# Patient Record
Sex: Female | Born: 1965 | Race: Black or African American | Hispanic: No | Marital: Single | Smoking: Never smoker
Health system: Southern US, Community
[De-identification: ages and names within clinical notes are randomized; demographics above are authoritative.]

## PROBLEM LIST (undated history)

## (undated) ENCOUNTER — Emergency Department (HOSPITAL_COMMUNITY): Admission: EM

## (undated) DIAGNOSIS — R7303 Prediabetes: Secondary | ICD-10-CM

## (undated) DIAGNOSIS — M1990 Unspecified osteoarthritis, unspecified site: Secondary | ICD-10-CM

## (undated) DIAGNOSIS — K219 Gastro-esophageal reflux disease without esophagitis: Secondary | ICD-10-CM

## (undated) DIAGNOSIS — Z9289 Personal history of other medical treatment: Secondary | ICD-10-CM

## (undated) DIAGNOSIS — R002 Palpitations: Secondary | ICD-10-CM

## (undated) DIAGNOSIS — E785 Hyperlipidemia, unspecified: Secondary | ICD-10-CM

## (undated) DIAGNOSIS — I10 Essential (primary) hypertension: Secondary | ICD-10-CM

## (undated) DIAGNOSIS — E669 Obesity, unspecified: Secondary | ICD-10-CM

## (undated) DIAGNOSIS — G4730 Sleep apnea, unspecified: Secondary | ICD-10-CM

## (undated) DIAGNOSIS — F419 Anxiety disorder, unspecified: Secondary | ICD-10-CM

## (undated) HISTORY — DX: Prediabetes: R73.03

## (undated) HISTORY — PX: CHOLECYSTECTOMY: SHX55

## (undated) HISTORY — PX: CARDIAC SURGERY: SHX584

## (undated) HISTORY — DX: Personal history of other medical treatment: Z92.89

## (undated) HISTORY — DX: Obesity, unspecified: E66.9

## (undated) HISTORY — PX: DOPPLER ECHOCARDIOGRAPHY: SHX263

## (undated) HISTORY — DX: Palpitations: R00.2

---

## 2000-07-15 ENCOUNTER — Ambulatory Visit (HOSPITAL_COMMUNITY): Admission: RE | Admit: 2000-07-15 | Discharge: 2000-07-15 | Payer: Self-pay | Admitting: Obstetrics and Gynecology

## 2000-07-18 ENCOUNTER — Observation Stay (HOSPITAL_COMMUNITY): Admission: AD | Admit: 2000-07-18 | Discharge: 2000-07-18 | Payer: Self-pay | Admitting: *Deleted

## 2000-07-22 ENCOUNTER — Inpatient Hospital Stay (HOSPITAL_COMMUNITY): Admission: AD | Admit: 2000-07-22 | Discharge: 2000-07-25 | Payer: Self-pay | Admitting: Obstetrics and Gynecology

## 2000-07-28 ENCOUNTER — Emergency Department (HOSPITAL_COMMUNITY): Admission: EM | Admit: 2000-07-28 | Discharge: 2000-07-28 | Payer: Self-pay | Admitting: Emergency Medicine

## 2000-07-28 ENCOUNTER — Encounter: Payer: Self-pay | Admitting: Emergency Medicine

## 2000-08-14 ENCOUNTER — Emergency Department (HOSPITAL_COMMUNITY): Admission: EM | Admit: 2000-08-14 | Discharge: 2000-08-14 | Payer: Self-pay | Admitting: *Deleted

## 2000-12-09 ENCOUNTER — Emergency Department (HOSPITAL_COMMUNITY): Admission: EM | Admit: 2000-12-09 | Discharge: 2000-12-09 | Payer: Self-pay | Admitting: *Deleted

## 2001-07-10 ENCOUNTER — Encounter: Payer: Self-pay | Admitting: Obstetrics and Gynecology

## 2001-07-10 ENCOUNTER — Ambulatory Visit (HOSPITAL_COMMUNITY): Admission: RE | Admit: 2001-07-10 | Discharge: 2001-07-10 | Payer: Self-pay | Admitting: Internal Medicine

## 2001-11-25 ENCOUNTER — Ambulatory Visit (HOSPITAL_COMMUNITY): Admission: RE | Admit: 2001-11-25 | Discharge: 2001-11-25 | Payer: Self-pay | Admitting: Cardiology

## 2001-11-25 ENCOUNTER — Encounter: Payer: Self-pay | Admitting: Cardiology

## 2002-01-20 ENCOUNTER — Emergency Department (HOSPITAL_COMMUNITY): Admission: EM | Admit: 2002-01-20 | Discharge: 2002-01-20 | Payer: Self-pay | Admitting: *Deleted

## 2002-06-30 ENCOUNTER — Ambulatory Visit (HOSPITAL_COMMUNITY): Admission: RE | Admit: 2002-06-30 | Discharge: 2002-06-30 | Payer: Self-pay | Admitting: Obstetrics & Gynecology

## 2003-06-15 ENCOUNTER — Emergency Department (HOSPITAL_COMMUNITY): Admission: EM | Admit: 2003-06-15 | Discharge: 2003-06-15 | Payer: Self-pay | Admitting: Emergency Medicine

## 2004-01-03 ENCOUNTER — Emergency Department (HOSPITAL_COMMUNITY): Admission: EM | Admit: 2004-01-03 | Discharge: 2004-01-04 | Payer: Self-pay | Admitting: *Deleted

## 2004-06-03 ENCOUNTER — Emergency Department (HOSPITAL_COMMUNITY): Admission: EM | Admit: 2004-06-03 | Discharge: 2004-06-03 | Payer: Self-pay | Admitting: Emergency Medicine

## 2004-10-18 ENCOUNTER — Emergency Department (HOSPITAL_COMMUNITY): Admission: EM | Admit: 2004-10-18 | Discharge: 2004-10-18 | Payer: Self-pay | Admitting: Emergency Medicine

## 2005-06-03 ENCOUNTER — Emergency Department (HOSPITAL_COMMUNITY): Admission: EM | Admit: 2005-06-03 | Discharge: 2005-06-03 | Payer: Self-pay | Admitting: Emergency Medicine

## 2005-09-02 ENCOUNTER — Emergency Department (HOSPITAL_COMMUNITY): Admission: EM | Admit: 2005-09-02 | Discharge: 2005-09-02 | Payer: Self-pay | Admitting: Emergency Medicine

## 2005-10-03 ENCOUNTER — Ambulatory Visit (HOSPITAL_COMMUNITY): Admission: RE | Admit: 2005-10-03 | Discharge: 2005-10-03 | Payer: Self-pay | Admitting: Family Medicine

## 2006-01-21 ENCOUNTER — Emergency Department: Payer: Self-pay | Admitting: Internal Medicine

## 2006-03-30 ENCOUNTER — Emergency Department (HOSPITAL_COMMUNITY): Admission: EM | Admit: 2006-03-30 | Discharge: 2006-03-30 | Payer: Self-pay | Admitting: Emergency Medicine

## 2006-04-09 HISTORY — PX: OOPHORECTOMY: SHX86

## 2006-07-24 ENCOUNTER — Emergency Department (HOSPITAL_COMMUNITY): Admission: EM | Admit: 2006-07-24 | Discharge: 2006-07-24 | Payer: Self-pay | Admitting: Emergency Medicine

## 2006-12-25 ENCOUNTER — Emergency Department (HOSPITAL_COMMUNITY): Admission: EM | Admit: 2006-12-25 | Discharge: 2006-12-25 | Payer: Self-pay | Admitting: Emergency Medicine

## 2007-04-15 ENCOUNTER — Other Ambulatory Visit: Admission: RE | Admit: 2007-04-15 | Discharge: 2007-04-15 | Payer: Self-pay | Admitting: Obstetrics and Gynecology

## 2009-04-25 ENCOUNTER — Emergency Department (HOSPITAL_COMMUNITY): Admission: EM | Admit: 2009-04-25 | Discharge: 2009-04-25 | Payer: Self-pay | Admitting: Emergency Medicine

## 2009-10-23 ENCOUNTER — Emergency Department (HOSPITAL_COMMUNITY): Admission: EM | Admit: 2009-10-23 | Discharge: 2009-10-23 | Payer: Self-pay | Admitting: Emergency Medicine

## 2010-03-04 ENCOUNTER — Emergency Department (HOSPITAL_COMMUNITY): Admission: EM | Admit: 2010-03-04 | Discharge: 2010-03-04 | Payer: Self-pay | Admitting: Emergency Medicine

## 2010-06-20 LAB — URINE CULTURE

## 2010-06-20 LAB — URINALYSIS, ROUTINE W REFLEX MICROSCOPIC
Bilirubin Urine: NEGATIVE
Glucose, UA: NEGATIVE mg/dL
Leukocytes, UA: NEGATIVE
Specific Gravity, Urine: 1.025 (ref 1.005–1.030)
Urobilinogen, UA: 0.2 mg/dL (ref 0.0–1.0)
pH: 6 (ref 5.0–8.0)

## 2010-06-20 LAB — CBC
HCT: 36.1 % (ref 36.0–46.0)
RBC: 4.13 MIL/uL (ref 3.87–5.11)

## 2010-06-20 LAB — COMPREHENSIVE METABOLIC PANEL
ALT: 13 U/L (ref 0–35)
AST: 21 U/L (ref 0–37)
BUN: 13 mg/dL (ref 6–23)
Calcium: 9.5 mg/dL (ref 8.4–10.5)
GFR calc Af Amer: >60 mL/min (ref >60)
Glucose, Bld: 93 mg/dL (ref 70–99)
Total Protein: 7.5 g/dL (ref 6.0–8.3)

## 2010-06-20 LAB — URINE MICROSCOPIC-ADD ON

## 2010-06-20 LAB — DIFFERENTIAL
Monocytes Absolute: 0.6 10*3/uL (ref 0.1–1.0)
Neutro Abs: 6.7 10*3/uL (ref 1.7–7.7)
Neutrophils Relative %: 65 % (ref 43–77)

## 2010-06-20 LAB — LIPASE, BLOOD: Lipase: 26 U/L (ref 11–59)

## 2010-06-24 LAB — POCT CARDIAC MARKERS
CKMB, poc: <1 ng/mL — ABNORMAL LOW (ref 1.0–8.0)
Myoglobin, poc: 86.1 ng/mL (ref 12–200)
Troponin i, poc: <0.05 ng/mL (ref 0.00–0.09)

## 2010-06-24 LAB — BASIC METABOLIC PANEL
BUN: 9 mg/dL (ref 6–23)
Chloride: 105 mEq/L (ref 96–112)
GFR calc Af Amer: >60 mL/min (ref >60)
Glucose, Bld: 98 mg/dL (ref 70–99)
Potassium: 3.7 mEq/L (ref 3.5–5.1)

## 2010-06-24 LAB — DIFFERENTIAL
Eosinophils Absolute: 0.3 10*3/uL (ref 0.0–0.7)
Eosinophils Relative: 3 % (ref 0–5)
Lymphs Abs: 2.2 10*3/uL (ref 0.7–4.0)
Monocytes Absolute: 0.6 10*3/uL (ref 0.1–1.0)
Monocytes Relative: 5 % (ref 3–12)
Neutro Abs: 8.1 10*3/uL — ABNORMAL HIGH (ref 1.7–7.7)

## 2010-06-24 LAB — CBC
HCT: 34.4 % — ABNORMAL LOW (ref 36.0–46.0)
Hemoglobin: 11.8 g/dL — ABNORMAL LOW (ref 12.0–15.0)
MCV: 89.8 fL (ref 78.0–100.0)
RBC: 3.83 MIL/uL — ABNORMAL LOW (ref 3.87–5.11)

## 2010-07-14 ENCOUNTER — Emergency Department (HOSPITAL_COMMUNITY)
Admission: EM | Admit: 2010-07-14 | Discharge: 2010-07-14 | Disposition: A | Discharge status: Home / Self Care | Payer: PRIVATE HEALTH INSURANCE | Attending: Emergency Medicine | Admitting: Emergency Medicine

## 2010-07-14 ENCOUNTER — Emergency Department (HOSPITAL_COMMUNITY): Payer: PRIVATE HEALTH INSURANCE

## 2010-07-14 DIAGNOSIS — Z79899 Other long term (current) drug therapy: Secondary | ICD-10-CM | POA: Insufficient documentation

## 2010-07-14 DIAGNOSIS — IMO0002 Reserved for concepts with insufficient information to code with codable children: Secondary | ICD-10-CM | POA: Insufficient documentation

## 2010-07-14 DIAGNOSIS — S8000XA Contusion of unspecified knee, initial encounter: Secondary | ICD-10-CM | POA: Insufficient documentation

## 2010-07-14 DIAGNOSIS — W1809XA Striking against other object with subsequent fall, initial encounter: Secondary | ICD-10-CM | POA: Insufficient documentation

## 2010-07-14 DIAGNOSIS — I10 Essential (primary) hypertension: Secondary | ICD-10-CM | POA: Insufficient documentation

## 2010-07-14 DIAGNOSIS — E7800 Pure hypercholesterolemia, unspecified: Secondary | ICD-10-CM | POA: Insufficient documentation

## 2010-07-14 DIAGNOSIS — K219 Gastro-esophageal reflux disease without esophagitis: Secondary | ICD-10-CM | POA: Insufficient documentation

## 2010-07-14 DIAGNOSIS — Z7982 Long term (current) use of aspirin: Secondary | ICD-10-CM | POA: Insufficient documentation

## 2010-07-14 DIAGNOSIS — Y999 Unspecified external cause status: Secondary | ICD-10-CM | POA: Insufficient documentation

## 2010-07-14 DIAGNOSIS — Y92009 Unspecified place in unspecified non-institutional (private) residence as the place of occurrence of the external cause: Secondary | ICD-10-CM | POA: Insufficient documentation

## 2010-07-14 DIAGNOSIS — F341 Dysthymic disorder: Secondary | ICD-10-CM | POA: Insufficient documentation

## 2010-08-25 NOTE — Cardiovascular Report (Signed)
NAME:  Summer Carpenter, Summer Carpenter NO.:  [id number]   MEDICAL RECORD NO.:  [id number]                   PATIENT TYPE:  OIB   LOCATION:  2899                                 FACILITY:  [facility]   PHYSICIAN:  Madaline Savage, M.D.             DATE OF BIRTH:  06-Feb-1966   DATE OF PROCEDURE:  11/25/2001  DATE OF DISCHARGE:                              CARDIAC CATHETERIZATION   PROCEDURES PERFORMED:  1. Selective coronary angiography by Judkins technique.  2. Retrograde left heart catheterization.  3. Left ventricular angiography.  4. Abdominal aortography.   ENTRY SITE:  Right femoral.   DYE USED:  Omnipaque.   MEDICATIONS GIVEN:  Versed 1 mg IV, fentanyl 25 mg IV given for sedation.   PATIENT PROFILE:  The patient is a 45 year old African American female with  hypertension and mild obesity.  An outpatient Cardiolite done October 22, 2001,  showed  an area of ischemia in the anterior anteroseptal wall from base to  apex.  Left ventricular ejection fraction was noted to be 40% at that time.  The patient was appraised of the results and wished to undergo diagnostic  cardiac catheterization because of her fear of having coronary disease.   Today's procedure is performed on an outpatient basis electively.   RESULTS:  PRESSURES:  The left ventricular pressure was 130/7 with an end-diastolic  pressure of 25.  Central aortic pressure was 130/75 with a mean of 105.  No  aortic valve gradient by pullback technique.   ANGIOGRAPHIC RESULTS:  There were no pericardial or valvular cardiac  calcifications nor coronary calcifications.   The left main coronary artery is a large and normal vessel.   The left anterior descending coronary artery gives rise to a very proximal  first diagonal branch that is small to medium in size with no lesions.  The  LAD courses to the cardiac apex and wraps around the apex and is a very  large long vessel containing no lesions.   The left  circumflex gives rise to two obtuse marginal branches, the first is  a large single OM that is normal. The second is a bifurcating OM-2, which is  also normal and the circumflex itself, which is dominant along with the LAD  shows no evidence of any lesions.   The right coronary artery is a small vessel giving rise to one long acute  marginal branch and a very small posterior descending branch and  posterolateral branch.  No lesions are seen.   LEFT VENTRICULOGRAM: The left ventricular angiogram shows normal  contractility. All wall segments move normally and ejection fraction is  noted to be 60%. There is no mitral regurgitation or LV thrombosis noted.   ABDOMINAL AORTOGRAM:  Abdominal aortography shows that the abdominal aorta  is smooth and normal. Both renal arteries are single and normal in  appearance.   FINAL DIAGNOSES:  1. Angiographically patent coronary arteries with a left dominant system.  2. Normal left ventricular systolic function.  3. Normal abdominal aorta.  4. Both renal arteries are angiographically patent.   PLAN:  Continued treatment of the patient's system hypertension, reassurance  about chest pain and continuation of antihypertensive medication.                                                    Madaline Savage, M.D.    WHG/MEDQ  D:  11/25/2001  T:  11/26/2001  Job:  [id number]   cc:   [facility]   Melvern Banker, M.D.  [facility]

## 2010-08-25 NOTE — Op Note (Signed)
NAME:  Summer Carpenter, Summer Carpenter                   ACCOUNT NO.:  [id number]   MEDICAL RECORD NO.:  [id number]                   PATIENT TYPE:  AMB   LOCATION:  DAY                                  FACILITY:  [facility]   PHYSICIAN:  Lazaro Arms, M.D.                DATE OF BIRTH:  [birth year]   DATE OF PROCEDURE:  06/30/2002  DATE OF DISCHARGE:                                 OPERATIVE REPORT   PREOPERATIVE DIAGNOSES:  1. Persistent left lower quadrant pain for three years.  2. History of left ovarian cyst.   POSTOPERATIVE DIAGNOSES:  1. Persistent left lower quadrant pain for three years.  2. History of left ovarian cyst.   PROCEDURE:  Laparoscopic left salpingo-oophorectomy.   SURGEON:  Lazaro Arms, M.D.   ANESTHESIA:  General endotracheal.   FINDINGS:  The patient had been seen in the office numerous times over the  years.  She has persistent left lower quadrant pain.  She has no GI  symptoms, no GU symptoms.  Ultrasounds have been normal.  She has had cysts  on and off on the left ovary, but she continues to have pain on that side,  and her examination is consistent with left ovarian pain.  She has no  uterine pain, no period problems, and the right adnexa is without symptoms.   DESCRIPTION OF PROCEDURE:  The patient was taken to the operating room and  underwent general endotracheal anesthesia.  She was placed in the dorsal  lithotomy position and prepped and draped in the usual sterile fashion.  Her  vagina was also prepped, and a Foley catheter was placed.   A Hulka tenaculum was placed into the uterus for uterine manipulation.  An  incision was made in the umbilicus, and an open laparoscopy was performed.  The trocar was placed into the peritoneal cavity with one pass without  difficulty and done under direct visualization.  The abdomen was then  insufflated.  The peritoneal cavity was viewed.  The uterus, ovaries, and  upper abdomen were all viewed and found to be  normal.  Two additional 5-mm  trocars were placed, one two fingerbreadths above the pubis and one in the  right lower quadrant, both under direct visualization without difficulty.  The left adnexa was then grasped, and the harmonic scalpel was used.  The  infundibulopelvic ligament was then taken down.  The tube and ovary were  removed in this manner, with good hemostasis.  A 5-mm scope was placed in  the right lower quadrant.  An EndoCatch bag was placed, and the ovary and  tube were removed.  The pelvis was irrigated.  There was just some slight  oozing from the pedicle.  It probably was not anything significant, but I  used the harmonic scalpel and coagulated it further, and that went well.  Pressure was taken off, and the pedicles were found to be hemostatic.  The  pelvis was  once again irrigated.  The instruments were removed.  The gas was  allowed to escape.  The umbilical fascia was closed with a single 0 Vicryl  suture, and the skin was closed using skin staples.   The patient tolerated the procedure well.  She was awakened from anesthesia  and taken to the recovery room in good and stable condition.  All counts  were correct x3.  She experienced minimal blood loss.  The specimen was sent  to the lab.  Marcaine 0.5% with 1:200,000 epinephrine was injected in each  incision site.  The patient was given Toradol in the recovery room.  She  will be seen back next week for followup.                                               Lazaro Arms, M.D.    Loraine Maple  D:  06/30/2002  T:  06/30/2002  Job:  [id number]

## 2010-08-25 NOTE — H&P (Signed)
NAME:  Summer Carpenter, Summer Carpenter NO.:  [id number]   MEDICAL RECORD NO.:  [id number]                   PATIENT TYPE:  AMB   LOCATION:  DAY                                  FACILITY:  [facility]   PHYSICIAN:  Lazaro Arms, M.D.                DATE OF BIRTH:  05-08-1965   DATE OF ADMISSION:  DATE OF DISCHARGE:                                HISTORY & PHYSICAL   HISTORY OF PRESENT ILLNESS:  The patient is a 45 year old female, gravida 6,  para 3, with a last menstrual period of December 2003, who presented to me  on 06/19/02 complaining of persistent left lower quadrant pain and left side  pain.  This has been present for approximately three years.  It comes and  goes in the past but now it is constant, dull, achy pain.  She denies any  pain with intercourse.  Her bowel movements are normal.  She denies any  gastrointestinal complaints and no urinary symptoms.  Her periods are  somewhat irregular but with five to six days of bleeding, moderate, normal  amount of bleeding for her.  On examination in the office, she had pain on  the left side, no cervical motion tenderness.  The right adnexa was  negative.   PAST MEDICAL HISTORY:  Hypertension.   PAST SURGICAL HISTORY:  Cold knife cone.   PAST OBSTETRICAL HISTORY:  Three vaginal deliveries and three pregnancy  losses.   ALLERGIES:  No known drug allergies.   MEDICATIONS:  Her only medication is Atacand for her hypertension.   PHYSICAL EXAMINATION:  VITAL SIGNS:  Her weight is 231 pounds, her height is  5 feet 8 inches.  Her blood pressure today in the office is 130/80.  HEENT:  Unremarkable.  NECK:  Thyroid is normal.  CHEST:  Lungs are clear.  CARDIAC:  Heart is regular rate and rhythm without murmur, rub, or gallop.  BREASTS:  Deferred.  ABDOMEN:  Abdomen in the office on 06/19/02 was benign.  PELVIC:  She has normal external genitalia.  Vagina clean without discharge.  Cervix parous without lesions.  Uterus  normal consistency and contour.  Right adnexa is negative, no cervical motion tenderness.  The left adnexa  was tender.  EXTREMITIES:  Warm with no edema.  NEUROLOGIC:  Grossly intact.   IMPRESSION:  1. Persistent left lower quadrant pain.  2. History of left ovarian cyst.    PLAN:  The patient is admitted for a laparoscopic left salpingo-  oophorectomy.  She understands the risks, benefits, indications, and  alternatives, and will proceed.                                               Lazaro Arms, M.D.    Loraine Maple  D:  06/29/2002  T:  06/29/2002  Job:  [id number]

## 2010-10-31 ENCOUNTER — Other Ambulatory Visit (HOSPITAL_COMMUNITY): Payer: Self-pay | Admitting: Family Medicine

## 2010-10-31 DIAGNOSIS — R922 Inconclusive mammogram: Secondary | ICD-10-CM

## 2010-11-01 ENCOUNTER — Ambulatory Visit (HOSPITAL_COMMUNITY)
Admission: RE | Admit: 2010-11-01 | Discharge: 2010-11-01 | Disposition: A | Discharge status: Home / Self Care | Payer: PRIVATE HEALTH INSURANCE | Source: Ambulatory Visit | Attending: Family Medicine | Admitting: Family Medicine

## 2010-11-01 ENCOUNTER — Other Ambulatory Visit (HOSPITAL_COMMUNITY): Payer: Self-pay | Admitting: Family Medicine

## 2010-11-01 DIAGNOSIS — R922 Inconclusive mammogram: Secondary | ICD-10-CM

## 2010-11-01 DIAGNOSIS — N63 Unspecified lump in unspecified breast: Secondary | ICD-10-CM | POA: Insufficient documentation

## 2010-11-18 ENCOUNTER — Other Ambulatory Visit: Payer: Self-pay

## 2010-11-18 ENCOUNTER — Emergency Department (HOSPITAL_COMMUNITY)
Admission: EM | Admit: 2010-11-18 | Discharge: 2010-11-18 | Disposition: A | Discharge status: Home / Self Care | Payer: PRIVATE HEALTH INSURANCE | Attending: Emergency Medicine | Admitting: Emergency Medicine

## 2010-11-18 DIAGNOSIS — Z7982 Long term (current) use of aspirin: Secondary | ICD-10-CM | POA: Insufficient documentation

## 2010-11-18 DIAGNOSIS — E785 Hyperlipidemia, unspecified: Secondary | ICD-10-CM | POA: Insufficient documentation

## 2010-11-18 DIAGNOSIS — Z79899 Other long term (current) drug therapy: Secondary | ICD-10-CM | POA: Insufficient documentation

## 2010-11-18 DIAGNOSIS — I10 Essential (primary) hypertension: Secondary | ICD-10-CM | POA: Insufficient documentation

## 2010-11-18 DIAGNOSIS — R002 Palpitations: Secondary | ICD-10-CM

## 2010-11-18 DIAGNOSIS — R0789 Other chest pain: Secondary | ICD-10-CM | POA: Insufficient documentation

## 2010-11-18 HISTORY — DX: Hyperlipidemia, unspecified: E78.5

## 2010-11-18 HISTORY — DX: Anxiety disorder, unspecified: F41.9

## 2010-11-18 HISTORY — DX: Essential (primary) hypertension: I10

## 2010-11-18 LAB — CBC
HCT: 37.2 % (ref 36.0–46.0)
Hemoglobin: 12.6 g/dL (ref 12.0–15.0)
MCH: 30.2 pg (ref 26.0–34.0)
MCHC: 33.9 g/dL (ref 30.0–36.0)
MCV: 89.2 fL (ref 78.0–100.0)
Platelets: 352 10*3/uL (ref 150–400)
RBC: 4.17 MIL/uL (ref 3.87–5.11)
RDW: 14.4 % (ref 11.5–15.5)
WBC: 9.4 10*3/uL (ref 4.0–10.5)

## 2010-11-18 LAB — DIFFERENTIAL
Basophils Absolute: 0 10*3/uL (ref 0.0–0.1)
Basophils Relative: 0 % (ref 0–1)
Eosinophils Absolute: 0.3 10*3/uL (ref 0.0–0.7)
Eosinophils Relative: 3 % (ref 0–5)
Lymphocytes Relative: 29 % (ref 12–46)
Lymphs Abs: 2.7 10*3/uL (ref 0.7–4.0)
Monocytes Absolute: 0.8 10*3/uL (ref 0.1–1.0)
Monocytes Relative: 8 % (ref 3–12)
Neutro Abs: 5.6 10*3/uL (ref 1.7–7.7)
Neutrophils Relative %: 60 % (ref 43–77)

## 2010-11-18 LAB — BASIC METABOLIC PANEL
BUN: 13 mg/dL (ref 6–23)
CO2: 24 mEq/L (ref 19–32)
Calcium: 9.6 mg/dL (ref 8.4–10.5)
Chloride: 100 mEq/L (ref 96–112)
Creatinine, Ser: 0.97 mg/dL (ref 0.50–1.10)
GFR calc Af Amer: >60 mL/min (ref >60)
GFR calc non Af Amer: >60 mL/min (ref >60)
Glucose, Bld: 111 mg/dL — ABNORMAL HIGH (ref 70–99)
Potassium: 3.1 mEq/L — ABNORMAL LOW (ref 3.5–5.1)
Sodium: 140 mEq/L (ref 135–145)

## 2010-11-18 LAB — POCT I-STAT TROPONIN I

## 2010-11-18 MED ORDER — LORAZEPAM 1 MG PO TABS: 1.0000 mg | ORAL_TABLET | Freq: Three times a day (TID) | ORAL | Status: AC

## 2010-11-18 NOTE — ED Provider Notes (Signed)
Scribed for Donnetta Hutching, MD, the patient was seen in room 10. This chart was scribed by Jannette Fogo. This patient's care was started at 10:05.   CSN: [id number] Arrival date & time: 11/18/2010  9:21 AM  Chief Complaint  Patient presents with  . Chest Pain    HPI Summer Carpenter is a 45 y.o. female with a history of hypertension and hyperlipidemia who presents to the Emergency Department complaining of 6 days of intermittent palpitations. Patient states she developed chest palpitations on Monday 11/13/10. Since she has had an intermittent fluttering sensation in her chest "that feels like butterflies", and last ~3 seconds then resolves. Patient reports ~10 episodes of palpitations per day but denies any alleviating or aggravating factors. She denies any associated chest pain, shortness of breath, diaphoresis, nausea, vomiting, or pedal edema.  Patient has a history of similar symptoms in the past associated with anxiety. She states 3 weeks ago her Zoloft was reduced from 100mg  to 50mg , but then again increased to 100mg  this week. Patient reports drinking more caffeine when the Zoloft was decreased. She denies a history of diabetes mellitus. Denies family history of cardiac disease. There are no other associated symptoms and no other alleviating or aggravating factors.     Past Medical History  Diagnosis Date  . Hypertension   . Anxiety   . Hyperlipidemia     Past Surgical History  Procedure Date  . Cholecystectomy   Cold knife cone   MEDICATIONS:  Previous Medications   ASPIRIN EC 81 MG TABLET    Take 81 mg by mouth daily.     HYDROCHLOROTHIAZIDE 25 MG TABLET    Take 25 mg by mouth daily.     IBUPROFEN (ADVIL,MOTRIN) 200 MG TABLET    Take 200 mg by mouth as needed. For pain    LEVOCETIRIZINE (XYZAL) 5 MG TABLET    Take 5 mg by mouth every evening.     LOSARTAN (COZAAR) 25 MG TABLET    Take 25 mg by mouth daily.     OMEPRAZOLE (PRILOSEC) 40 MG CAPSULE    Take 40 mg by mouth  daily.     PROPRANOLOL (INDERAL) 40 MG TABLET    Take 40 mg by mouth at bedtime.     SERTRALINE (ZOLOFT) 100 MG TABLET    Take 100 mg by mouth at bedtime.     SIMVASTATIN (ZOCOR) 10 MG TABLET    Take 10 mg by mouth at bedtime.       ALLERGIES:  Allergies as of 11/18/2010 - Review Complete 11/18/2010  Allergen Reaction Noted  . Septra (bactrim) Hives and Diarrhea 11/18/2010    FAMILY HISTORY:  Denies cardiac disease   History  Substance Use Topics  . Smoking status: Never Smoker   . Smokeless tobacco: Not on file  . Alcohol Use: No    Review of Systems  Constitutional: Negative for diaphoresis.  Respiratory: Negative for shortness of breath.   Cardiovascular: Positive for palpitations. Negative for chest pain and leg swelling.  Gastrointestinal: Negative for nausea and vomiting.  All other systems reviewed and are negative.    Physical Exam  BP 118/67  Pulse 78  Temp(Src) 97.8 F (36.6 C) (Oral)  Resp 16  Ht 5\' 8"  (1.727 m)  Wt 240 lb (108.863 kg)  BMI 36.49 kg/m2  SpO2 98%  LMP 11/18/2010  Physical Exam  Constitutional: She is oriented to person, place, and time. She appears well-developed and well-nourished. No distress.  HENT:  Head: Normocephalic  and atraumatic.  Mouth/Throat: Oropharynx is clear and moist.  Eyes: Conjunctivae are normal. Pupils are equal, round, and reactive to light.  Neck: Normal range of motion. Neck supple.  Cardiovascular: Normal rate and regular rhythm.   No murmur heard. Pulmonary/Chest: Effort normal and breath sounds normal.  Abdominal: Soft. Bowel sounds are normal. She exhibits no distension. There is no tenderness.  Musculoskeletal: Normal range of motion. She exhibits no edema and no tenderness.  Neurological: She is alert and oriented to person, place, and time. No cranial nerve deficit. Coordination normal.  Skin: Skin is warm and dry. No rash noted.  Psychiatric: She has a normal mood and affect.    ED Course   Procedures  OTHER DATA REVIEWED: Nursing notes, vital signs, and past medical records reviewed.    DIAGNOSTIC STUDIES: Oxygen Saturation is 99% on room air, normal by my interpretation.    CARDIAC MONITOR: normal sinus rhythm with a rate of 85 BPM. No ectopy.   EKG:  Date: 11/18/2010  Rate: 89  Rhythm: normal sinus rhythm  QRS Axis: normal  Intervals: QT prolonged  ST/T Wave abnormalities: normal  Conduction Disutrbances:incomplete RBBB  Narrative Interpretation:   Old EKG Reviewed: none available  LABS / RADIOLOGY:  Results for orders placed during the hospital encounter of 11/18/10  CBC      Component Value Range   WBC 9.4  4.0 - 10.5 (K/uL)   RBC 4.17  3.87 - 5.11 (MIL/uL)   Hemoglobin 12.6  12.0 - 15.0 (g/dL)   HCT 16.1  09.6 - 04.5 (%)   MCV 89.2  78.0 - 100.0 (fL)   MCH 30.2  26.0 - 34.0 (pg)   MCHC 33.9  30.0 - 36.0 (g/dL)   RDW 40.9  81.1 - 91.4 (%)   Platelets 352  150 - 400 (K/uL)  DIFFERENTIAL      Component Value Range   Neutrophils Relative 60  43 - 77 (%)   Neutro Abs 5.6  1.7 - 7.7 (K/uL)   Lymphocytes Relative 29  12 - 46 (%)   Lymphs Abs 2.7  0.7 - 4.0 (K/uL)   Monocytes Relative 8  3 - 12 (%)   Monocytes Absolute 0.8  0.1 - 1.0 (K/uL)   Eosinophils Relative 3  0 - 5 (%)   Eosinophils Absolute 0.3  0.0 - 0.7 (K/uL)   Basophils Relative 0  0 - 1 (%)   Basophils Absolute 0.0  0.0 - 0.1 (K/uL)  BASIC METABOLIC PANEL      Component Value Range   Sodium 140  135 - 145 (mEq/L)   Potassium 3.1 (*) 3.5 - 5.1 (mEq/L)   Chloride 100  96 - 112 (mEq/L)   CO2 24  19 - 32 (mEq/L)   Glucose, Bld 111 (*) 70 - 99 (mg/dL)   BUN 13  6 - 23 (mg/dL)   Creatinine, Ser 7.82  0.50 - 1.10 (mg/dL)   Calcium 9.6  8.4 - 95.6 (mg/dL)   GFR calc non Af Amer >60  >60 (mL/min)   GFR calc Af Amer >60  >60 (mL/min)  POCT I-STAT TROPONIN I      Component Value Range   Troponin i, poc 0.00  0.00 - 0.08 (ng/mL)   Comment 3           POCT I-STAT TROPONIN I      Component  Value Range   Troponin i, poc 0.00  0.00 - 0.08 (ng/mL)   Comment 3  ED COURSE / COORDINATION OF CARE: 10:09 -  ED physician discussed possibility of PVCs with patient but cardiac monitor shows normal sinus rhythm with rate 85 bpm. Basic lab work and Ativan will be given. Patient will be discharged with PO Ativan.       MDM:low risk for CAD  Patient will be discharged with PO Ativan.     IMPRESSION: Palpitations    PLAN:  Home  The patient is to return the emergency department if there is any worsening of symptoms. I have reviewed the discharge instructions with the patient.    CONDITION ON DISCHARGE: Stable   MEDICATIONS GIVEN IN THE E.D.  Medications  simvastatin (ZOCOR) 10 MG tablet (not administered)  propranolol (INDERAL) 40 MG tablet (not administered)  losartan (COZAAR) 25 MG tablet (not administered)  aspirin EC 81 MG tablet (not administered)  sertraline (ZOLOFT) 100 MG tablet (not administered)  hydrochlorothiazide 25 MG tablet (not administered)  omeprazole (PRILOSEC) 40 MG capsule (not administered)  levocetirizine (XYZAL) 5 MG tablet (not administered)  ibuprofen (ADVIL,MOTRIN) 200 MG tablet (not administered)  LORazepam (ATIVAN) 1 MG tablet (not administered)     DISCHARGE MEDICATIONS: New Prescriptions   LORAZEPAM (ATIVAN) 1 MG TABLET    Take 1 tablet (1 mg total) by mouth every 8 (eight) hours.   I personally performed the services described in this documentation, which was scribed in my presence. The recorded information has been reviewed and considered. No att. providers found   I personally performed the services described in this documentation, which was scribed in my presence. The recorded information has been reviewed and considered. Donnetta Hutching, MD  Donnetta Hutching, MD 11/20/10 [phone]

## 2010-11-18 NOTE — ED Notes (Signed)
Pt reports a hx of anxiety.  Pt states that she has been experiencing some mid-sternal chest discomfort at home.  Pt states that she has had this type of pain before, but it seems more frequent.  Pt denies any n/v, diaphoresis, or sob.  nad noted

## 2010-11-18 NOTE — ED Notes (Signed)
Report chest pain since Monday. Pt states heaviness last for about 1 min and then goes away. Pt states nothing relieves pain and unsure if anything is triggering the heaviness. Pt states it happens at different times of day. No radiation, no sob or n/v associated with heaviness.

## 2010-11-22 ENCOUNTER — Emergency Department: Payer: Self-pay | Admitting: Emergency Medicine

## 2011-09-16 ENCOUNTER — Encounter (HOSPITAL_COMMUNITY): Payer: Self-pay | Admitting: *Deleted

## 2011-09-16 ENCOUNTER — Emergency Department (HOSPITAL_COMMUNITY)
Admission: EM | Admit: 2011-09-16 | Discharge: 2011-09-16 | Disposition: A | Discharge status: Home / Self Care | Payer: PRIVATE HEALTH INSURANCE | Attending: Emergency Medicine | Admitting: Emergency Medicine

## 2011-09-16 ENCOUNTER — Emergency Department (HOSPITAL_COMMUNITY): Payer: PRIVATE HEALTH INSURANCE

## 2011-09-16 DIAGNOSIS — R110 Nausea: Secondary | ICD-10-CM | POA: Insufficient documentation

## 2011-09-16 DIAGNOSIS — R109 Unspecified abdominal pain: Secondary | ICD-10-CM

## 2011-09-16 DIAGNOSIS — E785 Hyperlipidemia, unspecified: Secondary | ICD-10-CM | POA: Insufficient documentation

## 2011-09-16 DIAGNOSIS — F411 Generalized anxiety disorder: Secondary | ICD-10-CM | POA: Insufficient documentation

## 2011-09-16 DIAGNOSIS — K219 Gastro-esophageal reflux disease without esophagitis: Secondary | ICD-10-CM | POA: Insufficient documentation

## 2011-09-16 DIAGNOSIS — I10 Essential (primary) hypertension: Secondary | ICD-10-CM | POA: Insufficient documentation

## 2011-09-16 HISTORY — DX: Gastro-esophageal reflux disease without esophagitis: K21.9

## 2011-09-16 LAB — COMPREHENSIVE METABOLIC PANEL
ALT: 14 U/L (ref 0–35)
Alkaline Phosphatase: 84 U/L (ref 39–117)
BUN: 14 mg/dL (ref 6–23)
CO2: 27 mEq/L (ref 19–32)
Calcium: 9.7 mg/dL (ref 8.4–10.5)
GFR calc Af Amer: 80 mL/min — ABNORMAL LOW (ref >90)
GFR calc non Af Amer: 69 mL/min — ABNORMAL LOW (ref >90)
Glucose, Bld: 108 mg/dL — ABNORMAL HIGH (ref 70–99)
Total Protein: 7.6 g/dL (ref 6.0–8.3)

## 2011-09-16 LAB — DIFFERENTIAL
Eosinophils Absolute: 0.3 10*3/uL (ref 0.0–0.7)
Eosinophils Relative: 3 % (ref 0–5)
Lymphocytes Relative: 29 % (ref 12–46)
Lymphs Abs: 3 10*3/uL (ref 0.7–4.0)
Monocytes Relative: 7 % (ref 3–12)

## 2011-09-16 LAB — URINALYSIS, ROUTINE W REFLEX MICROSCOPIC
Bilirubin Urine: NEGATIVE
Hgb urine dipstick: NEGATIVE
Nitrite: NEGATIVE
Protein, ur: 30 mg/dL — AB
Specific Gravity, Urine: 1.025 (ref 1.005–1.030)
Urobilinogen, UA: 0.2 mg/dL (ref 0.0–1.0)

## 2011-09-16 LAB — CBC
HCT: 35.8 % — ABNORMAL LOW (ref 36.0–46.0)
Hemoglobin: 11.9 g/dL — ABNORMAL LOW (ref 12.0–15.0)
MCH: 28.5 pg (ref 26.0–34.0)
MCV: 85.9 fL (ref 78.0–100.0)
RBC: 4.17 MIL/uL (ref 3.87–5.11)
WBC: 10.5 10*3/uL (ref 4.0–10.5)

## 2011-09-16 LAB — PREGNANCY, URINE: Preg Test, Ur: NEGATIVE

## 2011-09-16 LAB — URINE MICROSCOPIC-ADD ON

## 2011-09-16 LAB — LIPASE, BLOOD: Lipase: 34 U/L (ref 11–59)

## 2011-09-16 MED ORDER — DICYCLOMINE HCL 10 MG/ML IM SOLN
20.0000 mg | Freq: Once | INTRAMUSCULAR | Status: AC
  Administered 2011-09-16: 20 mg via INTRAMUSCULAR
  Filled 2011-09-16: qty 2

## 2011-09-16 MED ORDER — ONDANSETRON 8 MG PO TBDP
8.0000 mg | ORAL_TABLET | Freq: Once | ORAL | Status: AC
  Administered 2011-09-16: 8 mg via ORAL
  Filled 2011-09-16: qty 1

## 2011-09-16 MED ORDER — POTASSIUM CHLORIDE 20 MEQ/15ML (10%) PO LIQD
40.0000 meq | Freq: Once | ORAL | Status: AC
  Administered 2011-09-16: 40 meq via ORAL
  Filled 2011-09-16: qty 30

## 2011-09-16 MED ORDER — ONDANSETRON HCL 4 MG PO TABS: 4.0000 mg | ORAL_TABLET | Freq: Three times a day (TID) | ORAL | Status: AC | PRN

## 2011-09-16 MED ORDER — GI COCKTAIL ~~LOC~~
30.0000 mL | Freq: Once | ORAL | Status: AC
  Administered 2011-09-16: 30 mL via ORAL
  Filled 2011-09-16: qty 30

## 2011-09-16 NOTE — ED Provider Notes (Signed)
History     CSN: [id number]  Arrival date & time 09/16/11  1646   First MD Initiated Contact with Patient 09/16/11 1655      Chief Complaint  Patient presents with  . Nausea    HPI Pt was seen at 1700.   Per pt, c/o gradual onset and persistence of constant nausea for the past 1-2 weeks.  Pt states she was eval by her PMD this past week for same, rx phenergan without relief.  Pt states she took a laxative last night because she "thought I was constipated" and had BM today, but now is having generalized abd "cramps."  States she has been able to tolerate eating/drinking without vomiting.  Denies abd pain, no diarrhea, no CP/SOB, no back pain, no black or blood in stools, no fevers.      PMD:  [facility] MD in [place] Past Medical History  Diagnosis Date  . Hypertension   . Anxiety   . Hyperlipidemia   . GERD (gastroesophageal reflux disease)     Past Surgical History  Procedure Date  . Cholecystectomy     History  Substance Use Topics  . Smoking status: Never Smoker   . Smokeless tobacco: Not on file  . Alcohol Use: No    Review of Systems ROS: Statement: All systems negative except as marked or noted in the HPI; Constitutional: Negative for fever and chills. ; ; Eyes: Negative for eye pain, redness and discharge. ; ; ENMT: Negative for ear pain, hoarseness, nasal congestion, sinus pressure and sore throat. ; ; Cardiovascular: Negative for chest pain, palpitations, diaphoresis, dyspnea and peripheral edema. ; ; Respiratory: Negative for cough, wheezing and stridor. ; ; Gastrointestinal: +nausea, abd "cramps," constipation.  Negative for vomiting, diarrhea, abdominal pain, blood in stool, hematemesis, jaundice and rectal bleeding. . ; ; Genitourinary: Negative for dysuria, flank pain and hematuria. ; ; Musculoskeletal: Negative for back pain and neck pain. Negative for swelling and trauma.; ; Skin: Negative for pruritus, rash, abrasions, blisters, bruising and  skin lesion.; ; Neuro: Negative for headache, lightheadedness and neck stiffness. Negative for weakness, altered level of consciousness , altered mental status, extremity weakness, paresthesias, involuntary movement, seizure and syncope.     Allergies  Septra  Home Medications   Current Outpatient Rx  Name Route Sig Dispense Refill  . ASPIRIN EC 81 MG PO TBEC Oral Take 81 mg by mouth daily.      Marland Kitchen HYDROCHLOROTHIAZIDE 25 MG PO TABS Oral Take 25 mg by mouth daily.      . IBUPROFEN 200 MG PO TABS Oral Take 200 mg by mouth as needed. For pain     . LEVOCETIRIZINE DIHYDROCHLORIDE 5 MG PO TABS Oral Take 5 mg by mouth every evening.      Marland Kitchen LOSARTAN POTASSIUM 25 MG PO TABS Oral Take 25 mg by mouth daily.      Marland Kitchen OMEPRAZOLE 40 MG PO CPDR Oral Take 40 mg by mouth daily.      Marland Kitchen PROPRANOLOL HCL 40 MG PO TABS Oral Take 40 mg by mouth at bedtime.      . SERTRALINE HCL 100 MG PO TABS Oral Take 100 mg by mouth at bedtime.     Marland Kitchen SIMVASTATIN 10 MG PO TABS Oral Take 10 mg by mouth at bedtime.        BP 131/75  Pulse 94  Temp(Src) 97.9 F (36.6 C) (Oral)  Resp 18  Ht 5\' 8"  (1.727 m)  Wt 236 lb (  107.049 kg)  BMI 35.88 kg/m2  SpO2 99%  LMP 08/22/2011  Physical Exam 1705: Physical examination:  Nursing notes reviewed; Vital signs and O2 SAT reviewed;  Constitutional: Well developed, Well nourished, Well hydrated, In no acute distress; Head:  Normocephalic, atraumatic; Eyes: EOMI, PERRL, No scleral icterus; ENMT: Mouth and pharynx normal, Mucous membranes moist; Neck: Supple, Full range of motion, No lymphadenopathy; Cardiovascular: Regular rate and rhythm, No murmur, rub, or gallop; Respiratory: Breath sounds clear & equal bilaterally, No rales, rhonchi, wheezes, or rub, Normal respiratory effort/excursion; Chest: Nontender, Movement normal; Abdomen: Soft, Nontender, Nondistended, Normal bowel sounds; Genitourinary: No CVA tenderness; Extremities: Pulses normal, No tenderness, No edema, No calf edema or  asymmetry.; Neuro: AA&Ox3, Major CN grossly intact.  No gross focal motor or sensory deficits in extremities.; Skin: Color normal, Warm, Dry   ED Course  Procedures    MDM  MDM Reviewed: nursing note, previous chart and vitals Reviewed previous: labs Interpretation: labs and x-ray   Results for orders placed during the hospital encounter of 09/16/11  CBC      Component Value Range   WBC 10.5  4.0 - 10.5 (K/uL)   RBC 4.17  3.87 - 5.11 (MIL/uL)   Hemoglobin 11.9 (*) 12.0 - 15.0 (g/dL)   HCT 16.1 (*) 09.6 - 46.0 (%)   MCV 85.9  78.0 - 100.0 (fL)   MCH 28.5  26.0 - 34.0 (pg)   MCHC 33.2  30.0 - 36.0 (g/dL)   RDW 04.5  40.9 - 81.1 (%)   Platelets 340  150 - 400 (K/uL)  DIFFERENTIAL      Component Value Range   Neutrophils Relative 62  43 - 77 (%)   Neutro Abs 6.5  1.7 - 7.7 (K/uL)   Lymphocytes Relative 29  12 - 46 (%)   Lymphs Abs 3.0  0.7 - 4.0 (K/uL)   Monocytes Relative 7  3 - 12 (%)   Monocytes Absolute 0.7  0.1 - 1.0 (K/uL)   Eosinophils Relative 3  0 - 5 (%)   Eosinophils Absolute 0.3  0.0 - 0.7 (K/uL)   Basophils Relative 0  0 - 1 (%)   Basophils Absolute 0.0  0.0 - 0.1 (K/uL)  COMPREHENSIVE METABOLIC PANEL      Component Value Range   Sodium 138  135 - 145 (mEq/L)   Potassium 3.3 (*) 3.5 - 5.1 (mEq/L)   Chloride 100  96 - 112 (mEq/L)   CO2 27  19 - 32 (mEq/L)   Glucose, Bld 108 (*) 70 - 99 (mg/dL)   BUN 14  6 - 23 (mg/dL)   Creatinine, Ser 9.14  0.50 - 1.10 (mg/dL)   Calcium 9.7  8.4 - 78.2 (mg/dL)   Total Protein 7.6  6.0 - 8.3 (g/dL)   Albumin 3.6  3.5 - 5.2 (g/dL)   AST 16  0 - 37 (U/L)   ALT 14  0 - 35 (U/L)   Alkaline Phosphatase 84  39 - 117 (U/L)   Total Bilirubin 0.5  0.3 - 1.2 (mg/dL)   GFR calc non Af Amer 69 (*) >90 (mL/min)   GFR calc Af Amer 80 (*) >90 (mL/min)  LIPASE, BLOOD      Component Value Range   Lipase 34  11 - 59 (U/L)  URINALYSIS, ROUTINE W REFLEX MICROSCOPIC      Component Value Range   Color, Urine YELLOW  YELLOW     APPearance CLEAR  CLEAR    Specific Gravity, Urine 1.025  1.005 - 1.030    pH 6.5  5.0 - 8.0    Glucose, UA NEGATIVE  NEGATIVE (mg/dL)   Hgb urine dipstick NEGATIVE  NEGATIVE    Bilirubin Urine NEGATIVE  NEGATIVE    Ketones, ur NEGATIVE  NEGATIVE (mg/dL)   Protein, ur 30 (*) NEGATIVE (mg/dL)   Urobilinogen, UA 0.2  0.0 - 1.0 (mg/dL)   Nitrite NEGATIVE  NEGATIVE    Leukocytes, UA NEGATIVE  NEGATIVE   PREGNANCY, URINE      Component Value Range   Preg Test, Ur NEGATIVE  NEGATIVE   URINE MICROSCOPIC-ADD ON      Component Value Range   Squamous Epithelial / LPF MANY (*) RARE    WBC, UA 3-6  <3 (WBC/hpf)   Bacteria, UA RARE  RARE    Dg Abd Acute W/chest 09/16/2011  *RADIOLOGY REPORT*  Clinical Data: Nausea x2 weeks.  Negative urine pregnancy test.  ACUTE ABDOMEN SERIES (ABDOMEN 2 VIEW & CHEST 1 VIEW)  Comparison: 03/04/2010 radiographs.  Findings: The heart size and mediastinal contours are stable.  The lungs are clear.  There is no pleural effusion or pneumothorax.  The bowel gas pattern is normal.  There is no free intraperitoneal air.  Cholecystectomy clips are noted.  There are innumerable pelvic calcifications which appear grossly unchanged.  IMPRESSION: Stable examination.  No acute cardiopulmonary or abdominal process identified.  Original Report Authenticated By: Gerrianne Scale, M.D.   Results for ROSELA, SUPAK (MRN [id number]) as of 09/16/2011 18:26  Ref. Range 10/23/2009 13:00 03/04/2010 12:32 11/18/2010 09:42 09/16/2011 17:28  Hemoglobin Latest Range: 12.0-15.0 g/dL 62.1 (L) 30.8 65.7 84.6 (L)  HCT Latest Range: 36.0-46.0 % 34.4 (L) 36.1 37.2 35.8 (L)     6:26 PM:  UA appears contaminated, UC pending.  Potassium repleted PO.  H/H per pt's baseline.  Feels improved, is sitting in a chair at the bedside talking with family, and wants to go home now.  Has tol PO well while in the ED without N/V.  No stooling while in the ED.  Dx testing d/w pt.  Questions answered.  Verb  understanding, agreeable to d/c home with outpt f/u.         Laray Anger, DO 09/18/11 1722

## 2011-09-16 NOTE — Discharge Instructions (Signed)
RESOURCE GUIDE  Chronic Pain Problems: Contact [facility]  [phone] Patients need to be referred by their primary care doctor.  Insufficient Money for Medicine: Contact United Way:  call "211" or [facility] [phone].  No Primary Care Doctor: - Call [facility]  [phone] - can help you locate a primary care doctor that  accepts your insurance, provides certain services, etc. - Physician Referral Service- 1-800-533-3463  Agencies that provide inexpensive medical care: - [facility]  [phone] - [facility]  [phone] - [facility]  [phone] - [facility]  [phone] - Planned Parenthood  [phone] - [facility]  [phone]  Medicaid-accepting [place] Providers: - [facility]- [address]  [phone], Mon-Fri 9am-7pm, Sat 9am-1pm - [facility]- [address], [address]  [phone] - [facility]- [address], [address]  [phone] - [facility]- [address] [place] [address]  [phone] - Ve[facility]- [address], [phone]  Only accepts [place] Access Medicaid patients after they have their name  applied to their card  Self Pay (no insurance) in [place]: - Sickle Cell Patients: Summer Carpenter, [facility]  [address], [phone] - [facility]- [address]  [phone]       -     [facility] [place]- [address]       -     [facility]- see information above (Speak to Pam H if you do not have insurance)       -  [facility]- [address], [phone]       -  [facility] [place]- [address],  [phone]       -  [facility]- [address] [place] [address], [phone]       -  [facility]-  [address], [place], [phone]       -  [facility]- [address], [phone]       -  [facility]- [address] [place] [address], [phone], also [address], [phone]       -    [facility]- [address], [phone], 1st & 3rd Saturday   every month, 10am-1pm  1) Find a Doctor and Pay Out of Pocket Although you won't have to find out who is covered by your insurance plan, it is a good idea to ask around and get recommendations. You will then need to call the office and see if the doctor you have chosen will accept you as a new patient and what types of options they offer for patients who are self-pay. Some doctors offer discounts or will set up payment plans for their patients who do not have insurance, but you will need to ask so you aren't surprised when you get to your appointment.  2) Contact Your Local Health Department Not all health departments have doctors that can see patients for sick visits, but many do, so it is worth a call to see if yours does. If you don't know where your local health department is, you can check in your phone book. The CDC also has a tool to help you locate your state's health department, and many state websites also have   listings of all of their local health departments.  3) Find a Walk-in Clinic If your illness is not likely to be very severe or complicated, you may want to try a walk in clinic. These are popping up all over the country in pharmacies, drugstores, and shopping centers. They're usually staffed by nurse practitioners or physician assistants that have been trained to treat common illnesses and complaints. They're usually fairly quick and inexpensive. However, if you have serious medical issues or chronic medical problems, these are probably not your best option  STD Testing - [place] [facility] [place], [facility] Clinic, [address], [place], phone [phone] or [phone].  Monday - Friday, call for an appointment. - [place]  [facility] [place], [facility] Clinic, [address], [place], phone [phone] or 1-877-539-9860.  Monday - Friday, call for an appointment.  Abuse/Neglect: - [place] Child Abuse Hotline (336) 641-3795 - [place] Child Abuse Hotline 800-378-5315 (After Hours)  Emergency Shelter:  [facility] (336) 271-5985  Maternity Homes: - [facility] (336) 275-9566 - [facility] (704) 372-4663  MRSA Hotline #:   832-7006  [place] Resources  [facility] [place]  [employer] [place] [facility] [address]                 [address]         [address]  [place]                                               [place]                              [place] Phone:  [phone]                                  Phone:  [phone]                   Phone:  [phone]  [place] [facility], [phone] - [place] Services - [facility]- 1-888-581-9988       -     [facility] in [place], [address],                                  336-349-4454, Insurance  [place] Child Abuse Hotline (336) 342-1394 or (336) 342-3537 (After Hours)   Behavioral Health Services  Substance Abuse Resources: - [facility]  336-882-2125 - [facility] 336-784-9470 - [facility] 336-285-9073 - [facility] 336-845-3988 - Residential & Outpatient Substance Abuse Program  800-659-3381  Psychological Services: -  Health  [phone] - [facility]  [phone] - [place] [facility], [address], [place], ACCESS LINE: 1-800-853-5163 or 336-641-4981, Http://www.[facility].com/services/adult.htm  Dental Assistance  If unable to pay or uninsured, contact:  [facility] or [place] [facility] to become qualified for the adult dental  clinic.  Patients with Medicaid:  [facility] [address], [phone], [phone]  If unable   to pay, or uninsured, contact [facility] 413-426-1077) or [facility] [phone] in [place], [phone] in [facility]) to become qualified for the adult dental clinic  Other Low-Cost Community Dental Services: - [facility]- [address] [place], [place], [place], [address], [phone], [phone], 2nd and 4th Thursday of the month at 6:30am.  10 clients each day by appointment, can sometimes see walk-in patients if someone does not show for an appointment. [facility]- [address] [place], [place], [address], [phone] - [facility]- [address] [place], [place], [address], [phone] [facility]- [phone] [facility] Department- [phone] [facility] [phone]     Eat a bland diet, avoiding greasy, fatty, fried foods, as well as spicy and acidic foods or beverages.  Avoid eating within the hour or 2 before going to bed or laying down.  Also avoid teas, colas, coffee, chocolate, pepermint and spearment.  Take over the counter pepcid, one tablet by mouth twice a day, for the next 2 to 3 weeks.  May also take over the counter maalox/mylanta, as directed on packaging, as needed for discomfort.  Take the new prescription for nausea as directed.  Call your regular medical doctor and your GI doctor tomorrow to schedule a follow up appointment this week.  Return to the Emergency Department immediately if worsening.

## 2011-09-16 NOTE — ED Notes (Signed)
C/o nausea and constipation, took laxative last night and had a BM this morning, denies vomiting, + abd cramps

## 2011-09-18 LAB — URINE CULTURE

## 2011-09-25 ENCOUNTER — Other Ambulatory Visit (HOSPITAL_COMMUNITY): Payer: Self-pay | Admitting: Internal Medicine

## 2011-09-25 DIAGNOSIS — Z139 Encounter for screening, unspecified: Secondary | ICD-10-CM

## 2011-10-22 HISTORY — PX: NM MYOCAR PERF WALL MOTION: HXRAD629

## 2011-10-31 ENCOUNTER — Other Ambulatory Visit (HOSPITAL_COMMUNITY)
Admission: RE | Admit: 2011-10-31 | Discharge: 2011-10-31 | Disposition: A | Discharge status: Home / Self Care | Payer: PRIVATE HEALTH INSURANCE | Source: Ambulatory Visit | Attending: Obstetrics and Gynecology | Admitting: Obstetrics and Gynecology

## 2011-10-31 ENCOUNTER — Other Ambulatory Visit: Payer: Self-pay | Admitting: Obstetrics and Gynecology

## 2011-10-31 DIAGNOSIS — Z01419 Encounter for gynecological examination (general) (routine) without abnormal findings: Secondary | ICD-10-CM | POA: Insufficient documentation

## 2011-11-05 ENCOUNTER — Ambulatory Visit (HOSPITAL_COMMUNITY)
Admission: RE | Admit: 2011-11-05 | Discharge: 2011-11-05 | Disposition: A | Discharge status: Home / Self Care | Payer: PRIVATE HEALTH INSURANCE | Source: Ambulatory Visit | Attending: Internal Medicine | Admitting: Internal Medicine

## 2011-11-05 DIAGNOSIS — Z139 Encounter for screening, unspecified: Secondary | ICD-10-CM

## 2011-11-05 DIAGNOSIS — Z1231 Encounter for screening mammogram for malignant neoplasm of breast: Secondary | ICD-10-CM | POA: Insufficient documentation

## 2012-03-28 ENCOUNTER — Encounter (HOSPITAL_COMMUNITY): Payer: Self-pay

## 2012-03-28 ENCOUNTER — Emergency Department (HOSPITAL_COMMUNITY)
Admission: EM | Admit: 2012-03-28 | Discharge: 2012-03-28 | Disposition: A | Discharge status: Home / Self Care | Payer: PRIVATE HEALTH INSURANCE | Attending: Emergency Medicine | Admitting: Emergency Medicine

## 2012-03-28 DIAGNOSIS — K219 Gastro-esophageal reflux disease without esophagitis: Secondary | ICD-10-CM | POA: Insufficient documentation

## 2012-03-28 DIAGNOSIS — Z7982 Long term (current) use of aspirin: Secondary | ICD-10-CM | POA: Insufficient documentation

## 2012-03-28 DIAGNOSIS — Z79899 Other long term (current) drug therapy: Secondary | ICD-10-CM | POA: Insufficient documentation

## 2012-03-28 DIAGNOSIS — R059 Cough, unspecified: Secondary | ICD-10-CM | POA: Insufficient documentation

## 2012-03-28 DIAGNOSIS — E785 Hyperlipidemia, unspecified: Secondary | ICD-10-CM | POA: Insufficient documentation

## 2012-03-28 DIAGNOSIS — R05 Cough: Secondary | ICD-10-CM | POA: Insufficient documentation

## 2012-03-28 DIAGNOSIS — M549 Dorsalgia, unspecified: Secondary | ICD-10-CM | POA: Insufficient documentation

## 2012-03-28 DIAGNOSIS — F411 Generalized anxiety disorder: Secondary | ICD-10-CM | POA: Insufficient documentation

## 2012-03-28 DIAGNOSIS — R110 Nausea: Secondary | ICD-10-CM | POA: Insufficient documentation

## 2012-03-28 DIAGNOSIS — I10 Essential (primary) hypertension: Secondary | ICD-10-CM | POA: Insufficient documentation

## 2012-03-28 LAB — URINALYSIS, ROUTINE W REFLEX MICROSCOPIC
Bilirubin Urine: NEGATIVE
Ketones, ur: NEGATIVE mg/dL
Nitrite: NEGATIVE
Protein, ur: NEGATIVE mg/dL
Urobilinogen, UA: 0.2 mg/dL (ref 0.0–1.0)

## 2012-03-28 MED ORDER — OXYCODONE-ACETAMINOPHEN 5-325 MG PO TABS: 1.0000 | ORAL_TABLET | ORAL | Status: DC | PRN

## 2012-03-28 NOTE — ED Notes (Signed)
Pt c/o left side pain since Monday. Seen by primary MD but  Not given any pain medication.

## 2012-03-28 NOTE — ED Provider Notes (Signed)
History  This chart was scribed for Summer Gaskins, MD by Ardeen Jourdain, ED Scribe. This patient was seen in room APA07/APA07 and the patient's care was started at 0822.  CSN: [id number]  Arrival date & time 03/28/12  0865   First MD Initiated Contact with Patient 03/28/12 [phone]      Chief Complaint  Patient presents with  . Flank Pain     Patient is a 46 y.o. female presenting with flank pain. The history is provided by the patient. No language interpreter was used.  Flank Pain This is a new problem. The current episode started more than 2 days ago. The problem occurs constantly. The problem has been gradually worsening. Pertinent negatives include no chest pain, no abdominal pain, no headaches and no shortness of breath. Nothing aggravates the symptoms. Nothing relieves the symptoms. She has tried a warm compress and rest for the symptoms.    Summer Carpenter is a 46 y.o. female who presents to the Emergency Department complaining of gradually worsening left sided flank pain that began suddenly began earlier this week. She reports seeing her PCP for the pain but was given no pain medication. She states she has tried muscle cream and ibuprofen for the pain with no relief. She denies possible injury to the area but it does hurt with movement   Past Medical History  Diagnosis Date  . Hypertension   . Anxiety   . Hyperlipidemia   . GERD (gastroesophageal reflux disease)     Past Surgical History  Procedure Date  . Cholecystectomy     No family history on file.  History  Substance Use Topics  . Smoking status: Never Smoker   . Smokeless tobacco: Not on file  . Alcohol Use: No   No OB history available.  Review of Systems  Constitutional: Negative for fever, activity change and fatigue.  Respiratory: Positive for cough. Negative for shortness of breath.   Cardiovascular: Negative for chest pain.  Gastrointestinal: Positive for nausea. Negative for vomiting,  abdominal pain and diarrhea.  Genitourinary: Positive for flank pain. Negative for dysuria, urgency, difficulty urinating and pelvic pain.  Neurological: Negative for weakness, numbness and headaches.  All other systems reviewed and are negative.    Allergies  Septra  Home Medications   Current Outpatient Rx  Name  Route  Sig  Dispense  Refill  . ASPIRIN EC 81 MG PO TBEC   Oral   Take 81 mg by mouth daily.           Marland Kitchen HYDROCHLOROTHIAZIDE 25 MG PO TABS   Oral   Take 25 mg by mouth daily.           . IBUPROFEN 200 MG PO TABS   Oral   Take 200 mg by mouth as needed. For pain          . OMEPRAZOLE 20 MG PO CPDR   Oral   Take 20 mg by mouth daily.         Marland Kitchen PROPRANOLOL HCL 40 MG PO TABS   Oral   Take 40 mg by mouth at bedtime.           . SERTRALINE HCL 100 MG PO TABS   Oral   Take 100 mg by mouth at bedtime.          Marland Kitchen SIMVASTATIN 10 MG PO TABS   Oral   Take 10 mg by mouth at bedtime.          Marland Kitchen  SUCRALFATE 1 G PO TABS   Oral   Take 1 g by mouth 4 (four) times daily.           Triage Vitals: BP 126/75  Pulse 76  Temp 97.4 F (36.3 C)  Resp 20  Ht 5\' 7"  (1.702 m)  Wt 235 lb (106.595 kg)  BMI 36.81 kg/m2  SpO2 98%  LMP 03/28/2012  Physical Exam  CONSTITUTIONAL: Well developed/well nourished HEAD AND FACE: Normocephalic/atraumatic EYES: EOMI/PERRL ENMT: Mucous membranes moist NECK: supple no meningeal signs SPINE:entire spine nontender , No bruising/crepitance/stepoffs noted to spine Mild Tenderness to just below left costal margin.  No bruising or crepitance Worse with movement of her torso CV: S1/S2 noted, no murmurs/rubs/gallops noted LUNGS: Lungs are clear to auscultation bilaterally, no apparent distress ABDOMEN: soft, nontender, no rebound or guarding GU:no cva tenderness NEURO: Awake/alert,  Pt is able to ambulate. No focal motor deficits in her LE EXTREMITIES: full ROM SKIN: warm, color normal PSYCH: no abnormalities of mood  noted    ED Course  Procedures   DIAGNOSTIC STUDIES: Oxygen Saturation is 98% on room air, normal by my interpretation.    COORDINATION OF CARE:  8:37 AM: Discussed treatment plan which includes a urinalysis with pt at bedside and pt agreed to plan.  Pt well appearing, no distress, no cp/sob reported.  No LE weakness.  Pain is worse with movement of torso.  Doubt acute abdominal/neurologic/vascular emergency She is requesting pain meds at discharge, but none here in the ED is requested Stable for d/c.  Likely musculoskeletal in nature    MDM  Nursing notes including past medical history and social history reviewed and considered in documentation Labs/vital reviewed and considered       I personally performed the services described in this documentation, which was scribed in my presence. The recorded information has been reviewed and is accurate.      Summer Gaskins, MD 03/28/12 [phone]

## 2012-03-28 NOTE — ED Notes (Signed)
Patient with no complaints at this time. Respirations even and unlabored. Skin warm/dry. Discharge instructions reviewed with patient at this time. Patient given opportunity to voice concerns/ask questions. Patient discharged at this time and left Emergency Department with steady gait.   

## 2012-05-11 ENCOUNTER — Emergency Department (HOSPITAL_COMMUNITY)
Admission: EM | Admit: 2012-05-11 | Discharge: 2012-05-11 | Disposition: A | Discharge status: Home / Self Care | Payer: Self-pay | Attending: Emergency Medicine | Admitting: Emergency Medicine

## 2012-05-11 ENCOUNTER — Encounter (HOSPITAL_COMMUNITY): Payer: Self-pay | Admitting: Emergency Medicine

## 2012-05-11 DIAGNOSIS — I69998 Other sequelae following unspecified cerebrovascular disease: Secondary | ICD-10-CM | POA: Insufficient documentation

## 2012-05-11 DIAGNOSIS — R05 Cough: Secondary | ICD-10-CM | POA: Insufficient documentation

## 2012-05-11 DIAGNOSIS — R51 Headache: Secondary | ICD-10-CM | POA: Insufficient documentation

## 2012-05-11 DIAGNOSIS — M549 Dorsalgia, unspecified: Secondary | ICD-10-CM | POA: Insufficient documentation

## 2012-05-11 DIAGNOSIS — R0981 Nasal congestion: Secondary | ICD-10-CM

## 2012-05-11 DIAGNOSIS — E785 Hyperlipidemia, unspecified: Secondary | ICD-10-CM | POA: Insufficient documentation

## 2012-05-11 DIAGNOSIS — R42 Dizziness and giddiness: Secondary | ICD-10-CM

## 2012-05-11 DIAGNOSIS — F411 Generalized anxiety disorder: Secondary | ICD-10-CM | POA: Insufficient documentation

## 2012-05-11 DIAGNOSIS — K219 Gastro-esophageal reflux disease without esophagitis: Secondary | ICD-10-CM | POA: Insufficient documentation

## 2012-05-11 DIAGNOSIS — R059 Cough, unspecified: Secondary | ICD-10-CM | POA: Insufficient documentation

## 2012-05-11 DIAGNOSIS — I10 Essential (primary) hypertension: Secondary | ICD-10-CM | POA: Insufficient documentation

## 2012-05-11 MED ORDER — MECLIZINE HCL 32 MG PO TABS: 32.0000 mg | ORAL_TABLET | Freq: Three times a day (TID) | ORAL | Status: DC | PRN

## 2012-05-11 NOTE — ED Provider Notes (Signed)
History     CSN: [id number]  Arrival date & time 05/11/12  1130   First MD Initiated Contact with Patient 05/11/12 1202      Chief Complaint  Patient presents with  . Dizziness    HPI Summer Carpenter is a 47 y.o. female who presents to the ED with dizziness. The dizziness started 3 days ago. She states she feels off balance. Denies visual problems, chest pain or palpations. Has pain in the mid forehead that is dull, has a lot of post nasal drainage and coughing up yellow sputum. Has had cough and congestion for the past month and was treated by PCP with ? Amoxicillin for 10 days but has continued to have symptoms. Patient states she thinks she needs to go to ENT because had similar problems 2 or 3 years ago and put on antibiotics for 2 weeks and symptoms went completely away. Patient moved into an older home that has old carpet and thinks part of her problem may be mold in the carpet. Plans to remove the carpet. The history was provided by the patient.   Past Medical History  Diagnosis Date  . Hypertension   . Anxiety   . Hyperlipidemia   . GERD (gastroesophageal reflux disease)     Past Surgical History  Procedure Date  . Cholecystectomy     No family history on file.  History  Substance Use Topics  . Smoking status: Never Smoker   . Smokeless tobacco: Not on file  . Alcohol Use: No    OB History    Grav Para Term Preterm Abortions TAB SAB Ect Mult Living                  Review of Systems  Constitutional: Negative for fever, chills, diaphoresis and fatigue.  HENT: Positive for congestion and sneezing. Negative for ear pain, sore throat, facial swelling, neck pain, neck stiffness, dental problem and sinus pressure.   Eyes: Negative for photophobia, pain and discharge.  Respiratory: Positive for cough. Negative for chest tightness, shortness of breath and wheezing.   Cardiovascular: Negative for chest pain and palpitations.  Gastrointestinal: Negative for nausea,  vomiting, abdominal pain, diarrhea, constipation and abdominal distention.  Genitourinary: Positive for frequency. Negative for dysuria, urgency, flank pain and difficulty urinating.  Musculoskeletal: Positive for back pain (chronic). Negative for myalgias and gait problem.  Skin: Negative for color change and rash.  Neurological: Positive for dizziness and headaches. Negative for syncope, speech difficulty, weakness, light-headedness and numbness.  Psychiatric/Behavioral: Negative for confusion and agitation. The patient is nervous/anxious (hx of anxiety and depression).     Allergies  Septra  Home Medications   Current Outpatient Rx  Name  Route  Sig  Dispense  Refill  . ASPIRIN EC 81 MG PO TBEC   Oral   Take 81 mg by mouth daily.           Marland Kitchen HYDROCHLOROTHIAZIDE 25 MG PO TABS   Oral   Take 12.5 mg by mouth daily.          . IBUPROFEN 200 MG PO TABS   Oral   Take 200 mg by mouth as needed. For pain          . OMEPRAZOLE 20 MG PO CPDR   Oral   Take 20 mg by mouth daily.         Marland Kitchen PROPRANOLOL HCL 40 MG PO TABS   Oral   Take 40 mg by mouth at bedtime.           Marland Kitchen  SERTRALINE HCL 100 MG PO TABS   Oral   Take 50 mg by mouth at bedtime.          Marland Kitchen SIMVASTATIN 10 MG PO TABS   Oral   Take 10 mg by mouth at bedtime.          . MECLIZINE HCL 32 MG PO TABS   Oral   Take 1 tablet (32 mg total) by mouth 3 (three) times daily as needed.   30 tablet   0     BP 142/82  Pulse 90  Temp 98.1 F (36.7 C) (Oral)  Resp 20  Ht 5' 7.5" (1.715 m)  Wt 240 lb (108.863 kg)  BMI 37.03 kg/m2  SpO2 100%  LMP 04/24/2012  Physical Exam  Nursing note and vitals reviewed. Constitutional: She is oriented to person, place, and time. She appears well-developed and well-nourished.  HENT:  Head: Normocephalic and atraumatic.  Right Ear: Tympanic membrane, external ear and ear canal normal.  Left Ear: Tympanic membrane, external ear and ear canal normal.  Nose: Right sinus  exhibits no maxillary sinus tenderness. Left sinus exhibits no maxillary sinus tenderness.  Mouth/Throat: Uvula is midline, oropharynx is clear and moist and mucous membranes are normal. No uvula swelling.  Eyes: Conjunctivae normal and EOM are normal. Pupils are equal, round, and reactive to light.  Neck: Normal range of motion. Neck supple.  Cardiovascular: Normal rate and regular rhythm.   Pulmonary/Chest: Effort normal and breath sounds normal. No respiratory distress.  Abdominal: Soft. There is no tenderness.  Musculoskeletal: Normal range of motion. She exhibits no edema.  Neurological: She is alert and oriented to person, place, and time. She has normal strength. No cranial nerve deficit or sensory deficit. She displays a negative Romberg sign.       Stands on one foot without difficulty. Rapid alternating movements without difficulty. Steady gait.   Skin: Skin is warm and dry.  Psychiatric: She has a normal mood and affect. Her behavior is normal. Judgment and thought content normal.   Procedures I have discussed this case with Dr. Lynelle Doctor  Assessment: 47 y.o. female with vertigo   Sinus congestion  Plan:  Keep appointment this week with PCP   Possible ENT referal   Decongestants   Meclizine  Discussed with the patient and all questioned fully answered. She will return if any problems arise.    Medication List     As of 05/11/2012 12:47 PM    START taking these medications         meclizine 32 MG tablet   Commonly known as: ANTIVERT   Take 1 tablet (32 mg total) by mouth 3 (three) times daily as needed.      ASK your doctor about these medications         aspirin EC 81 MG tablet      hydrochlorothiazide 25 MG tablet   Commonly known as: HYDRODIURIL      ibuprofen 200 MG tablet   Commonly known as: ADVIL,MOTRIN      omeprazole 20 MG capsule   Commonly known as: PRILOSEC      propranolol 40 MG tablet   Commonly known as: INDERAL      sertraline 100 MG tablet    Commonly known as: ZOLOFT      simvastatin 10 MG tablet   Commonly known as: ZOCOR          Where to get your medications    These are the prescriptions that  you need to pick up.   You may get these medications from any pharmacy.         meclizine 32 MG tablet               [facility], [place] 05/11/12 1247

## 2012-05-11 NOTE — ED Notes (Signed)
Pt c/o dizziness/off balance feeling x 3 days. nad noted.

## 2012-05-11 NOTE — ED Provider Notes (Signed)
Medical screening examination/treatment/procedure(s) were performed by non-physician practitioner and as supervising physician I was immediately available for consultation/collaboration.   Lyanne Co, MD 05/11/12 [phone]

## 2012-09-11 ENCOUNTER — Ambulatory Visit (INDEPENDENT_AMBULATORY_CARE_PROVIDER_SITE_OTHER): Payer: PRIVATE HEALTH INSURANCE | Admitting: Cardiovascular Disease

## 2012-09-11 ENCOUNTER — Encounter: Payer: Self-pay | Admitting: *Deleted

## 2012-09-11 VITALS — BP 120/78 | HR 75 | Resp 20 | Ht 67.0 in | Wt 235.3 lb

## 2012-09-11 DIAGNOSIS — R002 Palpitations: Secondary | ICD-10-CM

## 2012-09-11 DIAGNOSIS — E785 Hyperlipidemia, unspecified: Secondary | ICD-10-CM

## 2012-09-11 DIAGNOSIS — I10 Essential (primary) hypertension: Secondary | ICD-10-CM

## 2012-09-11 DIAGNOSIS — E669 Obesity, unspecified: Secondary | ICD-10-CM

## 2012-09-11 MED ORDER — PROPRANOLOL HCL ER 60 MG PO CP24: 60.0000 mg | ORAL_CAPSULE | Freq: Every day | ORAL | Status: DC

## 2012-09-11 NOTE — Progress Notes (Signed)
Patient ID: Chong Sicilian, female   DOB: Aug 26, 1965, 47 y.o.   MRN: [id number]   This is Mrs. Gilberg's first visit to our offices 2011 when she saw Dr. Garen Lah     Reason for office visit Palpitations  The patient is a 47 year old woman with multiple risk factors for structural heart disease but with a negative cardiac workup in 2003. She complains of multiple episodes of palpitations that occur several times a day. Each individual episode last for 2-3 minutes and she has an average of 10 episodes daily. She does not feel particularly sick when the palpitations occur but she worries that they are sinus something wrong with her heart. She denies significant shortness of breath chest pain dizziness lightheadedness syncope focal neurological deficits or lower showed edema. She has a relatively sedentary lifestyle, but does not feel that the palpitations restrict her activities of daily living.  In 2003 she underwent cardiac catheterization for what was proven to be a "false negatives" myocardial perfusion study. Left ventricular systolic function was normal. The nuclear stress tests and reported to be only 40% but by both echo and left ventriculography the left ventricle function and regional wall motion was normal. No significant valvular abnormalities and no left ventricular hypertrophy was seen on echocardiography.  She has hypertension hyperlipidemia both of which are reasonably well controlled. She does not have diabetes mellitus and has never smoked.    Allergies  Allergen Reactions  . Septra (Bactrim) Hives and Diarrhea    Current Outpatient Prescriptions  Medication Sig Dispense Refill  . aspirin EC 81 MG tablet Take 81 mg by mouth daily.        . hydrochlorothiazide 25 MG tablet Take 12.5 mg by mouth daily.       Marland Kitchen omeprazole (PRILOSEC) 20 MG capsule Take 20 mg by mouth daily.      . sertraline (ZOLOFT) 100 MG tablet Take 50 mg by mouth at bedtime.       . simvastatin  (ZOCOR) 10 MG tablet Take 10 mg by mouth at bedtime.       Marland Kitchen ibuprofen (ADVIL,MOTRIN) 200 MG tablet Take 200 mg by mouth as needed. For pain       . meclizine (ANTIVERT) 32 MG tablet Take 1 tablet (32 mg total) by mouth 3 (three) times daily as needed.  30 tablet  0  . propranolol ER (INDERAL LA) 60 MG 24 hr capsule Take 1 capsule (60 mg total) by mouth daily.  90 capsule  3   No current facility-administered medications for this visit.    Past Medical History  Diagnosis Date  . Hypertension   . Anxiety   . Hyperlipidemia   . GERD (gastroesophageal reflux disease)     Past Surgical History  Procedure Laterality Date  . Cholecystectomy      No family history on file.  History   Social History  . Marital Status: Single    Spouse Name: N/A    Number of Children: N/A  . Years of Education: N/A   Occupational History  . Not on file.   Social History Main Topics  . Smoking status: Never Smoker   . Smokeless tobacco: Not on file  . Alcohol Use: No  . Drug Use: No  . Sexually Active:    Other Topics Concern  . Not on file   Social History Narrative  . No narrative on file    Review of systems: The patient specifically denies any chest pain at rest or  with exertion, dyspnea at rest or with exertion, orthopnea, paroxysmal nocturnal dyspnea, syncope, focal neurological deficits, intermittent claudication, lower extremity edema, unexplained weight gain, cough, hemoptysis or wheezing.  The patient also denies abdominal pain, nausea, vomiting, dysphagia, diarrhea, constipation, polyuria, polydipsia, dysuria, hematuria, frequency, urgency, abnormal bleeding or bruising, fever, chills, unexpected weight changes, mood swings, change in skin or hair texture, change in voice quality, auditory or visual problems, allergic reactions or rashes, new musculoskeletal complaints other than usual "aches and pains".   PHYSICAL EXAM BP 120/78  Pulse 75  Resp 20  Ht 5\' 7"  (1.702 m)  Wt  106.731 kg (235 lb 4.8 oz)  BMI 36.84 kg/m2  General: Alert, oriented x3, no distress; she is moderately obese Head: no evidence of trauma, PERRL, EOMI, no exophtalmos or lid lag, no myxedema, no xanthelasma; normal ears, nose and oropharynx Neck: normal jugular venous pulsations and no hepatojugular reflux; brisk carotid pulses without delay and no carotid bruits Chest: clear to auscultation, no signs of consolidation by percussion or palpation, normal fremitus, symmetrical and full respiratory excursions Cardiovascular: normal position and quality of the apical impulse, regular rhythm, normal first and second heart sounds, no murmurs, rubs or gallops Abdomen: no tenderness or distention, no masses by palpation, no abnormal pulsatility or arterial bruits, normal bowel sounds, no hepatosplenomegaly Extremities: no clubbing, cyanosis or edema; 2+ radial, ulnar and brachial pulses bilaterally; 2+ right femoral, posterior tibial and dorsalis pedis pulses; 2+ left femoral, posterior tibial and dorsalis pedis pulses; no subclavian or femoral bruits Neurological: grossly nonfocal   EKG: Normal sinus rhythm normal tracing  Lipid Panel  No results found for this basename: chol, trig, hdl, cholhdl, vldl, ldlcalc   most recent lipid panel from 2011 shows total cholesterol 172 LDL 102 HDL 56 triglyceride 70  BMET    Component Value Date/Time   NA 138 09/16/2011 1728   K 3.3* 09/16/2011 1728   CL 100 09/16/2011 1728   CO2 27 09/16/2011 1728   GLUCOSE 108* 09/16/2011 1728   BUN 14 09/16/2011 1728   CREATININE 0.98 09/16/2011 1728   CALCIUM 9.7 09/16/2011 1728   GFRNONAA 69* 09/16/2011 1728   GFRAA 80* 09/16/2011 1728     ASSESSMENT AND PLAN Palpitations As far as I can tell from the patient's description she has either premature ventricular contractions or premature atrial contractions. There does not appear to be a background of significant structural heart disease, therefore they are likely to be benign. We'll  check a Holter monitor to make sure she does not have more complex arrhythmia. I think that the beta blocker that she is taking is likely helping but it is an immediate release short acting agent and I have recommended that we switch to long-acting Inderal 60 mg once daily. If her blood pressure drops too low,  would have to discontinue her hydrochlorothiazide.  We will check as he just had any more recent laboratory tests. A low potassium level may be responsible for her palpitations.  Hyperlipidemia Would try to get more recent laboratory tests from her primary care practice  Essential hypertension Good control. Consider adding potassium supplements or stopping her hydrochlorothiazide. Hypokalemia may be precipitating her increased frequency of palpitations.   Orders Placed This Encounter  Procedures  . EKG 12-Lead  . Holter monitor - 24 hour   Meds ordered this encounter  Medications  . propranolol ER (INDERAL LA) 60 MG 24 hr capsule    Sig: Take 1 capsule (60 mg total) by mouth daily.  Dispense:  90 capsule    Refill:  3    Pedrohenrique Mccon[place]  Thurmon Fair, MD, [facility] [phone] office [phone] pager

## 2012-09-11 NOTE — Patient Instructions (Addendum)
Your physician recommends that you schedule a follow-up appointment in: 4 weeks. Your physician has recommended that you wear a holter monitor. Holter monitors are medical devices that record the heart's electrical activity. Doctors most often use these monitors to diagnose arrhythmias. Arrhythmias are problems with the speed or rhythm of the heartbeat. The monitor is a small, portable device. You can wear one while you do your normal daily activities. This is usually used to diagnose what is causing palpitations/syncope (passing out). Your physician has recommended you make the following change in your medication: stop propranolol 40 mg daily. Start Propranolol LA 60 mg daily.

## 2012-09-14 ENCOUNTER — Encounter: Payer: Self-pay | Admitting: Cardiovascular Disease

## 2012-09-14 DIAGNOSIS — R002 Palpitations: Secondary | ICD-10-CM | POA: Insufficient documentation

## 2012-09-14 DIAGNOSIS — I10 Essential (primary) hypertension: Secondary | ICD-10-CM | POA: Insufficient documentation

## 2012-09-14 DIAGNOSIS — E785 Hyperlipidemia, unspecified: Secondary | ICD-10-CM | POA: Insufficient documentation

## 2012-09-14 DIAGNOSIS — E669 Obesity, unspecified: Secondary | ICD-10-CM | POA: Insufficient documentation

## 2012-09-14 NOTE — Assessment & Plan Note (Addendum)
As far as I can tell from the patient's description she has either premature ventricular contractions or premature atrial contractions. There does not appear to be a background of significant structural heart disease, therefore they are likely to be benign. We'll check a Holter monitor to make sure she does not have more complex arrhythmia. I think that the beta blocker that she is taking is likely helping but it is an immediate release short acting agent and I have recommended that we switch to long-acting Inderal 60 mg once daily. If her blood pressure drops too low,  would have to discontinue her hydrochlorothiazide.  We will check as he just had any more recent laboratory tests. A low potassium level may be responsible for her palpitations.

## 2012-09-14 NOTE — Assessment & Plan Note (Addendum)
Good control. Consider adding potassium supplements or stopping her hydrochlorothiazide. Hypokalemia may be precipitating her increased frequency of palpitations.

## 2012-09-14 NOTE — Assessment & Plan Note (Signed)
Would try to get more recent laboratory tests from her primary care practice

## 2012-09-26 ENCOUNTER — Telehealth: Payer: Self-pay | Admitting: *Deleted

## 2012-09-26 NOTE — Telephone Encounter (Signed)
Normal results call to pt. About Holter Monitor

## 2012-10-04 ENCOUNTER — Encounter: Payer: Self-pay | Admitting: *Deleted

## 2012-10-06 ENCOUNTER — Encounter: Payer: Self-pay | Admitting: Cardiovascular Disease

## 2012-10-06 ENCOUNTER — Telehealth: Payer: Self-pay | Admitting: Cardiovascular Disease

## 2012-10-06 NOTE — Telephone Encounter (Signed)
Returning your call. °

## 2012-10-08 ENCOUNTER — Other Ambulatory Visit (HOSPITAL_COMMUNITY): Payer: Self-pay | Admitting: Family Medicine

## 2012-10-08 ENCOUNTER — Encounter: Payer: Self-pay | Admitting: Cardiology

## 2012-10-08 ENCOUNTER — Ambulatory Visit (INDEPENDENT_AMBULATORY_CARE_PROVIDER_SITE_OTHER): Payer: PRIVATE HEALTH INSURANCE | Admitting: Cardiology

## 2012-10-08 VITALS — BP 138/78 | HR 68 | Ht 67.0 in | Wt 234.4 lb

## 2012-10-08 DIAGNOSIS — R404 Transient alteration of awareness: Secondary | ICD-10-CM

## 2012-10-08 DIAGNOSIS — I10 Essential (primary) hypertension: Secondary | ICD-10-CM

## 2012-10-08 DIAGNOSIS — R5383 Other fatigue: Secondary | ICD-10-CM

## 2012-10-08 DIAGNOSIS — E669 Obesity, unspecified: Secondary | ICD-10-CM

## 2012-10-08 DIAGNOSIS — R0683 Snoring: Secondary | ICD-10-CM | POA: Insufficient documentation

## 2012-10-08 DIAGNOSIS — R400 Somnolence: Secondary | ICD-10-CM

## 2012-10-08 DIAGNOSIS — E785 Hyperlipidemia, unspecified: Secondary | ICD-10-CM

## 2012-10-08 DIAGNOSIS — R5381 Other malaise: Secondary | ICD-10-CM

## 2012-10-08 DIAGNOSIS — Z139 Encounter for screening, unspecified: Secondary | ICD-10-CM

## 2012-10-08 DIAGNOSIS — R0609 Other forms of dyspnea: Secondary | ICD-10-CM

## 2012-10-08 DIAGNOSIS — R002 Palpitations: Secondary | ICD-10-CM

## 2012-10-08 NOTE — Progress Notes (Signed)
10/08/2012 Summer Carpenter   [birth year]  [id number]  Primary Physicia CLAGGETT,ELIN, PA-C Primary Cardiologist: Dr Royann Shivers  HPI:  47 y/o obese, African American female followed by Dr Royann Shivers with a history of palpitations. In 2003 she underwent cardiac catheterization for what was proven to be a "false negatives" myocardial perfusion study. Left ventricular systolic function was normal. The nuclear stress tests and reported to be only 40% but by both echo and left ventriculography the left ventricle function and regional wall motion was normal. No significant valvular abnormalities and no left ventricular hypertrophy was seen on echocardiography. She has hypertension hyperlipidemia both of which are reasonably well controlled. She does not have diabetes mellitus and has never smoked.       She was referred to [place] for an "abnormal EKG" and palpitations. Dr Royann Shivers adjusted her medications earlier this month and she is her for follow up. She says she has noticed a decrease in her palpitations on Inderal. On further interview she admitted to a history of poor sleep quality, snoring, and daytime fatigue. Her brother has sleep apnea. I suggested we look into these symptoms further with a sleep study as sleep apnea can exacerbate her palpitations and may eventually cause arrhythmias and other cardiac pathology.    Current Outpatient Prescriptions  Medication Sig Dispense Refill  . aspirin EC 81 MG tablet Take 81 mg by mouth daily.        . hydrochlorothiazide (MICROZIDE) 12.5 MG capsule Take 12.5 mg by mouth daily.      Marland Kitchen ibuprofen (ADVIL,MOTRIN) 200 MG tablet Take 200 mg by mouth as needed. For pain       . omeprazole (PRILOSEC) 20 MG capsule Take 20 mg by mouth daily.      . propranolol ER (INDERAL LA) 60 MG 24 hr capsule Take 1 capsule (60 mg total) by mouth daily.  90 capsule  3  . sertraline (ZOLOFT) 100 MG tablet Take 50 mg by mouth at bedtime.       . simvastatin (ZOCOR) 10 MG tablet Take  10 mg by mouth at bedtime.        No current facility-administered medications for this visit.    Allergies  Allergen Reactions  . Septra (Bactrim) Hives and Diarrhea    History   Social History  . Marital Status: Single    Spouse Name: N/A    Number of Children: N/A  . Years of Education: N/A   Occupational History  . Not on file.   Social History Main Topics  . Smoking status: Never Smoker   . Smokeless tobacco: Never Used  . Alcohol Use: Yes     Comment: Occasionally, once a month  . Drug Use: No  . Sexually Active: Not on file   Other Topics Concern  . Not on file   Social History Narrative  . No narrative on file     Review of Systems: General: negative for chills, fever, night sweats or weight changes. Daytime fatigue, snoring, poor sleep quality Cardiovascular: negative for chest pain, dyspnea on exertion, edema, orthopnea, paroxysmal nocturnal dyspnea or shortness of breath Dermatological: negative for rash Respiratory: negative for cough or wheezing Urologic: negative for hematuria Abdominal: negative for nausea, vomiting, diarrhea, bright red blood per rectum, melena, or hematemesis Neurologic: negative for visual changes, syncope, or dizziness All other systems reviewed and are otherwise negative except as noted above.    Blood pressure 138/78, pulse 68, height 5\' 7"  (1.702 m), weight 234 lb 6.4 oz (106.323  kg).  General appearance: alert, cooperative, no distress and moderately obese Lungs: clear to auscultation bilaterally Heart: regular rate and rhythm Extrem: no edema.  ASSESSMENT AND PLAN:   Palpitations Better with Inderal  Essential hypertension Controlled  Obesity (BMI 35.0-39.9 without comorbidity) .  Fatigue .  Snoring .  Hyperlipidemia .    PLAN  Continue Inderal. Sleep study. F/U Dr Royann Shivers. We discussed avoiding stimulants and over the counter decongestants.   [facility] KPA-C 10/08/2012 2:34 PM

## 2012-10-08 NOTE — Assessment & Plan Note (Signed)
Better with Inderal

## 2012-10-08 NOTE — Patient Instructions (Addendum)
Avoid stimulants. Sleep study, follow up with Dr Royann Shivers 3 months.  Your physician has recommended that you have a sleep study. This test records several body functions during sleep, including: brain activity, eye movement, oxygen and carbon dioxide blood levels, heart rate and rhythm, breathing rate and rhythm, the flow of air through your mouth and nose, snoring, body muscle movements, and chest and belly movement.

## 2012-10-08 NOTE — Assessment & Plan Note (Signed)
Controlled.  

## 2012-10-09 ENCOUNTER — Ambulatory Visit: Payer: PRIVATE HEALTH INSURANCE | Admitting: Cardiology

## 2012-10-30 DIAGNOSIS — G4761 Periodic limb movement disorder: Secondary | ICD-10-CM

## 2012-10-30 DIAGNOSIS — G4733 Obstructive sleep apnea (adult) (pediatric): Secondary | ICD-10-CM

## 2012-11-06 ENCOUNTER — Ambulatory Visit (HOSPITAL_COMMUNITY)
Admission: RE | Admit: 2012-11-06 | Discharge: 2012-11-06 | Disposition: A | Discharge status: Home / Self Care | Payer: PRIVATE HEALTH INSURANCE | Source: Ambulatory Visit | Attending: Family Medicine | Admitting: Family Medicine

## 2012-11-06 DIAGNOSIS — Z1231 Encounter for screening mammogram for malignant neoplasm of breast: Secondary | ICD-10-CM | POA: Insufficient documentation

## 2012-11-06 DIAGNOSIS — Z139 Encounter for screening, unspecified: Secondary | ICD-10-CM

## 2012-11-18 ENCOUNTER — Telehealth: Payer: Self-pay | Admitting: Cardiovascular Disease

## 2012-11-18 NOTE — Telephone Encounter (Signed)
Returned call.  Left message that results not reviewed yet and with process of sleep study.  Also advised to call back before 4pm if questions.

## 2012-11-18 NOTE — Telephone Encounter (Signed)
Summer Carpenter is calling about her results of her sleep study. Please call   Thanks

## 2012-12-05 DIAGNOSIS — G4761 Periodic limb movement disorder: Secondary | ICD-10-CM

## 2012-12-05 DIAGNOSIS — G4733 Obstructive sleep apnea (adult) (pediatric): Secondary | ICD-10-CM

## 2012-12-11 ENCOUNTER — Telehealth: Payer: Self-pay | Admitting: Cardiovascular Disease

## 2012-12-11 NOTE — Telephone Encounter (Signed)
Please call-started back having palpitations this week-Please call to advise.

## 2012-12-11 NOTE — Telephone Encounter (Signed)
Agree with advice

## 2012-12-11 NOTE — Telephone Encounter (Signed)
Per B. Hager, PA-C, he will call pt back.

## 2012-12-11 NOTE — Telephone Encounter (Signed)
Message forwarded to B. Hager, PA-C for further instructions.  

## 2012-12-11 NOTE — Telephone Encounter (Signed)
Returned call.  Pt stated she has been having a fluttering sensation all week.  Wants to know if her medication needs to be adjusted or if she needs to be seen sooner than the 15th.  Pt denied CP, SOB or other symptoms except intermittent dizziness w/ changing positions quickly.  Pt informed Dr. Salena Saner will be notified for further instructions.  Pt verbalized understanding and agreed w/ plan.  Message forwarded to Dr. Royann Shivers.

## 2012-12-11 NOTE — Telephone Encounter (Signed)
I tried calling the patient and there was no answer.  No cell listed.  Has the patient checked her BP?  Is her dizziness caused by the fluttering, propranolol or low BP?  Hard to tell over the phone.  If her BP is WNL, we can increase propranolol ER to 80mg  daily and see if that helps the fluttering.    Neeya Prigmore 2:19 PM

## 2012-12-11 NOTE — Telephone Encounter (Signed)
Returning a call

## 2012-12-11 NOTE — Telephone Encounter (Signed)
I spoke to the patient and I asked if she was getting any caffeine throughout the day.  She said yes. I recommend that she first, eliminate that and see if the fluttering frequency subsides.  Dania Marsan 2:38 PM.

## 2012-12-14 ENCOUNTER — Encounter (HOSPITAL_COMMUNITY): Payer: Self-pay

## 2012-12-14 ENCOUNTER — Emergency Department (HOSPITAL_COMMUNITY)
Admission: EM | Admit: 2012-12-14 | Discharge: 2012-12-14 | Disposition: A | Discharge status: Home / Self Care | Payer: PRIVATE HEALTH INSURANCE | Attending: Emergency Medicine | Admitting: Emergency Medicine

## 2012-12-14 ENCOUNTER — Emergency Department (HOSPITAL_COMMUNITY): Payer: PRIVATE HEALTH INSURANCE

## 2012-12-14 DIAGNOSIS — Z9889 Other specified postprocedural states: Secondary | ICD-10-CM | POA: Insufficient documentation

## 2012-12-14 DIAGNOSIS — Z862 Personal history of diseases of the blood and blood-forming organs and certain disorders involving the immune mechanism: Secondary | ICD-10-CM | POA: Insufficient documentation

## 2012-12-14 DIAGNOSIS — E669 Obesity, unspecified: Secondary | ICD-10-CM | POA: Insufficient documentation

## 2012-12-14 DIAGNOSIS — K219 Gastro-esophageal reflux disease without esophagitis: Secondary | ICD-10-CM | POA: Insufficient documentation

## 2012-12-14 DIAGNOSIS — R110 Nausea: Secondary | ICD-10-CM | POA: Insufficient documentation

## 2012-12-14 DIAGNOSIS — Z7982 Long term (current) use of aspirin: Secondary | ICD-10-CM | POA: Insufficient documentation

## 2012-12-14 DIAGNOSIS — I10 Essential (primary) hypertension: Secondary | ICD-10-CM | POA: Insufficient documentation

## 2012-12-14 DIAGNOSIS — F411 Generalized anxiety disorder: Secondary | ICD-10-CM | POA: Insufficient documentation

## 2012-12-14 DIAGNOSIS — E785 Hyperlipidemia, unspecified: Secondary | ICD-10-CM | POA: Insufficient documentation

## 2012-12-14 DIAGNOSIS — R002 Palpitations: Secondary | ICD-10-CM | POA: Insufficient documentation

## 2012-12-14 DIAGNOSIS — Z8639 Personal history of other endocrine, nutritional and metabolic disease: Secondary | ICD-10-CM | POA: Insufficient documentation

## 2012-12-14 DIAGNOSIS — Z79899 Other long term (current) drug therapy: Secondary | ICD-10-CM | POA: Insufficient documentation

## 2012-12-14 DIAGNOSIS — Z8669 Personal history of other diseases of the nervous system and sense organs: Secondary | ICD-10-CM | POA: Insufficient documentation

## 2012-12-14 HISTORY — DX: Sleep apnea, unspecified: G47.30

## 2012-12-14 LAB — BASIC METABOLIC PANEL
BUN: 11 mg/dL (ref 6–23)
GFR calc Af Amer: 74 mL/min — ABNORMAL LOW (ref >90)
GFR calc non Af Amer: 64 mL/min — ABNORMAL LOW (ref >90)
Potassium: 3.5 mEq/L (ref 3.5–5.1)

## 2012-12-14 LAB — TROPONIN I: Troponin I: <0.3 ng/mL (ref <0.30)

## 2012-12-14 LAB — CBC WITH DIFFERENTIAL/PLATELET
Basophils Absolute: 0 10*3/uL (ref 0.0–0.1)
Basophils Relative: 0 % (ref 0–1)
Eosinophils Absolute: 0.2 10*3/uL (ref 0.0–0.7)
Hemoglobin: 12.1 g/dL (ref 12.0–15.0)
MCH: 28.3 pg (ref 26.0–34.0)
MCHC: 33.4 g/dL (ref 30.0–36.0)
Monocytes Relative: 6 % (ref 3–12)
Neutro Abs: 6.3 10*3/uL (ref 1.7–7.7)
Neutrophils Relative %: 67 % (ref 43–77)
Platelets: 341 10*3/uL (ref 150–400)
RDW: 15 % (ref 11.5–15.5)

## 2012-12-14 NOTE — ED Notes (Signed)
Pt has been sick for 4 months with "heart issues', has been started on meds, and for the last month has been having sensation of heart racing and dizzy at times, the symptoms felt worse today. Denies any cp, no sob, +nausea at times.

## 2012-12-14 NOTE — ED Notes (Signed)
Pt reports for the past several weeks has felt her heart "flip flop" and then has left sided chest pain.  Pt says saw cardiologist and was put on propranolol.  Pt said it helped for a while but reports episodes are becoming more frequent.  Denies any SOB.

## 2012-12-14 NOTE — ED Provider Notes (Signed)
CSN: [id number]     Arrival date & time 12/14/12  1119 History  This chart was scribed for Geoffery Lyons, MD, by Yevette Edwards, ED Scribe. This patient was seen in room APA17/APA17 and the patient's care was started at 2:08 PM.   First MD Initiated Contact with Patient 12/14/12 1404     Chief Complaint  Patient presents with  . Tachycardia   (Consider location/radiation/quality/duration/timing/severity/associated sxs/prior Treatment) The history is provided by the patient. No language interpreter was used.   HPI Comments: Summer Carpenter is a 47 y.o. female who presents to the Emergency Department complaining of the heart palpations which has occurred intermittently this month, but which has increased today. She reports that the palpations are increased when recumbent, and that the episodes last less than a second. The pt denies experiencing any chest pain or SOB. She states that recently she consumed an increased amount of caffeine and has experienced an increased amount of stress. She states that she has an appointment with a cardiologist in 8 days; her cardiologist had given a prescription for propranolol but the medication has not resolved the palpations. She wore a heart monitor for two days, but there were no episodes during that interim.   [facility]; Dr. Royann Shivers Past Medical History  Diagnosis Date  . Hypertension   . Anxiety   . Hyperlipidemia   . GERD (gastroesophageal reflux disease)   . Prediabetes   . Palpitations   . Obesity   . Sleep apnea    Past Surgical History  Procedure Laterality Date  . Cholecystectomy    . Doppler echocardiography  07/30/2000 [place]    EF 55-66%,lv normal  . Nm myocar perf wall motion  10/22/2011  . Cardiac catheterization  11/25/2001    norm. LV fx,norm. abd aorta, both renal arteries patent   Family History  Problem Relation Age of Onset  . Hypertension Mother    History  Substance Use Topics  .  Smoking status: Never Smoker   . Smokeless tobacco: Never Used  . Alcohol Use: Yes     Comment: Occasionally, once a month   No OB history provided.  Review of Systems  Constitutional: Negative for fever and chills.  Respiratory: Negative for shortness of breath.   Cardiovascular: Positive for palpitations. Negative for chest pain.  Gastrointestinal: Positive for nausea.  All other systems reviewed and are negative.    Allergies  Septra  Home Medications   Current Outpatient Rx  Name  Route  Sig  Dispense  Refill  . aspirin EC 81 MG tablet   Oral   Take 81 mg by mouth daily.           . hydrochlorothiazide (MICROZIDE) 12.5 MG capsule   Oral   Take 12.5 mg by mouth daily.         Marland Kitchen ibuprofen (ADVIL,MOTRIN) 200 MG tablet   Oral   Take 200 mg by mouth as needed. For pain          . omeprazole (PRILOSEC) 20 MG capsule   Oral   Take 20 mg by mouth daily.         . propranolol ER (INDERAL LA) 60 MG 24 hr capsule   Oral   Take 1 capsule (60 mg total) by mouth daily.   90 capsule   3   . sertraline (ZOLOFT) 100 MG tablet   Oral   Take 50 mg by mouth at bedtime.          Marland Kitchen  simvastatin (ZOCOR) 10 MG tablet   Oral   Take 10 mg by mouth at bedtime.           Triage Vitals: BP 143/90  Pulse 76  Temp(Src) 98.4 F (36.9 C) (Oral)  Resp 20  Ht 5\' 7"  (1.702 m)  Wt 239 lb (108.41 kg)  BMI 37.42 kg/m2  SpO2 100%  LMP 11/16/2012 Physical Exam  Nursing note and vitals reviewed. Constitutional: She is oriented to person, place, and time. She appears well-developed and well-nourished. No distress.  HENT:  Head: Normocephalic and atraumatic.  Eyes: Conjunctivae and EOM are normal. Pupils are equal, round, and reactive to light.  Neck: Neck supple. No tracheal deviation present.  Cardiovascular: Normal rate and regular rhythm.   No murmur heard. Pulmonary/Chest: Effort normal and breath sounds normal. No respiratory distress.  Abdominal: Soft. Bowel sounds  are normal.  Musculoskeletal: Normal range of motion.  Neurological: She is alert and oriented to person, place, and time.  Skin: Skin is warm and dry.  Psychiatric: She has a normal mood and affect. Her behavior is normal.    ED Course  Procedures (including critical care time)  DIAGNOSTIC STUDIES: Oxygen Saturation is 100% on room air, normal by my interpretation.    COORDINATION OF CARE:  2:14 PM- Discussed treatment plan with patient, and the patient agreed to the plan.   Labs Review Labs Reviewed  BASIC METABOLIC PANEL - Abnormal; Notable for the following:    Glucose, Bld 105 (*)    GFR calc non Af Amer 64 (*)    GFR calc Af Amer 74 (*)    All other components within normal limits  TROPONIN I  CBC WITH DIFFERENTIAL   Imaging Review Dg Chest 2 View  12/14/2012   *RADIOLOGY REPORT*  Clinical Data: Tachycardia 1 week.  CHEST - 2 VIEW  Comparison: 12/25/2006  Findings: Lungs are clear.  Cardiomediastinal silhouette and remainder of the exam is unchanged.  IMPRESSION: No acute cardiopulmonary disease.   Original Report Authenticated By: Elberta Fortis, M.D.    Date: 12/14/2012  Rate: 71  Rhythm: normal sinus rhythm  QRS Axis: normal  Intervals: normal  ST/T Wave abnormalities: normal  Conduction Disutrbances:none  Narrative Interpretation:   Old EKG Reviewed: none available    MDM  No diagnosis found. Patient is a 47 year old female presents with complaints of palpitations. She's been evaluated by cardiology for this in the past her scribe propranolol. Holter monitor several months back was unremarkable. She presents today with increasing episodes of palpitations for the past few days. She denies any chest pain or shortness of breath. She does admit to some increased stress. Workup today reveals a normal EKG and normal laboratory studies. She appears stable and has a followup appointment the beginning of the week with her cardiologist. I have advised her to keep this  appointment return to the emergency department in the meantime if she develops chest pain or worsening symptoms.   I personally performed the services described in this documentation, which was scribed in my presence. The recorded information has been reviewed and is accurate.      Geoffery Lyons, MD 12/14/12 1655

## 2012-12-22 ENCOUNTER — Ambulatory Visit (INDEPENDENT_AMBULATORY_CARE_PROVIDER_SITE_OTHER): Payer: PRIVATE HEALTH INSURANCE | Admitting: Cardiovascular Disease

## 2012-12-22 ENCOUNTER — Encounter: Payer: Self-pay | Admitting: Cardiovascular Disease

## 2012-12-22 VITALS — BP 128/82 | HR 73 | Resp 16 | Ht 67.0 in | Wt 235.3 lb

## 2012-12-22 DIAGNOSIS — G4733 Obstructive sleep apnea (adult) (pediatric): Secondary | ICD-10-CM

## 2012-12-22 DIAGNOSIS — R002 Palpitations: Secondary | ICD-10-CM

## 2012-12-22 DIAGNOSIS — R000 Tachycardia, unspecified: Secondary | ICD-10-CM

## 2012-12-22 DIAGNOSIS — I10 Essential (primary) hypertension: Secondary | ICD-10-CM

## 2012-12-22 NOTE — Progress Notes (Signed)
[name], female   DOB: [birth year], 47 y.o.   MRN: [id number]      Reason for office visit Worsening palpitations  Summer Carpenter has recently been diagnosed with obstructive sleep apnea and is waiting to receive the equipment for CPAP therapy. Over last 3 weeks or so she has had worsening palpitations that occur at rest and are not assist her with exercise. She does not feel that they are interfering with her breathing and she does not have chest pain. She has mild exercise-induced dyspnea which she attributes to her weight. She has not had syncope. Propranolol seemed to improve the palpitations but recently they have increased in frequency. She thinks this is because she has been under a lot of "stress".   Allergies  Allergen Reactions  . Septra [Bactrim] Hives and Diarrhea    Current Outpatient Prescriptions  Medication Sig Dispense Refill  . aspirin EC 81 MG tablet Take 81 mg by mouth daily.        . hydrochlorothiazide (MICROZIDE) 12.5 MG capsule Take 12.5 mg by mouth daily.      Marland Kitchen ibuprofen (ADVIL,MOTRIN) 200 MG tablet Take 800 mg by mouth as needed. For pain      . omeprazole (PRILOSEC) 40 MG capsule Take 40 mg by mouth daily.      . propranolol ER (INDERAL LA) 60 MG 24 hr capsule Take 1 capsule (60 mg total) by mouth daily.  90 capsule  3  . sertraline (ZOLOFT) 100 MG tablet Take 50 mg by mouth at bedtime.       . simvastatin (ZOCOR) 10 MG tablet Take 10 mg by mouth at bedtime.        No current facility-administered medications for this visit.    Past Medical History  Diagnosis Date  . Hypertension   . Anxiety   . Hyperlipidemia   . GERD (gastroesophageal reflux disease)   . Prediabetes   . Palpitations   . Obesity   . Sleep apnea     Past Surgical History  Procedure Laterality Date  . Cholecystectomy    . Doppler echocardiography  07/30/2000 [place]    EF 55-66%,lv normal  . Nm myocar perf wall motion  10/22/2011  . Cardiac  catheterization  11/25/2001    norm. LV fx,norm. abd aorta, both renal arteries patent    Family History  Problem Relation Age of Onset  . Hypertension Mother     History   Social History  . Marital Status: Single    Spouse Name: N/A    Number of Children: N/A  . Years of Education: N/A   Occupational History  . Not on file.   Social History Main Topics  . Smoking status: Never Smoker   . Smokeless tobacco: Never Used  . Alcohol Use: Yes     Comment: Occasionally, once a month  . Drug Use: No  . Sexual Activity: No   Other Topics Concern  . Not on file   Social History Narrative  . No narrative on file    Review of systems: The patient specifically denies any chest pain at rest or with exertion, dyspnea at rest or with exertion, orthopnea, paroxysmal nocturnal dyspnea, syncope, palpitations, focal neurological deficits, intermittent claudication, lower extremity edema, unexplained weight gain, cough, hemoptysis or wheezing.  The patient also denies abdominal pain, nausea, vomiting, dysphagia, diarrhea, constipation, polyuria, polydipsia, dysuria, hematuria, frequency, urgency, abnormal bleeding or bruising, fever, chills, unexpected weight changes, mood swings, change in skin or hair  texture, change in voice quality, auditory or visual problems, allergic reactions or rashes, new musculoskeletal complaints other than usual "aches and pains".   PHYSICAL EXAM BP 128/82  Pulse 73  Resp 16  Ht 5\' 7"  (1.702 m)  Wt 235 lb 4.8 oz (106.731 kg)  BMI 36.84 kg/m2  LMP 11/16/2012  General: Alert, oriented x3, no distress, moderate to severely obese Head: no evidence of trauma, PERRL, EOMI, no exophtalmos or lid lag, no myxedema, no xanthelasma; normal ears, nose and crowded oropharynx Neck: normal jugular venous pulsations and no hepatojugular reflux; brisk carotid pulses without delay and no carotid bruits Chest: clear to auscultation, no signs of consolidation by percussion  or palpation, normal fremitus, symmetrical and full respiratory excursions Cardiovascular: normal position and quality of the apical impulse, regular rhythm, normal first and second heart sounds, no murmurs, rubs or gallops Abdomen: no tenderness or distention, no masses by palpation, no abnormal pulsatility or arterial bruits, normal bowel sounds, no hepatosplenomegaly Extremities: no clubbing, cyanosis or edema; 2+ radial, ulnar and brachial pulses bilaterally; 2+ right femoral, posterior tibial and dorsalis pedis pulses; 2+ left femoral, posterior tibial and dorsalis pedis pulses; no subclavian or femoral bruits Neurological: grossly nonfocal   EKG: Normal sinus rhythm, normal trace  Lipid Panel  No results found for this basename: chol, trig, hdl, cholhdl, vldl, ldlcalc    BMET    Component Value Date/Time   NA 140 12/14/2012 1255   K 3.5 12/14/2012 1255   CL 102 12/14/2012 1255   CO2 30 12/14/2012 1255   GLUCOSE 105* 12/14/2012 1255   BUN 11 12/14/2012 1255   CREATININE 1.03 12/14/2012 1255   CALCIUM 10.0 12/14/2012 1255   GFRNONAA 64* 12/14/2012 1255   GFRAA 74* 12/14/2012 1255     ASSESSMENT AND PLAN OSA (obstructive sleep apnea) Recent diagnosis. This could explain her mild breathlessness and the palpitations, via a right atrial enlargement. It would be useful to perform an echocardiogram to see if there are any signs of cor pulmonale or if we can detect pulmonary hypertension based on her tricuspid regurgitation jet. Weight loss is strongly recommended.  Essential hypertension Excellent control. There is probably some room for additional beta blocker therapy if this is needed for her palpitations. If necessary her hydrochlorothiazide can be discontinued to allow more beta blocker.  Palpitations We discussed options for palliation of this complaint. We decided to wait and see if treatment with CPAP will have a positive impact. If not I would increase the sustained release propranolol  dose.   Orders Placed This Encounter  Procedures  . EKG 12-Lead  . 2D Echocardiogram with contrast   No orders of the defined types were placed in this encounter.    Junious Silk, MD, [facility] [phone] office [phone] pager

## 2012-12-22 NOTE — Assessment & Plan Note (Signed)
We discussed options for palliation of this complaint. We decided to wait and see if treatment with CPAP will have a positive impact. If not I would increase the sustained release propranolol dose.

## 2012-12-22 NOTE — Assessment & Plan Note (Signed)
Excellent control. There is probably some room for additional beta blocker therapy if this is needed for her palpitations. If necessary her hydrochlorothiazide can be discontinued to allow more beta blocker.

## 2012-12-22 NOTE — Patient Instructions (Signed)
Your physician has requested that you have an echocardiogram. Echocardiography is a painless test that uses sound waves to create images of your heart. It provides your doctor with information about the size and shape of your heart and how well your heart's chambers and valves are working. This procedure takes approximately one hour. There are no restrictions for this procedure.  Your physician recommends that you schedule a follow-up appointment in: 6 months.  

## 2012-12-22 NOTE — Assessment & Plan Note (Signed)
Recent diagnosis. This could explain her mild breathlessness and the palpitations, via a right atrial enlargement. It would be useful to perform an echocardiogram to see if there are any signs of cor pulmonale or if we can detect pulmonary hypertension based on her tricuspid regurgitation jet. Weight loss is strongly recommended.

## 2012-12-29 ENCOUNTER — Ambulatory Visit (HOSPITAL_COMMUNITY)
Admission: RE | Admit: 2012-12-29 | Discharge: 2012-12-29 | Disposition: A | Discharge status: Home / Self Care | Payer: PRIVATE HEALTH INSURANCE | Source: Ambulatory Visit | Attending: Cardiovascular Disease | Admitting: Cardiovascular Disease

## 2012-12-29 DIAGNOSIS — R002 Palpitations: Secondary | ICD-10-CM

## 2012-12-29 DIAGNOSIS — G4733 Obstructive sleep apnea (adult) (pediatric): Secondary | ICD-10-CM

## 2012-12-29 NOTE — Progress Notes (Signed)
2D Echo Performed 12/29/2012    Edelmira Gallogly, RCS  

## 2012-12-30 ENCOUNTER — Telehealth: Payer: Self-pay | Admitting: Cardiovascular Disease

## 2012-12-30 NOTE — Telephone Encounter (Signed)
Patient is returning a call about her Echo results.  States that Britta Mccreedy called her?

## 2012-12-30 NOTE — Telephone Encounter (Signed)
Forwarded to B. Lassiter, CMA.  

## 2013-01-01 NOTE — Telephone Encounter (Signed)
LM w/echo results and told to call if any questions.

## 2013-01-05 ENCOUNTER — Telehealth: Payer: Self-pay | Admitting: Cardiovascular Disease

## 2013-01-05 NOTE — Telephone Encounter (Signed)
Returning your call. °

## 2013-01-05 NOTE — Telephone Encounter (Signed)
Please call-her medicine needs to be increased.

## 2013-01-05 NOTE — Telephone Encounter (Signed)
Returned call.  Left message to call back before 4pm.  PLEASE GET MORE DETAILS FROM PT WHEN SHE CALLS BACK.

## 2013-01-05 NOTE — Telephone Encounter (Signed)
Returned call.  Pt stated Dr. Salena Saner told her if she was having any problems she could call back and he would increase her propranolol.  Pt informed Dr. Salena Saner will be notified for further instructions.  Pt verbalized understanding and agreed w/ plan.  Pt uses [facility] [address]. [place], [place].  Message forwarded to Dr. Royann Shivers.

## 2013-01-05 NOTE — Telephone Encounter (Signed)
Returned call.  Left message to call back before 4pm.  

## 2013-01-05 NOTE — Telephone Encounter (Signed)
Increase Propranolol LA to 120 mg daily

## 2013-01-05 NOTE — Telephone Encounter (Signed)
Please call at her work#-She is returning your call.

## 2013-01-06 MED ORDER — PROPRANOLOL HCL ER 120 MG PO CP24: 120.0000 mg | ORAL_CAPSULE | Freq: Every day | ORAL | Status: DC

## 2013-01-06 NOTE — Telephone Encounter (Signed)
Call to pt and confirmed pt wants 90-day supply.  Rx sent to pharmacy.

## 2013-02-12 ENCOUNTER — Other Ambulatory Visit: Payer: Self-pay

## 2013-03-27 ENCOUNTER — Telehealth: Payer: Self-pay | Admitting: *Deleted

## 2013-03-27 NOTE — Telephone Encounter (Signed)
Faxed signed letter of medical necessity for sleep studies back to [employer].

## 2013-04-30 ENCOUNTER — Ambulatory Visit (INDEPENDENT_AMBULATORY_CARE_PROVIDER_SITE_OTHER): Payer: PRIVATE HEALTH INSURANCE | Admitting: Otolaryngology

## 2013-04-30 DIAGNOSIS — J343 Hypertrophy of nasal turbinates: Secondary | ICD-10-CM

## 2013-04-30 DIAGNOSIS — J342 Deviated nasal septum: Secondary | ICD-10-CM

## 2013-04-30 DIAGNOSIS — J310 Chronic rhinitis: Secondary | ICD-10-CM

## 2013-05-28 ENCOUNTER — Ambulatory Visit (INDEPENDENT_AMBULATORY_CARE_PROVIDER_SITE_OTHER): Payer: PRIVATE HEALTH INSURANCE | Admitting: Otolaryngology

## 2013-06-18 ENCOUNTER — Ambulatory Visit (INDEPENDENT_AMBULATORY_CARE_PROVIDER_SITE_OTHER): Payer: PRIVATE HEALTH INSURANCE | Admitting: Otolaryngology

## 2013-06-21 ENCOUNTER — Emergency Department (HOSPITAL_COMMUNITY)
Admission: EM | Admit: 2013-06-21 | Discharge: 2013-06-21 | Disposition: A | Discharge status: Home / Self Care | Payer: PRIVATE HEALTH INSURANCE | Attending: Emergency Medicine | Admitting: Emergency Medicine

## 2013-06-21 ENCOUNTER — Emergency Department (HOSPITAL_COMMUNITY): Payer: PRIVATE HEALTH INSURANCE

## 2013-06-21 ENCOUNTER — Encounter (HOSPITAL_COMMUNITY): Payer: Self-pay | Admitting: Emergency Medicine

## 2013-06-21 DIAGNOSIS — I10 Essential (primary) hypertension: Secondary | ICD-10-CM | POA: Insufficient documentation

## 2013-06-21 DIAGNOSIS — Z7982 Long term (current) use of aspirin: Secondary | ICD-10-CM | POA: Insufficient documentation

## 2013-06-21 DIAGNOSIS — R002 Palpitations: Secondary | ICD-10-CM | POA: Insufficient documentation

## 2013-06-21 DIAGNOSIS — Z79899 Other long term (current) drug therapy: Secondary | ICD-10-CM | POA: Insufficient documentation

## 2013-06-21 DIAGNOSIS — R42 Dizziness and giddiness: Secondary | ICD-10-CM | POA: Insufficient documentation

## 2013-06-21 DIAGNOSIS — E785 Hyperlipidemia, unspecified: Secondary | ICD-10-CM | POA: Insufficient documentation

## 2013-06-21 DIAGNOSIS — E669 Obesity, unspecified: Secondary | ICD-10-CM | POA: Insufficient documentation

## 2013-06-21 DIAGNOSIS — F411 Generalized anxiety disorder: Secondary | ICD-10-CM | POA: Insufficient documentation

## 2013-06-21 DIAGNOSIS — K219 Gastro-esophageal reflux disease without esophagitis: Secondary | ICD-10-CM | POA: Insufficient documentation

## 2013-06-21 DIAGNOSIS — Z862 Personal history of diseases of the blood and blood-forming organs and certain disorders involving the immune mechanism: Secondary | ICD-10-CM | POA: Insufficient documentation

## 2013-06-21 DIAGNOSIS — Z9889 Other specified postprocedural states: Secondary | ICD-10-CM | POA: Insufficient documentation

## 2013-06-21 DIAGNOSIS — Z8639 Personal history of other endocrine, nutritional and metabolic disease: Secondary | ICD-10-CM | POA: Insufficient documentation

## 2013-06-21 LAB — COMPREHENSIVE METABOLIC PANEL
ALT: 22 U/L (ref 0–35)
AST: 24 U/L (ref 0–37)
Albumin: 3.8 g/dL (ref 3.5–5.2)
Alkaline Phosphatase: 87 U/L (ref 39–117)
BUN: 12 mg/dL (ref 6–23)
CALCIUM: 9.6 mg/dL (ref 8.4–10.5)
CO2: 30 meq/L (ref 19–32)
CREATININE: 0.96 mg/dL (ref 0.50–1.10)
Chloride: 102 mEq/L (ref 96–112)
GFR, EST AFRICAN AMERICAN: 80 mL/min — AB (ref >90)
GFR, EST NON AFRICAN AMERICAN: 69 mL/min — AB (ref >90)
GLUCOSE: 115 mg/dL — AB (ref 70–99)
Potassium: 3.2 mEq/L — ABNORMAL LOW (ref 3.7–5.3)
SODIUM: 144 meq/L (ref 137–147)
TOTAL PROTEIN: 8 g/dL (ref 6.0–8.3)
Total Bilirubin: 0.6 mg/dL (ref 0.3–1.2)

## 2013-06-21 LAB — LIPASE, BLOOD: Lipase: 29 U/L (ref 11–59)

## 2013-06-21 LAB — URINALYSIS, ROUTINE W REFLEX MICROSCOPIC
Bilirubin Urine: NEGATIVE
Glucose, UA: NEGATIVE mg/dL
Hgb urine dipstick: NEGATIVE
Ketones, ur: NEGATIVE mg/dL
LEUKOCYTES UA: NEGATIVE
NITRITE: NEGATIVE
PH: 6.5 (ref 5.0–8.0)
Protein, ur: 30 mg/dL — AB
SPECIFIC GRAVITY, URINE: 1.025 (ref 1.005–1.030)
UROBILINOGEN UA: 0.2 mg/dL (ref 0.0–1.0)

## 2013-06-21 LAB — CBC WITH DIFFERENTIAL/PLATELET
Basophils Absolute: 0 10*3/uL (ref 0.0–0.1)
Basophils Relative: 0 % (ref 0–1)
EOS ABS: 0.3 10*3/uL (ref 0.0–0.7)
EOS PCT: 3 % (ref 0–5)
HEMATOCRIT: 35.3 % — AB (ref 36.0–46.0)
Hemoglobin: 11.7 g/dL — ABNORMAL LOW (ref 12.0–15.0)
LYMPHS ABS: 2.9 10*3/uL (ref 0.7–4.0)
Lymphocytes Relative: 30 % (ref 12–46)
MCH: 27.9 pg (ref 26.0–34.0)
MCHC: 33.1 g/dL (ref 30.0–36.0)
MCV: 84 fL (ref 78.0–100.0)
MONO ABS: 0.6 10*3/uL (ref 0.1–1.0)
Monocytes Relative: 6 % (ref 3–12)
Neutro Abs: 5.8 10*3/uL (ref 1.7–7.7)
Neutrophils Relative %: 61 % (ref 43–77)
PLATELETS: 351 10*3/uL (ref 150–400)
RBC: 4.2 MIL/uL (ref 3.87–5.11)
RDW: 15.6 % — ABNORMAL HIGH (ref 11.5–15.5)
WBC: 9.6 10*3/uL (ref 4.0–10.5)

## 2013-06-21 LAB — URINE MICROSCOPIC-ADD ON

## 2013-06-21 MED ORDER — PSEUDOEPHEDRINE HCL ER 120 MG PO TB12: 120.0000 mg | ORAL_TABLET | Freq: Two times a day (BID) | ORAL | Status: DC

## 2013-06-21 MED ORDER — SODIUM CHLORIDE 0.9 % IV BOLUS (SEPSIS)
1000.0000 mL | Freq: Once | INTRAVENOUS | Status: AC
  Administered 2013-06-21: 1000 mL via INTRAVENOUS

## 2013-06-21 MED ORDER — TRIAMCINOLONE ACETONIDE 55 MCG/ACT NA AERO: 2.0000 | INHALATION_SPRAY | Freq: Every day | NASAL | Status: DC

## 2013-06-21 NOTE — ED Notes (Signed)
Attempted iv X2 without success, another RN to attempt placement,

## 2013-06-21 NOTE — Discharge Instructions (Signed)
As discussed, your evaluation today has been largely reassuring.  But, it is important that you monitor your condition carefully, and do not hesitate to return to the ED if you develop new, or concerning changes in your condition. ? ?Otherwise, please follow-up with your physician for appropriate ongoing care. ? ?

## 2013-06-21 NOTE — ED Provider Notes (Signed)
CSN: [id number]     Arrival date & time 06/21/13  1458 History   First MD Initiated Contact with Patient 06/21/13 1515     Chief Complaint  Patient presents with  . Headache     (Consider location/radiation/quality/duration/timing/severity/associated sxs/prior Treatment) HPI Patient presents with dizziness, palpitations. Patient denies dyspnea, or chest pain. She notes that prior to the past 2 days she has been generally well, though she suffers from chronic sinus pressure. The past 2 days she's had multiple episodes of dizziness, typically with standing abruptly. Symptoms resolve after several moments, and are seemingly better in the afternoon or worse in the morning. There is mild associated nausea, no vomiting. No edema, rash, confusion, disorientation, weakness, falls, visual change, hearing change, ear pain, eye pain.  Past Medical History  Diagnosis Date  . Hypertension   . Anxiety   . Hyperlipidemia   . GERD (gastroesophageal reflux disease)   . Prediabetes   . Palpitations   . Obesity   . Sleep apnea    Past Surgical History  Procedure Laterality Date  . Cholecystectomy    . Doppler echocardiography  07/30/2000 [place]    EF 55-66%,lv normal  . Nm myocar perf wall motion  10/22/2011  . Cardiac catheterization  11/25/2001    norm. LV fx,norm. abd aorta, both renal arteries patent   Family History  Problem Relation Age of Onset  . Hypertension Mother    History  Substance Use Topics  . Smoking status: Never Smoker   . Smokeless tobacco: Never Used  . Alcohol Use: Yes     Comment: Occasionally, once a month   OB History   Grav Para Term Preterm Abortions TAB SAB Ect Mult Living                 Review of Systems  Constitutional:       Per HPI, otherwise negative  HENT:       Per HPI, otherwise negative  Respiratory:       Per HPI, otherwise negative  Cardiovascular:       Per HPI, otherwise negative  Gastrointestinal: Negative for vomiting.   Endocrine:       Negative aside from HPI  Genitourinary:       Neg aside from HPI   Musculoskeletal:       Per HPI, otherwise negative  Skin: Negative.   Neurological: Positive for dizziness. Negative for syncope.      Allergies  Septra  Home Medications   Current Outpatient Rx  Name  Route  Sig  Dispense  Refill  . aspirin EC 81 MG tablet   Oral   Take 81 mg by mouth daily.           . hydrochlorothiazide (MICROZIDE) 12.5 MG capsule   Oral   Take 12.5 mg by mouth daily.         Marland Kitchen ibuprofen (ADVIL,MOTRIN) 200 MG tablet   Oral   Take 800 mg by mouth as needed. For pain         . omeprazole (PRILOSEC) 40 MG capsule   Oral   Take 40 mg by mouth daily.         . propranolol ER (INDERAL LA) 120 MG 24 hr capsule   Oral   Take 1 capsule (120 mg total) by mouth daily.   90 capsule   3     Please note dose change. Thanks.   . sertraline (ZOLOFT) 100 MG tablet   Oral  Take 50 mg by mouth at bedtime.          . simvastatin (ZOCOR) 10 MG tablet   Oral   Take 10 mg by mouth at bedtime.           BP 120/80  Pulse 81  Temp(Src) 98.3 F (36.8 C) (Oral)  Resp 20  Ht 5\' 7"  (1.702 m)  Wt 232 lb (105.235 kg)  BMI 36.33 kg/m2  SpO2 96%  LMP 06/02/2013 Physical Exam  Nursing note and vitals reviewed. Constitutional: She is oriented to person, place, and time. She appears well-developed and well-nourished. No distress.  HENT:  Head: Normocephalic and atraumatic.  Eyes: Conjunctivae and EOM are normal.  Cardiovascular: Normal rate and regular rhythm.   Pulmonary/Chest: Effort normal and breath sounds normal. No stridor. No respiratory distress.  Abdominal: She exhibits no distension.  Musculoskeletal: She exhibits no edema.  Neurological: She is alert and oriented to person, place, and time. She displays no atrophy and no tremor. No cranial nerve deficit or sensory deficit. She exhibits normal muscle tone. She displays no seizure activity. Coordination  and gait normal.  Skin: Skin is warm and dry.  Psychiatric: She has a normal mood and affect. Her speech is normal and behavior is normal. Judgment normal. Cognition and memory are normal.    ED Course  Procedures (including critical care time) Labs Review Labs Reviewed  CBC WITH DIFFERENTIAL  COMPREHENSIVE METABOLIC PANEL  URINALYSIS, ROUTINE W REFLEX MICROSCOPIC  LIPASE, BLOOD   Imaging Review No results found.   EKG Interpretation   Date/Time:  Sunday June 21 2013 15:19:08 EDT Ventricular Rate:  85 PR Interval:  126 QRS Duration: 104 QT Interval:  370 QTC Calculation: 440 R Axis:   30 Text Interpretation:  Normal sinus rhythm with sinus arrhythmia Incomplete  right bundle branch block Borderline ECG When compared with ECG of  14-Dec-2012 12:06, No significant change was found Sinus rhythm Sinus  arrhythmia Right bundle branch block No significant change since last  tracing Abnormal ekg Confirmed by Carmin Muskrat  MD ([id number]) on 06/21/2013  3:37:55 PM     O2- 99%ra, nml  5:45 PM Patient sitting upright.  VSS  No new complaints.  We discussed all results, follow up instructions return precautions.   MDM   Final diagnoses:  Palpitations  Dizziness   patient presents with concern of ongoing headache, lightheadedness, with no dyspnea, no chest pain.  Patient has a history of chronic sinus issues, as well as seasonal allergies, and seems likely given the reassuring results from today's evaluation.  With no chest pain, distress, a nonischemic EKG, atypical ACS seems unlikely. Patient is no evidence for systemic infection. Patient has no neurologic deficits, making mass or cerebral venous thrombosis unlikely. Patient was discharged in stable condition to follow up with primary care. In addition, we discussed adjunctive methods for her sinus issues including acupuncture.     Carmin Muskrat, MD 06/21/13 [phone]

## 2013-06-21 NOTE — ED Notes (Signed)
Pt c/o ha and dizziness x 2 days with heart palpitations and sob today. Denies cp.

## 2013-06-21 NOTE — ED Notes (Signed)
Pt presents to er for further evaluation of palpations, has hx of same but they have gotten worse "lately" denies any n/v/d, states that she tends to notice the palpations worse when she is at rest, Dr Vanita Panda in prior to RN, see edp assessment for further,

## 2013-07-08 ENCOUNTER — Ambulatory Visit: Payer: PRIVATE HEALTH INSURANCE | Admitting: Cardiovascular Disease

## 2013-07-23 ENCOUNTER — Other Ambulatory Visit: Payer: Self-pay | Admitting: *Deleted

## 2013-07-23 MED ORDER — VALACYCLOVIR HCL 500 MG PO TABS: ORAL_TABLET | ORAL | Status: DC

## 2013-08-17 ENCOUNTER — Ambulatory Visit: Payer: PRIVATE HEALTH INSURANCE | Admitting: Cardiovascular Disease

## 2013-08-20 ENCOUNTER — Emergency Department (HOSPITAL_COMMUNITY): Payer: PRIVATE HEALTH INSURANCE

## 2013-08-20 ENCOUNTER — Emergency Department (HOSPITAL_COMMUNITY)
Admission: EM | Admit: 2013-08-20 | Discharge: 2013-08-20 | Disposition: A | Discharge status: Home / Self Care | Payer: PRIVATE HEALTH INSURANCE | Attending: Emergency Medicine | Admitting: Emergency Medicine

## 2013-08-20 ENCOUNTER — Telehealth: Payer: Self-pay | Admitting: Cardiovascular Disease

## 2013-08-20 ENCOUNTER — Other Ambulatory Visit: Payer: Self-pay

## 2013-08-20 ENCOUNTER — Encounter (HOSPITAL_COMMUNITY): Payer: Self-pay | Admitting: Emergency Medicine

## 2013-08-20 DIAGNOSIS — E669 Obesity, unspecified: Secondary | ICD-10-CM | POA: Insufficient documentation

## 2013-08-20 DIAGNOSIS — Z7982 Long term (current) use of aspirin: Secondary | ICD-10-CM | POA: Insufficient documentation

## 2013-08-20 DIAGNOSIS — Z79899 Other long term (current) drug therapy: Secondary | ICD-10-CM | POA: Insufficient documentation

## 2013-08-20 DIAGNOSIS — R002 Palpitations: Secondary | ICD-10-CM

## 2013-08-20 DIAGNOSIS — Z9889 Other specified postprocedural states: Secondary | ICD-10-CM | POA: Insufficient documentation

## 2013-08-20 DIAGNOSIS — I10 Essential (primary) hypertension: Secondary | ICD-10-CM | POA: Insufficient documentation

## 2013-08-20 DIAGNOSIS — I509 Heart failure, unspecified: Secondary | ICD-10-CM | POA: Insufficient documentation

## 2013-08-20 DIAGNOSIS — IMO0002 Reserved for concepts with insufficient information to code with codable children: Secondary | ICD-10-CM | POA: Insufficient documentation

## 2013-08-20 DIAGNOSIS — K219 Gastro-esophageal reflux disease without esophagitis: Secondary | ICD-10-CM | POA: Insufficient documentation

## 2013-08-20 DIAGNOSIS — F411 Generalized anxiety disorder: Secondary | ICD-10-CM | POA: Insufficient documentation

## 2013-08-20 DIAGNOSIS — E785 Hyperlipidemia, unspecified: Secondary | ICD-10-CM | POA: Insufficient documentation

## 2013-08-20 LAB — COMPREHENSIVE METABOLIC PANEL
ALBUMIN: 3.9 g/dL (ref 3.5–5.2)
ALT: 12 U/L (ref 0–35)
AST: 17 U/L (ref 0–37)
Alkaline Phosphatase: 80 U/L (ref 39–117)
BUN: 18 mg/dL (ref 6–23)
CALCIUM: 9.7 mg/dL (ref 8.4–10.5)
CO2: 29 mEq/L (ref 19–32)
Chloride: 99 mEq/L (ref 96–112)
Creatinine, Ser: 1.11 mg/dL — ABNORMAL HIGH (ref 0.50–1.10)
GFR calc Af Amer: 67 mL/min — ABNORMAL LOW (ref >90)
GFR calc non Af Amer: 58 mL/min — ABNORMAL LOW (ref >90)
Glucose, Bld: 105 mg/dL — ABNORMAL HIGH (ref 70–99)
Potassium: 3.7 mEq/L (ref 3.7–5.3)
Sodium: 140 mEq/L (ref 137–147)
Total Bilirubin: 0.6 mg/dL (ref 0.3–1.2)
Total Protein: 7.7 g/dL (ref 6.0–8.3)

## 2013-08-20 LAB — CBC
HEMATOCRIT: 33.2 % — AB (ref 36.0–46.0)
Hemoglobin: 10.8 g/dL — ABNORMAL LOW (ref 12.0–15.0)
MCH: 26.8 pg (ref 26.0–34.0)
MCHC: 32.5 g/dL (ref 30.0–36.0)
MCV: 82.4 fL (ref 78.0–100.0)
PLATELETS: 386 10*3/uL (ref 150–400)
RBC: 4.03 MIL/uL (ref 3.87–5.11)
RDW: 15.4 % (ref 11.5–15.5)
WBC: 11.9 10*3/uL — AB (ref 4.0–10.5)

## 2013-08-20 LAB — TROPONIN I: Troponin I: <0.3 ng/mL (ref <0.30)

## 2013-08-20 NOTE — Telephone Encounter (Signed)
Went to ER last night with increased palps....due to long wait she left..  Palps cause her to have anxiety attacks.  Symptoms have been occurring for a week.  She has an appointment with Dr. Loletha Grayer on May 28 but wonders if there is anything she can do until then.

## 2013-08-20 NOTE — Discharge Instructions (Signed)
As discussed, your evaluation today has been largely reassuring.  But, it is important that you monitor your condition carefully, and do not hesitate to return to the ED if you develop new, or concerning changes in your condition. ? ?Otherwise, please follow-up with your physician for appropriate ongoing care. ? ?

## 2013-08-20 NOTE — ED Provider Notes (Signed)
CSN: [id number]     Arrival date & time 08/20/13  1928 History  This chart was scribed for Carmin Muskrat, MD by Rolanda Lundborg, ED Scribe. This patient was seen in room APA02/APA02 and the patient's care was started at 7:37 PM.    Chief Complaint  Patient presents with  . Palpitations   The history is provided by the patient. No language interpreter was used.   HPI Comments: Summer Carpenter is a 48 y.o. female with a h/o palpitations, HTN, hyperlipidemia who presents to the Emergency Department complaining of palpitations onset one week ago that worsened tonight. She reports a h/o palpitations for which she takes medicine and states this feels the same but more frequent.  She states she thinks it may be related to her anxiety. She called her PCP and was told to come here. She also called her cardiologist and has a f/u appointment next week. She has a cardiologist because they thought she had CHF 12 years ago when she was pregnant but she does not have CHF. She states she has been walking more than usual but denies palpitations getting worse with exertion. She denies pain with deep breathing. No recent travels or changes in diet.    Past Medical History  Diagnosis Date  . Hypertension   . Anxiety   . Hyperlipidemia   . GERD (gastroesophageal reflux disease)   . Prediabetes   . Palpitations   . Obesity   . Sleep apnea    Past Surgical History  Procedure Laterality Date  . Cholecystectomy    . Doppler echocardiography  07/30/2000 [place]    EF 55-66%,lv normal  . Nm myocar perf wall motion  10/22/2011  . Cardiac catheterization  11/25/2001    norm. LV fx,norm. abd aorta, both renal arteries patent   Family History  Problem Relation Age of Onset  . Hypertension Mother    History  Substance Use Topics  . Smoking status: Never Smoker   . Smokeless tobacco: Never Used  . Alcohol Use: Yes     Comment: Occasionally, once a month   OB History   Grav Para Term Preterm  Abortions TAB SAB Ect Mult Living                 Review of Systems  Constitutional:       Per HPI, otherwise negative  HENT:       Per HPI, otherwise negative  Respiratory:       Per HPI, otherwise negative  Cardiovascular:       Per HPI, otherwise negative  Gastrointestinal: Negative for vomiting.  Endocrine:       Negative aside from HPI  Genitourinary:       Neg aside from HPI   Musculoskeletal:       Per HPI, otherwise negative  Skin: Negative.   Neurological: Negative for syncope.      Allergies  Septra  Home Medications   Prior to Admission medications   Medication Sig Start Date End Date Taking? Authorizing Provider  aspirin EC 81 MG tablet Take 81 mg by mouth daily.      Historical Provider, MD  hydrochlorothiazide (MICROZIDE) 12.5 MG capsule Take 12.5 mg by mouth daily.    Historical Provider, MD  ibuprofen (ADVIL,MOTRIN) 200 MG tablet Take 800 mg by mouth as needed. For pain    Historical Provider, MD  omeprazole (PRILOSEC) 40 MG capsule Take 40 mg by mouth daily.    Historical Provider, MD  propranolol ER (  INDERAL LA) 120 MG 24 hr capsule Take 1 capsule (120 mg total) by mouth daily. 01/06/13   Mihai Croitoru, MD  pseudoephedrine (SUDAFED 12 HOUR) 120 MG 12 hr tablet Take 1 tablet (120 mg total) by mouth 2 (two) times daily. 06/21/13   Carmin Muskrat, MD  sertraline (ZOLOFT) 100 MG tablet Take 50 mg by mouth at bedtime.     Historical Provider, MD  simvastatin (ZOCOR) 10 MG tablet Take 10 mg by mouth at bedtime.     Historical Provider, MD  triamcinolone (NASACORT AQ) 55 MCG/ACT AERO nasal inhaler Place 2 sprays into the nose daily. 06/21/13   Carmin Muskrat, MD  valACYclovir (VALTREX) 500 MG tablet Take one tablet by mouth once daily 07/23/13   Christin Fudge, CNM   BP 133/79  Pulse 81  Temp(Src) 98.1 F (36.7 C) (Oral)  Resp 24  Ht 5\' 7"  (1.702 m)  Wt 237 lb (107.502 kg)  BMI 37.11 kg/m2  SpO2 100%  LMP 08/17/2013 Physical Exam  Nursing  note and vitals reviewed. Constitutional: She is oriented to person, place, and time. She appears well-developed and well-nourished. No distress.  HENT:  Head: Normocephalic and atraumatic.  Eyes: Conjunctivae and EOM are normal.  Cardiovascular: Normal rate, regular rhythm and intact distal pulses.   Pulmonary/Chest: Effort normal and breath sounds normal. No stridor. No respiratory distress.  Abdominal: Soft. She exhibits no distension. There is no tenderness.  Musculoskeletal: She exhibits no edema.  Neurological: She is alert and oriented to person, place, and time. No cranial nerve deficit.  Skin: Skin is warm and dry.  Psychiatric: She has a normal mood and affect.    ED Course  Procedures (including critical care time) Medications - No data to display  COORDINATION OF CARE: 7:41 PM- Discussed treatment plan with pt which includes CXR, EKG, troponin, blood work. Pt agrees to plan.    Labs Review Labs Reviewed  CBC - Abnormal; Notable for the following:    WBC 11.9 (*)    Hemoglobin 10.8 (*)    HCT 33.2 (*)    All other components within normal limits  COMPREHENSIVE METABOLIC PANEL  TROPONIN I    Imaging Review Dg Chest Portable 1 View  08/20/2013   CLINICAL DATA:  Chest discomfort and palpitations.  EXAM: PORTABLE CHEST - 1 VIEW  COMPARISON:  06/21/2013  FINDINGS: The lungs are hypoinflated without consolidation or effusion. Cardiomediastinal silhouette and remainder of the exam is unchanged.  IMPRESSION: No active disease.   Electronically Signed   By: Marin Olp M.D.   On: 08/20/2013 20:03    ECG  SR 76 Artefact Abnormal   O2- 99%ra, nml  On re-exam the patient is in no distress.    MDM  Patient resents with one week of palpitations.  On exam patient is awake, alert, hemodynamically stable, and in no distress.  Patient's evaluation here is largely reassuring, with no evidence of ongoing ischemia, nor substantial electrolyte abnormality.  Patient has a  primary care physician and a cardiologist was and she can followup period with minimal risk profile, low timi score, there is low suspicion for atypical ACS.  Patient does have mild leukocytosis, but is afebrile, and there's no evidence for ongoing infection. Patient is also mildly anemic.  She will follow up with primary care in regards to this.   I personally performed the services described in this documentation, which was scribed in my presence. The recorded information has been reviewed and is accurate.  Carmin Muskrat, MD 08/20/13 2036

## 2013-08-20 NOTE — ED Notes (Signed)
Patient c/o palpitations x 1 week; states called her cardiologist and has an appointment for next Friday.

## 2013-08-20 NOTE — Telephone Encounter (Signed)
Having increasing palpitations over the past week lasting only a second with each episode.  Denies caffeine products.  States she has anxiety attacks with she has the palps.  Called her PCP who told her she could be seen today and they would send her to the ER - she declined and called here.  Willing to see an extender next week.  Told to call if palpitations became worse.  Voiced understanding.

## 2013-08-21 NOTE — Telephone Encounter (Signed)
Closed encounter °

## 2013-08-27 ENCOUNTER — Ambulatory Visit: Payer: PRIVATE HEALTH INSURANCE | Admitting: Cardiology

## 2013-08-28 ENCOUNTER — Encounter: Payer: Self-pay | Admitting: Cardiology

## 2013-08-28 ENCOUNTER — Ambulatory Visit (INDEPENDENT_AMBULATORY_CARE_PROVIDER_SITE_OTHER): Payer: PRIVATE HEALTH INSURANCE | Admitting: Cardiology

## 2013-08-28 VITALS — BP 126/78 | HR 60 | Ht 67.0 in | Wt 238.4 lb

## 2013-08-28 DIAGNOSIS — E876 Hypokalemia: Secondary | ICD-10-CM

## 2013-08-28 DIAGNOSIS — D649 Anemia, unspecified: Secondary | ICD-10-CM

## 2013-08-28 DIAGNOSIS — R002 Palpitations: Secondary | ICD-10-CM

## 2013-08-28 LAB — IRON AND TIBC
%SAT: 9 % — ABNORMAL LOW (ref 20–55)
Iron: 43 ug/dL (ref 42–145)
TIBC: 491 ug/dL — AB (ref 250–470)
UIBC: 448 ug/dL — AB (ref 125–400)

## 2013-08-28 LAB — TSH: TSH: 1.22 u[IU]/mL (ref 0.350–4.500)

## 2013-08-28 LAB — FERRITIN: Ferritin: 5 ng/mL — ABNORMAL LOW (ref 10–291)

## 2013-08-28 LAB — MAGNESIUM: Magnesium: 1.9 mg/dL (ref 1.5–2.5)

## 2013-08-28 LAB — FOLATE: FOLATE: 14.6 ng/mL

## 2013-08-28 LAB — VITAMIN B12: VITAMIN B 12: 378 pg/mL (ref 211–911)

## 2013-08-28 MED ORDER — POTASSIUM CHLORIDE ER 8 MEQ PO TBCR: 8.0000 meq | EXTENDED_RELEASE_TABLET | Freq: Every day | ORAL | Status: DC

## 2013-08-28 NOTE — Patient Instructions (Signed)
Have lab work done today.  I added low dose potassium to your meds.  We will call lab results Monday, if stable then we will reschedule your appt. With Dr. Sallyanne Kuster for 4 months.

## 2013-08-28 NOTE — Assessment & Plan Note (Signed)
Will add daily K+ to meds see above.

## 2013-08-28 NOTE — Assessment & Plan Note (Signed)
H/H 10.8/33.2, denies any blood in stools or urine.  Will check anemia panel.  Possible cause of palpitations.

## 2013-08-28 NOTE — Progress Notes (Signed)
08/28/2013   PCP: Ames Dura, MD   Chief Complaint  Patient presents with  . Palpitations    Have been becoming more frequent - to a couple of times per week. Otherwise, no complaints.    Primary Cardiologist: Dr. Sallyanne Kuster  HPI:  48 year old AAF presents with increased episodes of palpitations.  She also has OSA on CPAP.  Her palpitations are treated with Inderal LA and this was increased to 120 mg last Sept.  Hx of fairly normal echo in Sept 2014.   The episodes were occuring more frequently but was seen in ER on the 8th and after lab work she was reassured.  She is 67 and still with regular monthly periods, but she believes she may be perimenopausal.   She has cut out all caffeine.   Her labs reveal some anemia.  Her previous K+ was low at 3.2 and this was replaced she is on diuretic.  No chest pain, no SOB.  No lightheadedness.    Allergies  Allergen Reactions  . Septra [Bactrim] Hives and Diarrhea    Current Outpatient Prescriptions  Medication Sig Dispense Refill  . aspirin EC 81 MG tablet Take 81 mg by mouth daily.        . hydrochlorothiazide (HYDRODIURIL) 25 MG tablet Take 25 mg by mouth daily.      Marland Kitchen omeprazole (PRILOSEC) 20 MG capsule Take 20 mg by mouth daily.      . propranolol ER (INDERAL LA) 120 MG 24 hr capsule Take 1 capsule (120 mg total) by mouth daily.  90 capsule  3  . sertraline (ZOLOFT) 50 MG tablet Take 50 mg by mouth daily.      . simvastatin (ZOCOR) 10 MG tablet Take 10 mg by mouth at bedtime.       . valACYclovir (VALTREX) 500 MG tablet Take 500 mg by mouth as needed. Cold sore sx      . potassium chloride (KLOR-CON) 8 MEQ tablet Take 1 tablet (8 mEq total) by mouth daily.  30 tablet  6   No current facility-administered medications for this visit.    Past Medical History  Diagnosis Date  . Hypertension   . Anxiety   . Hyperlipidemia   . GERD (gastroesophageal reflux disease)   . Prediabetes   . Palpitations   . Obesity   .  Sleep apnea     Past Surgical History  Procedure Laterality Date  . Cholecystectomy    . Doppler echocardiography  07/30/2000 [place]    EF 55-66%,lv normal  . Nm myocar perf wall motion  10/22/2011  . Cardiac catheterization  11/25/2001    norm. LV fx,norm. abd aorta, both renal arteries patent    VPX:TGGYIRS:WN colds or fevers, no weight changes Skin:no rashes or ulcers HEENT:no blurred vision, no congestion CV:see HPI PUL:see HPI GI:no diarrhea constipation or melena, no indigestion GU:no hematuria, no dysuria MS:no joint pain, no claudication Neuro:no syncope, no lightheadedness Endo:no diabetes, no thyroid disease GYN: continues with regular monthly menses    PHYSICAL EXAM BP 126/78  Pulse 60  Ht 5\' 7"  (1.702 m)  Wt 238 lb 6.4 oz (108.138 kg)  BMI 37.33 kg/m2  LMP 08/17/2013 General:Pleasant affect, NAD Skin:Warm and dry, brisk capillary refill HEENT:normocephalic, sclera clear, mucus membranes moist Neck:supple, no JVD, no bruits  Heart:S1S2 RRR without murmur, gallup, rub or click Lungs:clear without rales, rhonchi, or wheezes IOE:VOJJ, non tender, + BS, do not palpate liver spleen  or masses Ext:no lower ext edema, 2+ pedal pulses, 2+ radial pulses Neuro:alert and oriented, MAE, follows commands, + facial symmetry  EKG:SR with incomplete RBBB, no acute changes.  ASSESSMENT AND PLAN Palpitations Had been having more frequent episodes, but after ER visit they are reduced. 3-4 episodes a day some only last a few seconds.  Labs in ER and previous reveal some hypokalemia and anemia.  Will do anemia panel, TSH, Mg+ level and add slow K+ 8 meq to daily meds.  If labs are normal then we will reschedule her appt. With Dr. Sallyanne Kuster in 4 months.  She will follow up with her PCP for possible perimenopausal symptoms.      Hypokalemia Will add daily K+ to meds see above.  Anemia H/H 10.8/33.2, denies any blood in stools or urine.  Will check anemia panel.  Possible  cause of palpitations.

## 2013-08-28 NOTE — Assessment & Plan Note (Signed)
Had been having more frequent episodes, but after ER visit they are reduced. 3-4 episodes a day some only last a few seconds.  Labs in ER and previous reveal some hypokalemia and anemia.  Will do anemia panel, TSH, Mg+ level and add slow K+ 8 meq to daily meds.  If labs are normal then we will reschedule her appt. With Dr. Sallyanne Kuster in 4 months.  She will follow up with her PCP for possible perimenopausal symptoms.

## 2013-09-01 ENCOUNTER — Telehealth: Payer: Self-pay | Admitting: Cardiology

## 2013-09-01 NOTE — Telephone Encounter (Signed)
RN INFORMED PATIENT OF LAB RESULT . SHE STATES SHE TEH INFORMATION IN MY CHART. SHE IS AWARE TO START IRON TABLET DAILY AND FOLLOW UP WITH PCP. SHE CANCELLED UPCOMING APPOINTMENT WITH DR CROITORU - SINCE SHE WILL F/U WITH PCP

## 2013-09-01 NOTE — Telephone Encounter (Signed)
LEFT MESSAGE TO CALL BACK FOR RESULTS  PER LAURA, LABS A LITTLE LOW AND TO FOLLOW UP WITH PCP, BUT TO START fesol 325 daily.

## 2013-09-01 NOTE — Telephone Encounter (Signed)
Patient states that she was seen on Friday and was told that she would be called today with her lab results---she has not heard anything and wants to know if she needs to keep her appointment at the end of this week.

## 2013-09-01 NOTE — Telephone Encounter (Signed)
Ok that is fine

## 2013-09-03 ENCOUNTER — Telehealth: Payer: Self-pay | Admitting: Cardiovascular Disease

## 2013-09-03 ENCOUNTER — Ambulatory Visit: Payer: PRIVATE HEALTH INSURANCE | Admitting: Cardiovascular Disease

## 2013-09-03 NOTE — Progress Notes (Signed)
Pt. Stated she already gotten her results and was taking the iron tabs

## 2013-09-03 NOTE — Telephone Encounter (Signed)
FORWARD TO JC ,LPN

## 2013-09-03 NOTE — Progress Notes (Signed)
Pt. Called and informed of her lab results

## 2013-09-03 NOTE — Telephone Encounter (Signed)
Returning Call From [place] .

## 2013-09-08 ENCOUNTER — Other Ambulatory Visit (HOSPITAL_COMMUNITY)
Admission: RE | Admit: 2013-09-08 | Discharge: 2013-09-08 | Disposition: A | Discharge status: Home / Self Care | Payer: PRIVATE HEALTH INSURANCE | Source: Ambulatory Visit | Attending: Advanced Practice Midwife | Admitting: Advanced Practice Midwife

## 2013-09-08 ENCOUNTER — Ambulatory Visit (INDEPENDENT_AMBULATORY_CARE_PROVIDER_SITE_OTHER): Payer: PRIVATE HEALTH INSURANCE | Admitting: Obstetrics and Gynecology

## 2013-09-08 ENCOUNTER — Encounter: Payer: Self-pay | Admitting: Obstetrics and Gynecology

## 2013-09-08 VITALS — BP 122/80 | Ht 67.25 in | Wt 236.8 lb

## 2013-09-08 DIAGNOSIS — Z113 Encounter for screening for infections with a predominantly sexual mode of transmission: Secondary | ICD-10-CM | POA: Insufficient documentation

## 2013-09-08 DIAGNOSIS — Z01419 Encounter for gynecological examination (general) (routine) without abnormal findings: Secondary | ICD-10-CM

## 2013-09-08 DIAGNOSIS — Z1151 Encounter for screening for human papillomavirus (HPV): Secondary | ICD-10-CM | POA: Insufficient documentation

## 2013-09-08 DIAGNOSIS — Z1212 Encounter for screening for malignant neoplasm of rectum: Secondary | ICD-10-CM

## 2013-09-08 DIAGNOSIS — N760 Acute vaginitis: Secondary | ICD-10-CM

## 2013-09-08 LAB — HEMOCCULT GUIAC POC 1CARD (OFFICE): Fecal Occult Blood, POC: NEGATIVE

## 2013-09-08 MED ORDER — METRONIDAZOLE 500 MG PO TABS: 500.0000 mg | ORAL_TABLET | Freq: Two times a day (BID) | ORAL | Status: DC

## 2013-09-08 MED ORDER — METRONIDAZOLE 0.75 % VA GEL: 1.0000 | Freq: Every day | VAGINAL | Status: DC

## 2013-09-08 NOTE — Progress Notes (Signed)
Subjective:    Subjective:     Summer Carpenter is a 48 y.o. female here for a routine exam.  Current complaints: none .  Personal health questionnaire reviewed: no.   Gynecologic History Patient's last menstrual period was 08/24/2013. Contraception:  Last Pap: . Results were:  Last mammogram:  XC=CAT i. Results were: normal  Obstetric History OB History  No data available       Review of Systems  Review of Systems  Constitutional: Negative for fever, chills, weight loss, malaise/fatigue and diaphoresis.  HENT: Negative for hearing loss, ear pain, nosebleeds, congestion, sore throat, neck pain, tinnitus and ear discharge.   Eyes: Negative for blurred vision, double vision, photophobia, pain, discharge and redness.  Respiratory: Negative for cough, hemoptysis, sputum production, shortness of breath, wheezing and stridor.   Cardiovascular: Negative for chest pain, palpitations, orthopnea, claudication, leg swelling and PND.  Gastrointestinal: negative for abdominal pain. Negative for heartburn, nausea, vomiting, diarrhea, constipation, blood in stool and melena.  Genitourinary: Negative for dysuria, urgency, frequency, hematuria and flank pain.  Musculoskeletal: Negative for myalgias, back pain, joint pain and falls.  Skin: Negative for itching and rash.  Neurological: Negative for dizziness, tingling, tremors, sensory change, speech change, focal weakness, seizures, loss of consciousness, weakness and headaches.  Endo/Heme/Allergies: Negative for environmental allergies and polydipsia. Does not bruise/bleed easily.  Psychiatric/Behavioral: Negative for depression, suicidal ideas, hallucinations, memory loss and substance abuse. The patient is not nervous/anxious and does not have insomnia.        Objective:    Physical Exam  Vitals reviewed. Constitutional: She is oriented to person, place, and time. She appears well-developed and well-nourished.  HENT:  Head:  Normocephalic and atraumatic.        Right Ear: External ear normal.  Left Ear: External ear normal.  Nose: Nose normal.  Mouth/Throat: Oropharynx is clear and moist.  Eyes: Conjunctivae and EOM are normal. Pupils are equal, round, and reactive to light. Right eye exhibits no discharge. Left eye exhibits no discharge. No scleral icterus.  Neck: Normal range of motion. Neck supple. No tracheal deviation present. No thyromegaly present.  Cardiovascular: Normal rate, regular rhythm, normal heart sounds and intact distal pulses.  Exam reveals no gallop and no friction rub.   No murmur heard. Respiratory: Effort normal and breath sounds normal. No respiratory distress. She has no wheezes. She has no rales. She exhibits no tenderness.  GI: Soft. Bowel sounds are normal. She exhibits no distension and no mass. There is no tenderness. There is no rebound and no guarding.  Genitourinary:       Vulva is normal without lesions Vagina is pink moist without discharge Cervix normal in appearance and pap is done Uterus is normal size shape and contour Adnexa is negative with normal sized ovaries by sonogram  Musculoskeletal: Normal range of motion. She exhibits no edema and no tenderness.  Neurological: She is alert and oriented to person, place, and time. She has normal reflexes. She displays normal reflexes. No cranial nerve deficit. She exhibits normal muscle tone. Coordination normal.  Skin: Skin is warm and dry. No rash noted. No erythema. No pallor.  Psychiatric: She has a normal mood and affect. Her behavior is normal. Judgment and thought content normal.       Assessment:    Healthy female exam.  BV Desires STD check   Plan:  Rx metronidazole Rx metrogel  Education reviewed: none.

## 2013-09-08 NOTE — Patient Instructions (Signed)
Mammogram every year Pap every 3 yr

## 2013-09-09 ENCOUNTER — Telehealth: Payer: Self-pay | Admitting: *Deleted

## 2013-09-09 LAB — RPR

## 2013-09-09 LAB — HIV ANTIBODY (ROUTINE TESTING W REFLEX): HIV: NONREACTIVE

## 2013-09-09 LAB — HEPATITIS C ANTIBODY: HCV Ab: NEGATIVE

## 2013-09-09 NOTE — Telephone Encounter (Signed)
Pt informed of negative labs including HIV 1&2, RPR, Hep C. Pt informed pap pending.

## 2013-09-11 LAB — CYTOLOGY - PAP

## 2013-09-13 ENCOUNTER — Encounter: Payer: Self-pay | Admitting: Advanced Practice Midwife

## 2013-09-23 ENCOUNTER — Telehealth: Payer: Self-pay | Admitting: Cardiovascular Disease

## 2013-09-23 NOTE — Telephone Encounter (Signed)
Returned call to patient. She reports she is concerned her potassium pills (61mEq) that she has been taking since last OV 5/22 are making her BP low. She reports her BP when she checks it is around 125/60-65 (similar to VS at last OV) RN informed patient her BP is normal and she should call us if her top number (systolic) gets near 163. Patient voiced understanding.

## 2013-09-23 NOTE — Telephone Encounter (Signed)
Calling about her potassium pills .Marland Kitchen Since she started taking them her bp has been low . Wants to know what she do?  Please call   Thanks

## 2013-09-29 ENCOUNTER — Other Ambulatory Visit: Payer: Self-pay | Admitting: Obstetrics and Gynecology

## 2013-09-29 DIAGNOSIS — Z1231 Encounter for screening mammogram for malignant neoplasm of breast: Secondary | ICD-10-CM

## 2013-10-14 ENCOUNTER — Telehealth: Payer: Self-pay | Admitting: Cardiovascular Disease

## 2013-10-14 ENCOUNTER — Telehealth: Payer: Self-pay | Admitting: *Deleted

## 2013-10-14 NOTE — Telephone Encounter (Signed)
Faxed CPAP referral and records to [facility].@ [phone].

## 2013-10-14 NOTE — Telephone Encounter (Signed)
Pt says she need a C-Pap machine,she wants to know how she can get one.

## 2013-10-14 NOTE — Telephone Encounter (Signed)
Returned a call to patient. She is requesting for [place] to get her set up for CPAP therapy. She never was set up for her treatment. Informed her that  i will see what  i need to do and do a referral for her to [facility] if  Her insurance will allow [place] to use them. Patient voiced understanding.

## 2013-10-15 ENCOUNTER — Other Ambulatory Visit: Payer: Self-pay | Admitting: Obstetrics and Gynecology

## 2013-10-15 DIAGNOSIS — N760 Acute vaginitis: Principal | ICD-10-CM

## 2013-10-15 DIAGNOSIS — B9689 Other specified bacterial agents as the cause of diseases classified elsewhere: Secondary | ICD-10-CM

## 2013-10-19 ENCOUNTER — Other Ambulatory Visit: Payer: Self-pay | Admitting: Obstetrics and Gynecology

## 2013-10-19 MED ORDER — METRONIDAZOLE 500 MG PO TABS: 500.0000 mg | ORAL_TABLET | Freq: Two times a day (BID) | ORAL | Status: AC

## 2013-10-19 MED ORDER — METRONIDAZOLE 500 MG PO TABS: 500.0000 mg | ORAL_TABLET | Freq: Two times a day (BID) | ORAL | Status: DC

## 2013-10-19 NOTE — Telephone Encounter (Signed)
refil escribed this date .me Jonnie Kind, MD

## 2013-10-27 MED ORDER — METRONIDAZOLE 500 MG PO TABS: 500.0000 mg | ORAL_TABLET | Freq: Two times a day (BID) | ORAL | Status: DC

## 2013-10-27 NOTE — Telephone Encounter (Signed)
Metronidazole refilled x 1 wk.

## 2013-10-27 NOTE — Addendum Note (Signed)
Addended by: Jonnie Kind on: 10/27/2013 01:39 PM   Modules accepted: Orders

## 2013-11-09 ENCOUNTER — Ambulatory Visit (HOSPITAL_COMMUNITY)
Admission: RE | Admit: 2013-11-09 | Discharge: 2013-11-09 | Disposition: A | Discharge status: Home / Self Care | Payer: PRIVATE HEALTH INSURANCE | Source: Ambulatory Visit | Attending: Obstetrics and Gynecology | Admitting: Obstetrics and Gynecology

## 2013-11-09 DIAGNOSIS — Z1231 Encounter for screening mammogram for malignant neoplasm of breast: Secondary | ICD-10-CM | POA: Insufficient documentation

## 2013-11-18 ENCOUNTER — Telehealth: Payer: Self-pay | Admitting: Cardiovascular Disease

## 2013-11-18 NOTE — Telephone Encounter (Signed)
Returned call to patient she stated she has felt bad no energy.Stated she had EMS check her B/P this morning 146/90,135/86.Stated she was advised to check with Dr.Croitoru and see if she needs to increase medications.Will check with Dr.Croitoru and call her back.

## 2013-11-18 NOTE — Telephone Encounter (Signed)
New Prob    Requesting a call from a nurse regarding blood pressure. Please call.

## 2013-11-18 NOTE — Telephone Encounter (Signed)
Returned call to patient no answer.Left message on personal voice mail spoke to Dr.Croitoru he advised B/P readings ok.Continue same medication.

## 2013-11-23 ENCOUNTER — Telehealth: Payer: Self-pay | Admitting: *Deleted

## 2013-11-23 NOTE — Telephone Encounter (Signed)
Faxed CPAP supply order back to [facility].

## 2013-11-25 ENCOUNTER — Encounter: Payer: Self-pay | Admitting: Cardiovascular Disease

## 2013-11-26 ENCOUNTER — Telehealth: Payer: Self-pay | Admitting: *Deleted

## 2013-11-26 NOTE — Telephone Encounter (Signed)
Message copied by Tressa Busman on Thu Nov 26, 2013  2:35 PM ------      Message from: Sanda Klein      Created: Thu Nov 26, 2013  2:24 PM       Please tell her I looked at the new EKG and compared it with the old one. I see no difference whatsoever. The rhythm on the current EKG is normal. (The computer had some difficulty in analysis). I see no problem.      MCr ------

## 2013-11-26 NOTE — Telephone Encounter (Signed)
LM - Dr. Loletha Grayer reviewed the EKG's she sent over - there is no change from previous EKGs.

## 2013-11-27 ENCOUNTER — Encounter: Payer: Self-pay | Admitting: Cardiovascular Disease

## 2013-11-30 ENCOUNTER — Ambulatory Visit (INDEPENDENT_AMBULATORY_CARE_PROVIDER_SITE_OTHER): Payer: PRIVATE HEALTH INSURANCE | Admitting: Cardiovascular Disease

## 2013-11-30 ENCOUNTER — Encounter: Payer: Self-pay | Admitting: Cardiovascular Disease

## 2013-11-30 VITALS — BP 130/70 | HR 72 | Ht 67.0 in | Wt 240.1 lb

## 2013-11-30 DIAGNOSIS — E876 Hypokalemia: Secondary | ICD-10-CM

## 2013-11-30 DIAGNOSIS — E669 Obesity, unspecified: Secondary | ICD-10-CM

## 2013-11-30 DIAGNOSIS — R002 Palpitations: Secondary | ICD-10-CM

## 2013-11-30 DIAGNOSIS — Z79899 Other long term (current) drug therapy: Secondary | ICD-10-CM

## 2013-11-30 DIAGNOSIS — G4733 Obstructive sleep apnea (adult) (pediatric): Secondary | ICD-10-CM

## 2013-11-30 DIAGNOSIS — I10 Essential (primary) hypertension: Secondary | ICD-10-CM

## 2013-11-30 MED ORDER — TRIAMTERENE-HCTZ 37.5-25 MG PO CAPS: ORAL_CAPSULE | ORAL | Status: DC

## 2013-11-30 NOTE — Progress Notes (Signed)
[name], female   DOB: [birth year], 48 y.o.   MRN: [id number]      Reason for office visit Palpitations  Summer Carpenter was recently seen in [place] with complaints of a racing heartbeat. An electrocardiogram was performed and the automatic interpretation suggested that she had atrial rhythm. On my review she seems to simply have sinus rhythm. She continues to complain of palpitations when she turns over in bed and when she brushes her hair. Previous monitors have not shown any evidence of complex arrhythmia. She also complains of fatigue she states began when she was started on potassium supplements. She takes hydrochlorothiazide both for systemic hypertension and for ankle swelling. She has had repeated problems with mild hypokalemia that appears to worsen her palpitations.   Allergies  Allergen Reactions  . Septra [Bactrim] Hives and Diarrhea   Current Outpatient Prescriptions on File Prior to Visit  Medication Sig Dispense Refill  . aspirin EC 81 MG tablet Take 81 mg by mouth daily.        Marland Kitchen ibuprofen (ADVIL,MOTRIN) 200 MG tablet Take 200 mg by mouth every 6 (six) hours as needed (pain).       Marland Kitchen omeprazole (PRILOSEC) 20 MG capsule Take 20 mg by mouth daily.      . propranolol ER (INDERAL LA) 120 MG 24 hr capsule Take 1 capsule (120 mg total) by mouth daily.  90 capsule  3  . sertraline (ZOLOFT) 50 MG tablet Take 50 mg by mouth daily.      . simvastatin (ZOCOR) 10 MG tablet Take 10 mg by mouth at bedtime.       . valACYclovir (VALTREX) 500 MG tablet Take 500 mg by mouth as needed (cold sores).        No current facility-administered medications on file prior to visit.      Past Medical History  Diagnosis Date  . Hypertension   . Anxiety   . Hyperlipidemia   . GERD (gastroesophageal reflux disease)   . Prediabetes   . Palpitations   . Obesity   . Sleep apnea     Past Surgical History  Procedure Laterality Date  . Cholecystectomy    . Doppler  echocardiography  07/30/2000 [place]    EF 55-66%,lv normal  . Nm myocar perf wall motion  10/22/2011  . Cardiac catheterization  11/25/2001    norm. LV fx,norm. abd aorta, both renal arteries patent  . Oophorectomy  2008    lt    Family History  Problem Relation Age of Onset  . Hypertension Mother   . Seizures Father   . Hypertension Brother   . Hypertension Brother   . Hypertension Sister     History   Social History  . Marital Status: Single    Spouse Name: N/A    Number of Children: N/A  . Years of Education: N/A   Occupational History  . Not on file.   Social History Main Topics  . Smoking status: Never Smoker   . Smokeless tobacco: Never Used  . Alcohol Use: Yes     Comment: Occasionally, once a month  . Drug Use: No  . Sexual Activity: Yes   Other Topics Concern  . Not on file   Social History Narrative  . No narrative on file    Review of systems: The patient specifically denies any chest pain at rest or with exertion, dyspnea at rest or with exertion, orthopnea, paroxysmal nocturnal dyspnea, syncope, focal neurological deficits, intermittent claudication,  unexplained weight gain, cough, hemoptysis or wheezing.  The patient also denies abdominal pain, nausea, vomiting, dysphagia, diarrhea, constipation, polyuria, polydipsia, dysuria, hematuria, frequency, urgency, abnormal bleeding or bruising, fever, chills, unexpected weight changes, mood swings, change in skin or hair texture, change in voice quality, auditory or visual problems, allergic reactions or rashes, new musculoskeletal complaints other than usual "aches and pains".   PHYSICAL EXAM BP 130/70  Pulse 72  Ht 5\' 7"  (1.702 m)  Wt 240 lb 1.6 oz (108.909 kg)  BMI 37.60 kg/m2  LMP 11/03/2013  General: Alert, oriented x3, no distress Head: no evidence of trauma, PERRL, EOMI, no exophtalmos or lid lag, no myxedema, no xanthelasma; normal ears, nose and oropharynx Neck: normal jugular venous  pulsations and no hepatojugular reflux; brisk carotid pulses without delay and no carotid bruits Chest: clear to auscultation, no signs of consolidation by percussion or palpation, normal fremitus, symmetrical and full respiratory excursions Cardiovascular: normal position and quality of the apical impulse, regular rhythm, normal first and second heart sounds, no murmurs, rubs or gallops Abdomen: no tenderness or distention, no masses by palpation, no abnormal pulsatility or arterial bruits, normal bowel sounds, no hepatosplenomegaly Extremities: no clubbing, cyanosis or edema; 2+ radial, ulnar and brachial pulses bilaterally; 2+ right femoral, posterior tibial and dorsalis pedis pulses; 2+ left femoral, posterior tibial and dorsalis pedis pulses; no subclavian or femoral bruits Neurological: grossly nonfocal   EKG: Sinus rhythm  BMET    Component Value Date/Time   NA 140 08/20/2013 1934   K 3.7 08/20/2013 1934   CL 99 08/20/2013 1934   CO2 29 08/20/2013 1934   GLUCOSE 105* 08/20/2013 1934   BUN 18 08/20/2013 1934   CREATININE 1.11* 08/20/2013 1934   CALCIUM 9.7 08/20/2013 1934   GFRNONAA 58* 08/20/2013 1934   GFRAA 67* 08/20/2013 1934     ASSESSMENT AND PLAN  No evidence of arrhythmia. Continue beta blocker for occasional ectopy. We'll try to see if a combination drug such as triamterene-hydrochlorothiazide provides good control of hypertension and edema, without causing fatigue and hypokalemia. Her fatigue could well be related to obstructive sleep apnea. Weight loss is again recommended.  Orders Placed This Encounter  Procedures  . Basic metabolic panel   Meds ordered this encounter  Medications  . triamterene-hydrochlorothiazide (DYAZIDE) 37.5-25 MG per capsule    Sig: Take 1/2 tablet daily    Dispense:  15 capsule    Refill:  [place] Summer Hannis, MD, [facility] [phone] office [phone] pager

## 2013-11-30 NOTE — Patient Instructions (Signed)
  We will see you back in follow up in 3 months with Dr Sallyanne Kuster  Dr Gwenlyn Found has ordered: 1. Your physician recommends that you return for lab work in: 1-2 weeks  2. Medication changes-Start: Triam/Hct 37.5/25mg  1/2 tablet daily                                    Stop: Potassium and Hydrocholothiazide

## 2013-12-04 ENCOUNTER — Other Ambulatory Visit: Payer: Self-pay | Admitting: Cardiovascular Disease

## 2013-12-04 NOTE — Telephone Encounter (Signed)
Rx was sent to pharmacy electronically. 

## 2013-12-09 ENCOUNTER — Telehealth: Payer: Self-pay | Admitting: Cardiovascular Disease

## 2013-12-09 NOTE — Telephone Encounter (Signed)
Pt. Informed [facility] order good anywhere she wants to get done

## 2013-12-09 NOTE — Telephone Encounter (Signed)
Pt would like an order for her Pot[place]um so she can get it at her primary doctor.Please fax it to [phone]

## 2013-12-17 ENCOUNTER — Telehealth: Payer: Self-pay | Admitting: Cardiovascular Disease

## 2013-12-17 NOTE — Telephone Encounter (Signed)
Pt wants her Potassium results.

## 2013-12-18 NOTE — Telephone Encounter (Signed)
9/4 labs reviewed - in normal range

## 2013-12-18 NOTE — Telephone Encounter (Signed)
SPOKE TO PATIENT . RESULTS GIVEN- BMP LEVEL IN NORMAL RANGE. PATIENT VERBALIZED UNDERSTANDING.

## 2013-12-25 ENCOUNTER — Telehealth: Payer: Self-pay | Admitting: Cardiovascular Disease

## 2013-12-25 MED ORDER — TRIAMTERENE-HCTZ 37.5-25 MG PO TABS: 0.5000 | ORAL_TABLET | Freq: Every day | ORAL | Status: DC

## 2013-12-25 NOTE — Telephone Encounter (Signed)
Spoke with patient. 90 day supply of medication sent to pharmacy. Informed patient to contact office if top number of BP gets below 100

## 2013-12-25 NOTE — Telephone Encounter (Signed)
Pt called in stating that Dr. Loletha Grayer wrote her 15 day prescription for Triamterene HTCZ and in order for her insurance to cover it, it must be a 90 day supply. She alos stated that she took her BP this morning and it was 112/52, she wanted to know should her blood pressure medicine be decreased. Please call back   Thanks

## 2013-12-30 ENCOUNTER — Ambulatory Visit: Payer: PRIVATE HEALTH INSURANCE | Admitting: Cardiovascular Disease

## 2014-01-26 ENCOUNTER — Telehealth: Payer: Self-pay | Admitting: Cardiovascular Disease

## 2014-01-26 DIAGNOSIS — Z79899 Other long term (current) drug therapy: Secondary | ICD-10-CM

## 2014-01-26 NOTE — Telephone Encounter (Signed)
Pt called in stating that she has been having an increase in heart palpitations for the past 2 wks and would like to know if she needs to adjust her meds in any way to manage until she is able to come in and see Dr. Loletha Grayer on 12/3. Please call  Thanks

## 2014-01-26 NOTE — Telephone Encounter (Signed)
Please check BMET

## 2014-01-26 NOTE — Telephone Encounter (Signed)
Increased episodes of rapid heart beat over the past 2 weeks (since starting Maxzide).  Notices when she is active - feels like she is running a race.  LE edema is the same as when she was taking HCTZ but always goes down when she gets off her feet and elevates them.  Would like to know if she needs to change any meds. Appointment is 03/11/14. Message sent to Dr. Loletha Grayer for review and advise.

## 2014-01-26 NOTE — Telephone Encounter (Signed)
Patient notified to get a BMP done.  Order faxed to patient and she will go to her PCP (in [place] today.

## 2014-01-28 ENCOUNTER — Telehealth: Payer: Self-pay | Admitting: Cardiovascular Disease

## 2014-01-28 NOTE — Telephone Encounter (Signed)
Lab results located. Informed patient that BUN/Creatinine, sodium & potassium all WNL

## 2014-01-28 NOTE — Telephone Encounter (Signed)
Pt called in wanting to know if Dr. Lurline Del nurse received the results from her blood work. Please call   Thanks

## 2014-02-04 ENCOUNTER — Encounter: Payer: Self-pay | Admitting: Obstetrics and Gynecology

## 2014-02-04 ENCOUNTER — Ambulatory Visit: Payer: PRIVATE HEALTH INSURANCE | Admitting: Obstetrics and Gynecology

## 2014-02-04 ENCOUNTER — Ambulatory Visit (INDEPENDENT_AMBULATORY_CARE_PROVIDER_SITE_OTHER): Payer: PRIVATE HEALTH INSURANCE | Admitting: Obstetrics and Gynecology

## 2014-02-04 VITALS — BP 120/72 | Ht 69.0 in | Wt 242.0 lb

## 2014-02-04 DIAGNOSIS — Z308 Encounter for other contraceptive management: Secondary | ICD-10-CM

## 2014-02-04 NOTE — Progress Notes (Signed)
[name], female   DOB: Aug 27, 1965, 48 y.o.   MRN: [id number] Pt here today to discuss a tubal. Pt states that she is currently sexually active and that she uses condoms, but the condoms cause her to have an infection every time that she uses them. Pt states that she has used every kind of condom you can think of and they are all the same. Pt wants to know if she should have a tubal or if there is another option out there to help her with this problem.

## 2014-02-04 NOTE — Progress Notes (Signed)
[name], female   DOB: 24-May-1965, 48 y.o.   MRN: [id number]   [facility] [facility] Visit  Patient name: Summer Carpenter MRN [id number]  Date of birth: February 16, 1966  CC & HPI:  Summer Carpenter is a 48 y.o. female presenting today to discuss tubal ligation.  She is currently sexually active and uses condoms as a form of contraception but states that she develops a vaginal infection after every use.  She is single and only has one sexual partner.  Her next menses will occur between the 15th and 18th of November.     ROS:  All systems have been reviewed and are negative unless otherwise specified in the HPI.   Pertinent History Reviewed:   Reviewed: Significant for  Medical         Past Medical History  Diagnosis Date  . Hypertension   . Anxiety   . Hyperlipidemia   . GERD (gastroesophageal reflux disease)   . Prediabetes   . Palpitations   . Obesity   . Sleep apnea   . Sleep apnea                               Surgical Hx:    Past Surgical History  Procedure Laterality Date  . Cholecystectomy    . Doppler echocardiography  07/30/2000 [place]    EF 55-66%,lv normal  . Nm myocar perf wall motion  10/22/2011  . Cardiac catheterization  11/25/2001    norm. LV fx,norm. abd aorta, both renal arteries patent  . Oophorectomy  2008    lt   Medications: Reviewed & Updated - see associated section                      Current outpatient prescriptions:aspirin EC 81 MG tablet, Take 81 mg by mouth daily.  , Disp: , Rfl: ;  ibuprofen (ADVIL,MOTRIN) 200 MG tablet, Take 200 mg by mouth every 6 (six) hours as needed (pain). , Disp: , Rfl: ;  omeprazole (PRILOSEC) 20 MG capsule, Take 20 mg by mouth daily., Disp: , Rfl: ;  propranolol ER (INDERAL LA) 120 MG 24 hr capsule, TAKE ONE CAPSULE BY MOUTH EVERY DAY, Disp: 90 capsule, Rfl: 3 sertraline (ZOLOFT) 50 MG tablet, Take 50 mg by mouth daily., Disp: , Rfl: ;  simvastatin (ZOCOR) 10 MG tablet, Take 10 mg by  mouth at bedtime. , Disp: , Rfl: ;  triamterene-hydrochlorothiazide (MAXZIDE-25) 37.5-25 MG per tablet, Take 0.5 tablets by mouth daily., Disp: 45 tablet, Rfl: 3;  valACYclovir (VALTREX) 500 MG tablet, Take 500 mg by mouth as needed (cold sores). , Disp: , Rfl:    Social History: Reviewed -  reports that she has never smoked. She has never used smokeless tobacco.  Objective Findings:  Vitals: Blood pressure 120/72, height 5\' 9"  (1.753 m), weight 242 lb (109.77 kg), last menstrual period 01/24/2014.  Physical Examination: Lengthy discussion of various contraceptive methods.   Assessment & Plan:   A:  1. Contraception management   P:  1. Follow-up in 3 wks for IUD insertion   This chart was scribed for Jonnie Kind, MD by Donato Schultz, ED Scribe. The patient's care was started at 9:06 AM.

## 2014-02-25 ENCOUNTER — Ambulatory Visit: Payer: PRIVATE HEALTH INSURANCE | Admitting: Cardiovascular Disease

## 2014-03-11 ENCOUNTER — Ambulatory Visit (INDEPENDENT_AMBULATORY_CARE_PROVIDER_SITE_OTHER): Payer: PRIVATE HEALTH INSURANCE | Admitting: Cardiovascular Disease

## 2014-03-11 ENCOUNTER — Encounter: Payer: Self-pay | Admitting: Cardiovascular Disease

## 2014-03-11 VITALS — BP 122/82 | HR 76 | Resp 16 | Ht 67.0 in | Wt 241.1 lb

## 2014-03-11 DIAGNOSIS — E669 Obesity, unspecified: Secondary | ICD-10-CM

## 2014-03-11 DIAGNOSIS — I10 Essential (primary) hypertension: Secondary | ICD-10-CM

## 2014-03-11 DIAGNOSIS — E785 Hyperlipidemia, unspecified: Secondary | ICD-10-CM

## 2014-03-11 DIAGNOSIS — G4733 Obstructive sleep apnea (adult) (pediatric): Secondary | ICD-10-CM

## 2014-03-11 MED ORDER — NEBIVOLOL HCL 10 MG PO TABS: 10.0000 mg | ORAL_TABLET | Freq: Every day | ORAL | Status: DC

## 2014-03-11 NOTE — Progress Notes (Signed)
[name], female   DOB: [birth year], 48 y.o.   MRN: [id number]     Reason for office visit Palpitations, hypertension  Summer Carpenter has had problems with palpitations that responded well to treatment with propranolol. She also takes hydrochlorothiazide which helps both with her blood pressure and ankle swelling. Her palpitations are satisfactorily controlled now, but she does described increased fatigue and lack of energy since beginning the beta blocker.  Echocardiogram performed in September 2014 shows normal left ventricular size wall thickness systolic and diastolic function in the absence of any meaningful valvular abnormalities. She has never complained of chest discomfort. She takes a statin for hyperlipidemia. Allergies  Allergen Reactions  . Septra [Bactrim] Hives and Diarrhea    Current Outpatient Prescriptions  Medication Sig Dispense Refill  . aspirin EC 81 MG tablet Take 81 mg by mouth daily.      Marland Kitchen KLOR-CON 8 MEQ tablet Take 8 mEq by mouth every other day.  6  . metroNIDAZOLE (METROGEL) 0.75 % vaginal gel Place 1 Applicatorful vaginally as needed.  1  . omeprazole (PRILOSEC) 20 MG capsule Take 20 mg by mouth daily.    . propranolol ER (INDERAL LA) 120 MG 24 hr capsule TAKE ONE CAPSULE BY MOUTH EVERY DAY 90 capsule 3  . sertraline (ZOLOFT) 50 MG tablet Take 50 mg by mouth daily.    . simvastatin (ZOCOR) 10 MG tablet Take 10 mg by mouth at bedtime.     . triamterene-hydrochlorothiazide (MAXZIDE-25) 37.5-25 MG per tablet Take 0.5 tablets by mouth daily. 45 tablet 3  . valACYclovir (VALTREX) 500 MG tablet Take 500 mg by mouth as needed (cold sores).      No current facility-administered medications for this visit.    Past Medical History  Diagnosis Date  . Hypertension   . Anxiety   . Hyperlipidemia   . GERD (gastroesophageal reflux disease)   . Prediabetes   . Palpitations   . Obesity   . Sleep apnea   . Sleep apnea     Past Surgical  History  Procedure Laterality Date  . Cholecystectomy    . Doppler echocardiography  07/30/2000 [place]    EF 55-66%,lv normal  . Nm myocar perf wall motion  10/22/2011  . Cardiac catheterization  11/25/2001    norm. LV fx,norm. abd aorta, both renal arteries patent  . Oophorectomy  2008    lt    Family History  Problem Relation Age of Onset  . Hypertension Mother   . Heart attack Mother   . Seizures Father   . Hypertension Brother   . Hypertension Brother   . Hypertension Sister   . Early death Brother   . Hypertension Son     History   Social History  . Marital Status: Single    Spouse Name: N/A    Number of Children: N/A  . Years of Education: N/A   Occupational History  . Not on file.   Social History Main Topics  . Smoking status: Never Smoker   . Smokeless tobacco: Never Used  . Alcohol Use: Yes     Comment: Occasionally, once a month  . Drug Use: No  . Sexual Activity: Yes    Birth Control/ Protection: Condom, None   Other Topics Concern  . Not on file   Social History Narrative    Review of systems: The patient specifically denies any chest pain at rest or with exertion, dyspnea at rest or with exertion, orthopnea, paroxysmal nocturnal  dyspnea, syncope, palpitations, focal neurological deficits, intermittent claudication, lower extremity edema, unexplained weight gain, cough, hemoptysis or wheezing.  The patient also denies abdominal pain, nausea, vomiting, dysphagia, diarrhea, constipation, polyuria, polydipsia, dysuria, hematuria, frequency, urgency, abnormal bleeding or bruising, fever, chills, unexpected weight changes, mood swings, change in skin or hair texture, change in voice quality, auditory or visual problems, allergic reactions or rashes, new musculoskeletal complaints other than usual "aches and pains".   PHYSICAL EXAM BP 122/82 mmHg  Pulse 76  Resp 16  Ht 5\' 7"  (1.702 m)  Wt 241 lb 1.6 oz (109.362 kg)  BMI 37.75 kg/m2  General:  Alert, oriented x3, no distress Head: no evidence of trauma, PERRL, EOMI, no exophtalmos or lid lag, no myxedema, no xanthelasma; normal ears, nose and oropharynx Neck: normal jugular venous pulsations and no hepatojugular reflux; brisk carotid pulses without delay and no carotid bruits Chest: clear to auscultation, no signs of consolidation by percussion or palpation, normal fremitus, symmetrical and full respiratory excursions Cardiovascular: normal position and quality of the apical impulse, regular rhythm, normal first and second heart sounds, no murmurs, rubs or gallops Abdomen: no tenderness or distention, no masses by palpation, no abnormal pulsatility or arterial bruits, normal bowel sounds, no hepatosplenomegaly Extremities: no clubbing, cyanosis or edema; 2+ radial, ulnar and brachial pulses bilaterally; 2+ right femoral, posterior tibial and dorsalis pedis pulses; 2+ left femoral, posterior tibial and dorsalis pedis pulses; no subclavian or femoral bruits Neurological: grossly nonfocal   EKG: NSR  Lipid Panel  No results found for: CHOL, TRIG, HDL, CHOLHDL, VLDL, LDLCALC, LDLDIRECT  BMET    Component Value Date/Time   NA 140 08/20/2013 1934   K 3.7 08/20/2013 1934   CL 99 08/20/2013 1934   CO2 29 08/20/2013 1934   GLUCOSE 105* 08/20/2013 1934   BUN 18 08/20/2013 1934   CREATININE 1.11* 08/20/2013 1934   CALCIUM 9.7 08/20/2013 1934   GFRNONAA 58* 08/20/2013 1934   GFRAA 67* 08/20/2013 1934     ASSESSMENT AND PLAN  Summer Carpenter has had satisfactory control of her palpitations with propranolol but does complain of fatigue. We'll try to see if she feels better on Bystolic.  Gave her enough samples to last for roughly 1 month and she will call us if she would like to continue with a prescription understanding that this brand-name medication will be more expensive than the propranolol. If she does not have significant improvement in fatigue after the change, it would not make  sense for her to take the more expensive drug.  Patient Instructions  STOP Propanolol.  START Bystolic 10mg  daily.  Dr. Sallyanne Kuster recommends that you schedule a follow-up appointment in: One year.      Meds ordered this encounter  Medications  . KLOR-CON 8 MEQ tablet    Sig: Take 8 mEq by mouth every other day.    Refill:  6  . metroNIDAZOLE (METROGEL) 0.75 % vaginal gel    Sig: Place 1 Applicatorful vaginally as needed.    Refill:  [place] Anni Hocevar, MD, [facility] [phone] office [phone] pager

## 2014-03-11 NOTE — Patient Instructions (Signed)
STOP Propanolol.  START Bystolic 10mg  daily.  Dr. Sallyanne Kuster recommends that you schedule a follow-up appointment in: One year.

## 2014-04-14 ENCOUNTER — Other Ambulatory Visit: Payer: Self-pay | Admitting: Cardiovascular Disease

## 2014-04-14 MED ORDER — NEBIVOLOL HCL 10 MG PO TABS: 10.0000 mg | ORAL_TABLET | Freq: Every day | ORAL | Status: DC

## 2014-04-14 NOTE — Telephone Encounter (Signed)
Rx(s) sent to pharmacy electronically.  

## 2014-04-14 NOTE — Telephone Encounter (Signed)
Pt need a new prescription for Bystolic.Dr C had let her try the samples and now she needs a prescription.Please call this to [facility].

## 2014-04-19 ENCOUNTER — Ambulatory Visit: Payer: PRIVATE HEALTH INSURANCE | Admitting: Cardiovascular Disease

## 2014-04-21 ENCOUNTER — Telehealth: Payer: Self-pay | Admitting: Cardiovascular Disease

## 2014-04-21 NOTE — Telephone Encounter (Signed)
Received call back from patient Dr.Croitoru said congratulations.Advised to start off slow and build up,stay well hydrated.

## 2014-04-21 NOTE — Telephone Encounter (Signed)
Tell her congratulations, I'm glad she is doing that. Make sure she starts gradually and builds up. No restrictions, stay well-hydrated

## 2014-04-21 NOTE — Telephone Encounter (Signed)
Returned call to patient she wanted to ask Dr.Croitoru if ok to exercise.Stated she joined a gym and she needs his ok.Message sent to Dr.Croitoru.

## 2014-04-21 NOTE — Telephone Encounter (Signed)
Returned call to patient no answer.LMTC. 

## 2014-04-21 NOTE — Telephone Encounter (Signed)
Pt joined a gym today,is it okay that she can do these exercise?

## 2014-05-06 ENCOUNTER — Telehealth: Payer: Self-pay | Admitting: Cardiovascular Disease

## 2014-05-06 NOTE — Telephone Encounter (Signed)
Returned call to patient no answer.Left message on personal voice mail Dr.Croitoru advised excellent B/P readings.Advised to continue same medications.

## 2014-05-06 NOTE — Telephone Encounter (Signed)
Returned call to patient she stated she wanted to let Dr.Croitoru know for the past 2 weeks systolic B/P 076 to 151.Dyastolic in 83'U.Message sent to Dr.Croitoru.

## 2014-05-06 NOTE — Telephone Encounter (Signed)
Excellent BP. Keep same meds

## 2014-05-06 NOTE — Telephone Encounter (Signed)
Please call,her blood pressure is high and pt is concerned.

## 2014-06-03 ENCOUNTER — Ambulatory Visit (INDEPENDENT_AMBULATORY_CARE_PROVIDER_SITE_OTHER): Payer: PRIVATE HEALTH INSURANCE | Admitting: Cardiovascular Disease

## 2014-06-03 ENCOUNTER — Encounter: Payer: Self-pay | Admitting: Cardiovascular Disease

## 2014-06-03 VITALS — BP 130/76 | HR 60 | Ht 67.0 in | Wt 241.0 lb

## 2014-06-03 DIAGNOSIS — E669 Obesity, unspecified: Secondary | ICD-10-CM

## 2014-06-03 DIAGNOSIS — I10 Essential (primary) hypertension: Secondary | ICD-10-CM

## 2014-06-03 DIAGNOSIS — R002 Palpitations: Secondary | ICD-10-CM

## 2014-06-03 DIAGNOSIS — E785 Hyperlipidemia, unspecified: Secondary | ICD-10-CM

## 2014-06-03 NOTE — Patient Instructions (Signed)
Your physician recommends that you schedule a follow-up appointment as needed for sleep with Dr. Claiborne Billings.

## 2014-06-05 ENCOUNTER — Encounter: Payer: Self-pay | Admitting: Cardiovascular Disease

## 2014-06-05 NOTE — Progress Notes (Signed)
[name], female   DOB: 1966-03-17, 49 y.o.   MRN: [id number]     HPI: Summer Carpenter is a 49 y.o. female presented for sleep clinic evaluation following initiation of CPAP therapy.  Summer Carpenter has a history of palpitations, hypertension, hyperlipidemia, who is followed by Dr. Sallyanne Kuster.  Due to concerns for sleep apnea.  She was referred for a sleep study is done in 2014.  This was notable for moderate sleep apnea with an AHI of 17.5 per hour but her sleep apnea was very severe during REM sleep at 40.6 per hour.  Her oxygen desaturation dropped 86% with REM sleep, and he was evidence for mild snoring.  She also had 245 periodic limb movements with an index of 46.2 per hour with 12.8 per hour to arousal.  She underwent a CPAP titration trial and was titrated up to 12 cm water pressure.  He, the patient was started on CPAP therapy, but she never came for follow-up evaluation.  She has an AIirSense10 AutoSet unit set with 5 cm minimum pressure meters and 20 cm maximum pressure.  A download was obtained under 24 2016 through 05/31/2014.  There are only 60% of days with device usage.  Only 43% of the days with greater than 4 hours use.  She was averaging 4 hours and 28 minutes.  Her AHI when using her CPAP was excellent at 1.5.  Upon further questioning, she goes to bed at 10:00 and often wakes up at 6:30.  She is not been using her CPAP for long enough duration.  She has been using a ResMed Airfit  PT 10 nasal pillow medium mask.  She is unaware of breakthrough snoring.  She denies bruxism.  She denies painful restless legs.  She denies excessive daytime sleepiness.   Epworth Sleepiness Scale: Situation   Chance of Dozing/Sleeping (0 = never , 1 = slight chance , 2 = moderate chance , 3 = high chance )   sitting and reading 0   watching TV 1   sitting inactive in a public place 0   being a passenger in a motor vehicle for an hour or more 1   lying down in the  afternoon 1   sitting and talking to someone 0   sitting quietly after lunch (no alcohol) 0   while stopped for a few minutes in traffic as the driver 0   Total Score  3    Past Medical History  Diagnosis Date  . Hypertension   . Anxiety   . Hyperlipidemia   . GERD (gastroesophageal reflux disease)   . Prediabetes   . Palpitations   . Obesity   . Sleep apnea   . Sleep apnea     Past Surgical History  Procedure Laterality Date  . Cholecystectomy    . Doppler echocardiography  07/30/2000 [place]    EF 55-66%,lv normal  . Nm myocar perf wall motion  10/22/2011  . Cardiac catheterization  11/25/2001    norm. LV fx,norm. abd aorta, both renal arteries patent  . Oophorectomy  2008    lt    Allergies  Allergen Reactions  . Septra [Bactrim] Hives and Diarrhea    Current Outpatient Prescriptions  Medication Sig Dispense Refill  . aspirin EC 81 MG tablet Take 81 mg by mouth daily.      . cyclobenzaprine (FLEXERIL) 5 MG tablet as needed.    . fluticasone (FLONASE) 50 MCG/ACT nasal spray Place 1 spray into both  nostrils daily.  1  . KLOR-CON 8 MEQ tablet Take 8 mEq by mouth every other day.  6  . metroNIDAZOLE (METROGEL) 0.75 % vaginal gel Place 1 Applicatorful vaginally as needed.  1  . nebivolol (BYSTOLIC) 10 MG tablet Take 1 tablet (10 mg total) by mouth daily. 30 tablet 11  . omeprazole (PRILOSEC) 20 MG capsule Take 20 mg by mouth daily.    . sertraline (ZOLOFT) 50 MG tablet Take 50 mg by mouth daily.    . simvastatin (ZOCOR) 10 MG tablet Take 10 mg by mouth at bedtime.     . triamterene-hydrochlorothiazide (MAXZIDE-25) 37.5-25 MG per tablet Take 0.5 tablets by mouth daily. 45 tablet 3  . valACYclovir (VALTREX) 500 MG tablet Take 500 mg by mouth as needed (cold sores).      No current facility-administered medications for this visit.    History   Social History  . Marital Status: Single    Spouse Name: N/A  . Number of Children: N/A  . Years of Education: N/A     Occupational History  . Not on file.   Social History Main Topics  . Smoking status: Never Smoker   . Smokeless tobacco: Never Used  . Alcohol Use: Yes     Comment: Occasionally, once a month  . Drug Use: No  . Sexual Activity: Yes    Birth Control/ Protection: Condom, None   Other Topics Concern  . Not on file   Social History Narrative    Family History  Problem Relation Age of Onset  . Hypertension Mother   . Heart attack Mother   . Seizures Father   . Hypertension Brother   . Hypertension Brother   . Hypertension Sister   . Early death Brother   . Hypertension Son      ROS General: Negative; No fevers, chills, or night sweats; positive for moderate obesity HEENT: Negative; No changes in vision or hearing, sinus congestion, difficulty swallowing Pulmonary: Negative; No cough, wheezing, shortness of breath, hemoptysis Cardiovascular: Negative; No chest pain, presyncope, syncope, palpatations GI: Negative; No nausea, vomiting, diarrhea, or abdominal pain GU: Negative; No dysuria, hematuria, or difficulty voiding Musculoskeletal: Negative; no myalgias, joint pain, or weakness Hematologic: Negative; no easy bruising, bleeding Endocrine: Negative; no heat/cold intolerance Neuro: Negative; no changes in balance, headaches Skin: Negative; No rashes or skin lesions Psychiatric: Negative; No behavioral problems, depression Sleep: see HPI    Physical Exam BP 130/76 mmHg  Pulse 60  Ht '5\' 7"'  (1.702 m)  Wt 241 lb (109.317 kg)  BMI 37.74 kg/m2   Filed Weights   06/03/14 0902  Weight: 241 lb (109.317 kg)   General: Alert, oriented, no distress.  Skin: normal turgor, no rashes HEENT: Normocephalic, atraumatic. Pupils round and reactive; sclera anicteric; extraocular muscles intact; Fundi no hemorrhages or exudates.  Disks flat. Nose without nasal septal hypertrophy Mouth/Parynx benign; Mallinpatti scale 3 Neck: No JVD, no carotid briuts Lungs: clear to  ausculatation and percussion; no wheezing or rales  Chest wall: No tenderness to palpation Heart: RRR, s1 s2 normal; 1/6 systolic murmur.  No diastolic murmur.  No rubs thrills or heaves.  Abdomen: Moderate central adiposity; soft, nontender; no hepatosplenomehaly, BS+; abdominal aorta nontender and not dilated by palpation. Back: No CVA tenderness Pulses 2+ Extremities: no clubbinbg cyanosis or edema, Homan's sign negative  Neurologic: grossly nonfocal; cranial nerves intact. Psychological: Normal affect and mood.   LABS:  BMP Latest Ref Rng 08/20/2013 06/21/2013 12/14/2012  Glucose 70 - 99  mg/dL 105(H) 115(H) 105(H)  BUN 6 - 23 mg/dL '18 12 11  ' Creatinine 0.50 - 1.10 mg/dL 1.11(H) 0.96 1.03  Sodium 137 - 147 mEq/L 140 144 140  Potassium 3.7 - 5.3 mEq/L 3.7 3.2(L) 3.5  Chloride 96 - 112 mEq/L 99 102 102  CO2 19 - 32 mEq/L '29 30 30  ' Calcium 8.4 - 10.5 mg/dL 9.7 9.6 10.0     Hepatic Function Latest Ref Rng 08/20/2013 06/21/2013 09/16/2011  Total Protein 6.0 - 8.3 g/dL 7.7 8.0 7.6  Albumin 3.5 - 5.2 g/dL 3.9 3.8 3.6  AST 0 - 37 U/L '17 24 16  ' ALT 0 - 35 U/L '12 22 14  ' Alk Phosphatase 39 - 117 U/L 80 87 84  Total Bilirubin 0.3 - 1.2 mg/dL 0.6 0.6 0.5     CBC Latest Ref Rng 08/20/2013 06/21/2013 12/14/2012  WBC 4.0 - 10.5 K/uL 11.9(H) 9.6 9.6  Hemoglobin 12.0 - 15.0 g/dL 10.8(L) 11.7(L) 12.1  Hematocrit 36.0 - 46.0 % 33.2(L) 35.3(L) 36.2  Platelets 150 - 400 K/uL 386 351 341     Lipid Panel  No results found for: CHOL, TRIG, HDL, CHOLHDL, VLDL, LDLCALC, LDLDIRECT   RADIOLOGY: No results found.    ASSESSMENT AND PLAN: Ms. Raiana Pharris is a 50 year old female with moderate obesity who is a documented history of cardiovascular comorbidities including hypertension, hyperlipidemia, as well as palpitations for which she's been treated with beta blocker therapy.  I discussed her diagnostic polysomnogram as well as her CPAP titration trials with her in detail.  Presently, she is not  having any excessive daytime sleepiness.  When she uses her CPAP therapy.  She is doing well on oh unit.  Her median pressure is a 0.9, 95th percentile pressure 12.3, and maximum obtained pressure 13.7.  Her AHI with therapies 1.5.  She's recently received new equipment.  I reviewed with her normal sleep architecture and the adverse effects of untreated sleep apnea.  We discussed with her the severity of her sleep apnea during REM sleep and the fact that the preponderance of REM sleep occurs in the second half of night.  We discussed the adverse consequences of untreated sleep apnea with reference to her cardiovascular comorbidities.  We discussed that she should be sleeping for 8 hours duration and ideally placing the mask on at bedtime and taking it off in the morning will provide her with optimal treatment, particularly during periods of REM sleep when her sleep apnea is severe.  She has periodic limb movements noted on both her diagnostic polysomnogram as well as her CPAP trial.  Currently she is asymptomatic with reference to painful restless for restless legs.  She is on beta blocker therapy for palpitations.  At this point, I do not feel it is necessary to initiate therapy for restless legs.  I answered all her questions.  I discussed maintenance issues.  I discussed importance of weight loss with moderate obesity and BMI of the 37.7 kg/m.  I will be available for a sleep perspective, if problems arise.     Troy Sine, MD, [facility]  06/05/2014 10:30 AM

## 2014-06-15 ENCOUNTER — Other Ambulatory Visit: Payer: Self-pay

## 2014-06-15 MED ORDER — NEBIVOLOL HCL 10 MG PO TABS: 10.0000 mg | ORAL_TABLET | Freq: Every day | ORAL | Status: DC

## 2014-06-15 NOTE — Telephone Encounter (Signed)
Rx(s) sent to pharmacy electronically.  

## 2014-06-17 ENCOUNTER — Other Ambulatory Visit: Payer: Self-pay | Admitting: Cardiovascular Disease

## 2014-06-17 NOTE — Telephone Encounter (Signed)
°  1. Which medications need to be refilled? Bystolic-new prescription,it needs to be for 90 days What pharmacy is medication to be sent to?[facility],[place]  3. Do they need a 30 day or 90 day supply? 90 and refills  4. Would they like a call back once the medication has been sent to the pharmacy? yes

## 2014-06-17 NOTE — Telephone Encounter (Signed)
Called patient. Notified her that 90-day supply was sent to pharmacy on Tuesday, 06/15/14.

## 2014-07-06 ENCOUNTER — Encounter: Payer: Self-pay | Admitting: Cardiovascular Disease

## 2014-07-14 ENCOUNTER — Encounter (HOSPITAL_COMMUNITY): Payer: Self-pay | Admitting: Emergency Medicine

## 2014-07-14 ENCOUNTER — Emergency Department (HOSPITAL_COMMUNITY)
Admission: EM | Admit: 2014-07-14 | Discharge: 2014-07-14 | Disposition: A | Discharge status: Home / Self Care | Payer: PRIVATE HEALTH INSURANCE | Attending: Emergency Medicine | Admitting: Emergency Medicine

## 2014-07-14 DIAGNOSIS — I10 Essential (primary) hypertension: Secondary | ICD-10-CM | POA: Diagnosis not present

## 2014-07-14 DIAGNOSIS — Z7982 Long term (current) use of aspirin: Secondary | ICD-10-CM | POA: Insufficient documentation

## 2014-07-14 DIAGNOSIS — R002 Palpitations: Secondary | ICD-10-CM | POA: Diagnosis present

## 2014-07-14 DIAGNOSIS — E785 Hyperlipidemia, unspecified: Secondary | ICD-10-CM | POA: Insufficient documentation

## 2014-07-14 DIAGNOSIS — K219 Gastro-esophageal reflux disease without esophagitis: Secondary | ICD-10-CM | POA: Diagnosis not present

## 2014-07-14 DIAGNOSIS — Z792 Long term (current) use of antibiotics: Secondary | ICD-10-CM | POA: Insufficient documentation

## 2014-07-14 DIAGNOSIS — Z7951 Long term (current) use of inhaled steroids: Secondary | ICD-10-CM | POA: Diagnosis not present

## 2014-07-14 DIAGNOSIS — Z8669 Personal history of other diseases of the nervous system and sense organs: Secondary | ICD-10-CM | POA: Insufficient documentation

## 2014-07-14 DIAGNOSIS — E669 Obesity, unspecified: Secondary | ICD-10-CM | POA: Insufficient documentation

## 2014-07-14 DIAGNOSIS — Z8659 Personal history of other mental and behavioral disorders: Secondary | ICD-10-CM | POA: Diagnosis not present

## 2014-07-14 DIAGNOSIS — Z79899 Other long term (current) drug therapy: Secondary | ICD-10-CM | POA: Diagnosis not present

## 2014-07-14 LAB — MAGNESIUM: MAGNESIUM: 1.9 mg/dL (ref 1.5–2.5)

## 2014-07-14 LAB — CBC WITH DIFFERENTIAL/PLATELET
BASOS ABS: 0 10*3/uL (ref 0.0–0.1)
BASOS PCT: 0 % (ref 0–1)
Eosinophils Absolute: 0.3 10*3/uL (ref 0.0–0.7)
Eosinophils Relative: 3 % (ref 0–5)
HCT: 33.2 % — ABNORMAL LOW (ref 36.0–46.0)
Hemoglobin: 10.6 g/dL — ABNORMAL LOW (ref 12.0–15.0)
LYMPHS PCT: 29 % (ref 12–46)
Lymphs Abs: 3.1 10*3/uL (ref 0.7–4.0)
MCH: 26 pg (ref 26.0–34.0)
MCHC: 31.9 g/dL (ref 30.0–36.0)
MCV: 81.6 fL (ref 78.0–100.0)
Monocytes Absolute: 0.7 10*3/uL (ref 0.1–1.0)
Monocytes Relative: 7 % (ref 3–12)
NEUTROS ABS: 6.7 10*3/uL (ref 1.7–7.7)
NEUTROS PCT: 61 % (ref 43–77)
PLATELETS: 339 10*3/uL (ref 150–400)
RBC: 4.07 MIL/uL (ref 3.87–5.11)
RDW: 16.2 % — AB (ref 11.5–15.5)
WBC: 10.9 10*3/uL — ABNORMAL HIGH (ref 4.0–10.5)

## 2014-07-14 LAB — BASIC METABOLIC PANEL
Anion gap: 8 (ref 5–15)
BUN: 13 mg/dL (ref 6–23)
CHLORIDE: 104 mmol/L (ref 96–112)
CO2: 28 mmol/L (ref 19–32)
Calcium: 9.2 mg/dL (ref 8.4–10.5)
Creatinine, Ser: 0.98 mg/dL (ref 0.50–1.10)
GFR calc Af Amer: 78 mL/min — ABNORMAL LOW (ref >90)
GFR calc non Af Amer: 67 mL/min — ABNORMAL LOW (ref >90)
GLUCOSE: 97 mg/dL (ref 70–99)
POTASSIUM: 3.5 mmol/L (ref 3.5–5.1)
Sodium: 140 mmol/L (ref 135–145)

## 2014-07-14 LAB — POC URINE PREG, ED: Preg Test, Ur: NEGATIVE

## 2014-07-14 LAB — TSH: TSH: 0.833 u[IU]/mL (ref 0.350–4.500)

## 2014-07-14 LAB — TROPONIN I

## 2014-07-14 MED ORDER — SODIUM CHLORIDE 0.9 % IV BOLUS (SEPSIS): 1000.0000 mL | Freq: Once | INTRAVENOUS | Status: DC

## 2014-07-14 NOTE — ED Provider Notes (Signed)
TIME SEEN: 5:55 PM  CHIEF COMPLAINT: Palpitations  HPI: Pt is a 49 y.o. female with history of hypertension, hyperlipidemia, palpitations who presents emergency department with one week of intermittent palpitations. States she feels like her heart is "fluttering" and that this would last for several seconds and then resolve. States she has never had her palpitations last for a week however. Patient has seen Dr. Sallyanne Kuster with cardiology for this and has an appointment with him on May 13. States she has worn a Holter monitor in the past and has never shown any arrhythmia. States she has taken propanolol in the past which has helped. States her doctor wanted to start her on by systolic but she has not started this medication. Denies any chest pain or shortness of breath with these episodes. No recent nausea, vomiting or diarrhea. No dizziness or diaphoresis. No bloody stool or melena. Denies history of thyroid dysfunction. No aggravating or relieving factors. Denies history of PE or DVT, exogenous estrogen use, tobacco use, recent prolonged immobilization such as hospitalization, long flight, surgery, fracture.  ROS: See HPI Constitutional: no fever  Eyes: no drainage  ENT: no runny nose   Cardiovascular:  no chest pain  Resp: no SOB  GI: no vomiting GU: no dysuria Integumentary: no rash  Allergy: no hives  Musculoskeletal: no leg swelling  Neurological: no slurred speech ROS otherwise negative  PAST MEDICAL HISTORY/PAST SURGICAL HISTORY:  Past Medical History  Diagnosis Date  . Hypertension   . Anxiety   . Hyperlipidemia   . GERD (gastroesophageal reflux disease)   . Prediabetes   . Palpitations   . Obesity   . Sleep apnea   . Sleep apnea     MEDICATIONS:  Prior to Admission medications   Medication Sig Start Date End Date Taking? Authorizing Provider  aspirin EC 81 MG tablet Take 81 mg by mouth daily.      Historical Provider, MD  cyclobenzaprine (FLEXERIL) 5 MG tablet as needed.  05/05/14   Historical Provider, MD  fluticasone (FLONASE) 50 MCG/ACT nasal spray Place 1 spray into both nostrils daily. 05/25/14   Historical Provider, MD  KLOR-CON 8 MEQ tablet Take 8 mEq by mouth every other day. 12/18/13   Historical Provider, MD  metroNIDAZOLE (METROGEL) 0.75 % vaginal gel Place 1 Applicatorful vaginally as needed. 01/31/14   Historical Provider, MD  nebivolol (BYSTOLIC) 10 MG tablet Take 1 tablet (10 mg total) by mouth daily. 06/15/14   Mihai Croitoru, MD  omeprazole (PRILOSEC) 20 MG capsule Take 20 mg by mouth daily.    Historical Provider, MD  sertraline (ZOLOFT) 50 MG tablet Take 50 mg by mouth daily.    Historical Provider, MD  simvastatin (ZOCOR) 10 MG tablet Take 10 mg by mouth at bedtime.     Historical Provider, MD  triamterene-hydrochlorothiazide (MAXZIDE-25) 37.5-25 MG per tablet Take 0.5 tablets by mouth daily. 12/25/13   Mihai Croitoru, MD  valACYclovir (VALTREX) 500 MG tablet Take 500 mg by mouth as needed (cold sores).     Historical Provider, MD    ALLERGIES:  Allergies  Allergen Reactions  . Septra [Bactrim] Hives and Diarrhea    SOCIAL HISTORY:  History  Substance Use Topics  . Smoking status: Never Smoker   . Smokeless tobacco: Never Used  . Alcohol Use: Yes     Comment: Occasionally, once a month    FAMILY HISTORY: Family History  Problem Relation Age of Onset  . Hypertension Mother   . Heart attack Mother   .  Seizures Father   . Hypertension Brother   . Hypertension Brother   . Hypertension Sister   . Early death Brother   . Hypertension Son     EXAM: BP 121/82 mmHg  Pulse 71  Temp(Src) 98.9 F (37.2 C) (Oral)  Resp 20  Ht 5\' 7"  (1.702 m)  Wt 240 lb (108.863 kg)  BMI 37.58 kg/m2  SpO2 100%  LMP 07/14/2014 (Exact Date) CONSTITUTIONAL: Alert and oriented and responds appropriately to questions. Well-appearing; well-nourished, nontoxic, appears comfortable HEAD: Normocephalic EYES: Conjunctivae clear, PERRL ENT: normal nose; no  rhinorrhea; moist mucous membranes; pharynx without lesions noted NECK: Supple, no meningismus, no LAD, no thyromegaly  CARD: RRR; S1 and S2 appreciated; no murmurs, no clicks, no rubs, no gallops RESP: Normal chest excursion without splinting or tachypnea; breath sounds clear and equal bilaterally; no wheezes, no rhonchi, no rales, no hypoxia or respiratory distress, speaking full sentences ABD/GI: Normal bowel sounds; non-distended; soft, non-tender, no rebound, no guarding BACK:  The back appears normal and is non-tender to palpation, there is no CVA tenderness EXT: Normal ROM in all joints; non-tender to palpation; no edema; normal capillary refill; no cyanosis, no calf tenderness or swelling    SKIN: Normal color for age and race; warm NEURO: Moves all extremities equally PSYCH: The patient's mood and manner are appropriate. Grooming and personal hygiene are appropriate.  MEDICAL DECISION MAKING: Patient here with palpitations that only last for less than 30 seconds and then resolve. EKG shows no new ischemic changes, interval changes or arrhythmia. We'll obtain labs including electrolytes, troponin, TSH. We'll give IV fluids. Anticipate if workup is unremarkable we will discharge her home with outpatient follow-up with her cardiologist.  ED PROGRESS: Patient has not had any further palpitations on the emergency department. Her labs are unremarkable including normal electrolytes, negative troponin and normal TSH. Pregnancy test negative. She now states that she has been very stressed recently and thinks this may be due to anxiety. She states she will discuss being on a "nerve pill" by her primary care physician this week. Discussed return precautions. She verbalized understanding and is comfortable with plan. She has an appointment with her cardiologist on May 13.      EKG Interpretation  Date/Time:  Wednesday July 14 2014 15:23:19 EDT Ventricular Rate:  80 PR Interval:  136 QRS  Duration: 104 QT Interval:  386 QTC Calculation: 445 R Axis:   38 Text Interpretation:  Normal sinus rhythm Incomplete right bundle branch block Borderline ECG No significant change since last tracing Confirmed by [place] Hudman,  DO, Tyrie Porzio [id number]) on 07/14/2014 5:52:57 PM        [place], DO 07/14/14 2000

## 2014-07-14 NOTE — ED Notes (Signed)
Patient placed on continuous cardiac monitoring, continuous pulse 0x monitoring, denies pain

## 2014-07-14 NOTE — ED Notes (Signed)
Patient verbalizes understanding of discharge instructions, home care and follow up care. Patient ambulatory out of department at this time. 

## 2014-07-14 NOTE — Discharge Instructions (Signed)

## 2014-07-14 NOTE — ED Notes (Signed)
Pt reports palpitations x 1 week, worse today.

## 2014-07-22 ENCOUNTER — Telehealth: Payer: Self-pay | Admitting: Cardiovascular Disease

## 2014-07-22 NOTE — Telephone Encounter (Signed)
BP 114/60 yesterday.  Today 114/57 w/ 80 HR.  Advised pt these BPs are perfectly normal. She is not having any issues or symptoms. She voiced understanding.

## 2014-07-22 NOTE — Telephone Encounter (Signed)
Mrs. Gene is calling because for the past two days her bp has been running low 114/57 and wants to know if she should decrease or skip a couple of days. Please call

## 2014-08-04 ENCOUNTER — Telehealth: Payer: Self-pay | Admitting: Cardiovascular Disease

## 2014-08-04 NOTE — Telephone Encounter (Signed)
Called left message to call back 

## 2014-08-04 NOTE — Telephone Encounter (Signed)
Pt says she have been to the ER and family doctor for the fluttering in her chest. It seems to be getting worse,she wants to know if there is something that can be called in for this.Her appointment is not until 08-20-14.

## 2014-08-04 NOTE — Telephone Encounter (Signed)
PATIENT  STATES PALP ARE MORE  FREQUENTLY. OCCURRING ON DAILY BASIS FOR AT LEAST A MONTH. SHE IS TAKING BYSTOLIC ONLY.PATIENT AWARE WILL DEFER TO DR YBFXOVAN AND CONTACT HER .

## 2014-08-05 MED ORDER — PROPRANOLOL HCL ER 120 MG PO CP24: 120.0000 mg | ORAL_CAPSULE | Freq: Every day | ORAL | Status: DC

## 2014-08-05 NOTE — Telephone Encounter (Signed)
Best to stop by systolic and restart propranolol LA 120 mg once daily. Will review other options at her office appointment.

## 2014-08-05 NOTE — Telephone Encounter (Signed)
Pt is calling in to see if there is an alternative medication she can take that will help with her palpitations. She states that the Bystolic isn't really helping. Please f/u  Thanks

## 2014-08-05 NOTE — Telephone Encounter (Signed)
Returned call to patient she stated palpitations worse.Stated she thinks worse since she started on Bystolic 10 mg daily.Stated she has appointment with Dr Sallyanne Kuster 08/20/14 but wants to ask Dr.Croitoru if he can change Bystolic to a different medication.Message sent to Dr.Croitoru for advice.

## 2014-08-05 NOTE — Telephone Encounter (Signed)
Returned call to patient Dr.Croitoru advised to stop Bystolic and restart propranolol LA 120 mg daily.Advised to keep appointment with him 08/20/14 at 11:45 am.

## 2014-08-07 ENCOUNTER — Encounter (HOSPITAL_COMMUNITY): Payer: Self-pay | Admitting: Emergency Medicine

## 2014-08-07 ENCOUNTER — Emergency Department (HOSPITAL_COMMUNITY)
Admission: EM | Admit: 2014-08-07 | Discharge: 2014-08-07 | Disposition: A | Discharge status: Home / Self Care | Payer: PRIVATE HEALTH INSURANCE | Attending: Emergency Medicine | Admitting: Emergency Medicine

## 2014-08-07 DIAGNOSIS — E785 Hyperlipidemia, unspecified: Secondary | ICD-10-CM | POA: Diagnosis not present

## 2014-08-07 DIAGNOSIS — K219 Gastro-esophageal reflux disease without esophagitis: Secondary | ICD-10-CM | POA: Insufficient documentation

## 2014-08-07 DIAGNOSIS — F419 Anxiety disorder, unspecified: Secondary | ICD-10-CM | POA: Diagnosis not present

## 2014-08-07 DIAGNOSIS — E669 Obesity, unspecified: Secondary | ICD-10-CM | POA: Insufficient documentation

## 2014-08-07 DIAGNOSIS — Z7982 Long term (current) use of aspirin: Secondary | ICD-10-CM | POA: Insufficient documentation

## 2014-08-07 DIAGNOSIS — Z9889 Other specified postprocedural states: Secondary | ICD-10-CM | POA: Insufficient documentation

## 2014-08-07 DIAGNOSIS — R002 Palpitations: Secondary | ICD-10-CM | POA: Diagnosis present

## 2014-08-07 DIAGNOSIS — I10 Essential (primary) hypertension: Secondary | ICD-10-CM | POA: Insufficient documentation

## 2014-08-07 DIAGNOSIS — Z79899 Other long term (current) drug therapy: Secondary | ICD-10-CM | POA: Insufficient documentation

## 2014-08-07 DIAGNOSIS — Z8669 Personal history of other diseases of the nervous system and sense organs: Secondary | ICD-10-CM | POA: Insufficient documentation

## 2014-08-07 DIAGNOSIS — E876 Hypokalemia: Secondary | ICD-10-CM | POA: Diagnosis not present

## 2014-08-07 LAB — BASIC METABOLIC PANEL
Anion gap: 8 (ref 5–15)
BUN: 12 mg/dL (ref 6–23)
CALCIUM: 9 mg/dL (ref 8.4–10.5)
CHLORIDE: 102 mmol/L (ref 96–112)
CO2: 29 mmol/L (ref 19–32)
CREATININE: 1.04 mg/dL (ref 0.50–1.10)
GFR calc Af Amer: 72 mL/min — ABNORMAL LOW (ref >90)
GFR calc non Af Amer: 62 mL/min — ABNORMAL LOW (ref >90)
GLUCOSE: 101 mg/dL — AB (ref 70–99)
Potassium: 3 mmol/L — ABNORMAL LOW (ref 3.5–5.1)
Sodium: 139 mmol/L (ref 135–145)

## 2014-08-07 LAB — CBC
HEMATOCRIT: 33.5 % — AB (ref 36.0–46.0)
HEMOGLOBIN: 10.6 g/dL — AB (ref 12.0–15.0)
MCH: 25.9 pg — ABNORMAL LOW (ref 26.0–34.0)
MCHC: 31.6 g/dL (ref 30.0–36.0)
MCV: 81.7 fL (ref 78.0–100.0)
Platelets: 369 10*3/uL (ref 150–400)
RBC: 4.1 MIL/uL (ref 3.87–5.11)
RDW: 16.7 % — ABNORMAL HIGH (ref 11.5–15.5)
WBC: 12.4 10*3/uL — ABNORMAL HIGH (ref 4.0–10.5)

## 2014-08-07 LAB — TROPONIN I

## 2014-08-07 MED ORDER — POTASSIUM CHLORIDE CRYS ER 20 MEQ PO TBCR
40.0000 meq | EXTENDED_RELEASE_TABLET | Freq: Once | ORAL | Status: AC
  Administered 2014-08-07: 40 meq via ORAL
  Filled 2014-08-07: qty 2

## 2014-08-07 NOTE — Discharge Instructions (Signed)
Your potassium was low. This can cause palpitations. You must take your potassium medication daily. Follow-up with cardiologist.

## 2014-08-07 NOTE — ED Notes (Signed)
Patient complaining of palpitations. Patient reports this has been going on for several weeks, but started up again and worsened throughout today. States was evaluated recently here for same on 04/06, but has been unable to see cardiologist. Denies chest pain or shortness of breath. States she felt as if she was going to pass out earlier.

## 2014-08-07 NOTE — ED Notes (Signed)
Pt alert & oriented x4, stable gait. Patient given discharge instructions, paperwork & prescription(s). Patient  instructed to stop at the registration desk to finish any additional paperwork. Patient verbalized understanding. Pt left department w/ no further questions. 

## 2014-08-07 NOTE — ED Provider Notes (Signed)
CSN: [id number]     Arrival date & time 08/07/14  1908 History   First MD Initiated Contact with Patient 08/07/14 1928     Chief Complaint  Patient presents with  . Palpitations     (Consider location/radiation/quality/duration/timing/severity/associated sxs/prior Treatment) HPI... Presents with a sense of palpitations occurring several times a day for approximately 1 minute at a time. Patient's been evaluated by a cardiologist for this. Medications have recently been changed from [place] to Propranolol. She has not been taking her potassium. No chest pain, dyspnea, diaphoresis, nausea. Severity is mild.  Past Medical History  Diagnosis Date  . Hypertension   . Anxiety   . Hyperlipidemia   . GERD (gastroesophageal reflux disease)   . Prediabetes   . Palpitations   . Obesity   . Sleep apnea   . Sleep apnea    Past Surgical History  Procedure Laterality Date  . Cholecystectomy    . Doppler echocardiography  07/30/2000 [place]    EF 55-66%,lv normal  . Nm myocar perf wall motion  10/22/2011  . Cardiac catheterization  11/25/2001    norm. LV fx,norm. abd aorta, both renal arteries patent  . Oophorectomy  2008    lt   Family History  Problem Relation Age of Onset  . Hypertension Mother   . Heart attack Mother   . Seizures Father   . Hypertension Brother   . Hypertension Brother   . Hypertension Sister   . Early death Brother   . Hypertension Son    History  Substance Use Topics  . Smoking status: Never Smoker   . Smokeless tobacco: Never Used  . Alcohol Use: Yes     Comment: Occasionally, once a month   OB History    Gravida Para Term Preterm AB TAB SAB Ectopic Multiple Living   6 3 3  3  3         Review of Systems  All other systems reviewed and are negative.     Allergies  Septra  Home Medications   Prior to Admission medications   Medication Sig Start Date End Date Taking? Authorizing Provider  aspirin EC 81 MG tablet Take 81 mg by mouth  daily.     Yes Historical Provider, MD  omeprazole (PRILOSEC) 20 MG capsule Take 20 mg by mouth daily.   Yes Historical Provider, MD  propranolol ER (INDERAL LA) 120 MG 24 hr capsule Take 1 capsule (120 mg total) by mouth daily. 08/05/14  Yes Mihai Croitoru, MD  sertraline (ZOLOFT) 50 MG tablet Take 50 mg by mouth daily.   Yes Historical Provider, MD  simvastatin (ZOCOR) 10 MG tablet Take 10 mg by mouth at bedtime.    Yes Historical Provider, MD  triamterene-hydrochlorothiazide (MAXZIDE-25) 37.5-25 MG per tablet Take 0.5 tablets by mouth daily. 12/25/13  Yes Mihai Croitoru, MD  valACYclovir (VALTREX) 500 MG tablet Take 500 mg by mouth daily as needed (cold sores).    Yes Historical Provider, MD   BP 111/75 mmHg  Pulse 63  Temp(Src) 98.2 F (36.8 C) (Oral)  Resp 22  Ht 5\' 7"  (1.702 m)  Wt 236 lb (107.049 kg)  BMI 36.95 kg/m2  SpO2 99%  LMP 08/06/2014 Physical Exam  Constitutional: She is oriented to person, place, and time. She appears well-developed and well-nourished.  HENT:  Head: Normocephalic and atraumatic.  Eyes: Conjunctivae and EOM are normal. Pupils are equal, round, and reactive to light.  Neck: Normal range of motion. Neck supple.  Cardiovascular: Normal rate  and regular rhythm.   Pulmonary/Chest: Effort normal and breath sounds normal.  Abdominal: Soft. Bowel sounds are normal.  Musculoskeletal: Normal range of motion.  Neurological: She is alert and oriented to person, place, and time.  Skin: Skin is warm and dry.  Psychiatric: She has a normal mood and affect. Her behavior is normal.  Nursing note and vitals reviewed.   ED Course  Procedures (including critical care time) Labs Review Labs Reviewed  CBC - Abnormal; Notable for the following:    WBC 12.4 (*)    Hemoglobin 10.6 (*)    HCT 33.5 (*)    MCH 25.9 (*)    RDW 16.7 (*)    All other components within normal limits  BASIC METABOLIC PANEL - Abnormal; Notable for the following:    Potassium 3.0 (*)     Glucose, Bld 101 (*)    GFR calc non Af Amer 62 (*)    GFR calc Af Amer 72 (*)    All other components within normal limits  TROPONIN I  PREGNANCY, URINE    Imaging Review No results found.   EKG Interpretation   Date/Time:  Saturday August 07 2014 19:25:25 EDT Ventricular Rate:  80 PR Interval:  139 QRS Duration: 109 QT Interval:  414 QTC Calculation: 478 R Axis:   27 Text Interpretation:  Sinus rhythm Confirmed by Aboubacar Matsuo  MD, Sameka Bagent ([id number]) on  08/07/2014 8:18:26 PM      MDM   Final diagnoses:  Palpitations  Hypokalemia    Patient is in normal sinus rhythm. Monitor observed for several minutes. No ectopy noted. Potassium noted to be slightly low. Patient has not been taking her potassium. I discussed this with her and suggested this may be causing her palpitations. She has cardiology follow-up.    Nat Christen, MD 08/07/14 2249

## 2014-08-09 ENCOUNTER — Telehealth: Payer: Self-pay | Admitting: Cardiovascular Disease

## 2014-08-09 DIAGNOSIS — E876 Hypokalemia: Secondary | ICD-10-CM

## 2014-08-09 NOTE — Telephone Encounter (Signed)
Returned call to patient Dr.Croitoru advised palpitations may persist until potassium back to normal.Advised to have a bmet 08/18/14 at [facility].Advised to keep appointment with Dr.Croitoru 08/20/14 at 11:45 am.

## 2014-08-09 NOTE — Telephone Encounter (Signed)
The Propanolol is not helping her at all. Pt said she had to go back to ER this weekend.

## 2014-08-09 NOTE — Telephone Encounter (Signed)
Returned call to patient she stated she had to go to ER over the weekend for palpitations.Stated she felt faint, but never blacked out.Stated she was told her potassium was low and they gave her 2 potassium tablets.Stated she takes Klor Con 8 meq daily and Inderal LA 120 mg daily.Stated Inderal not helping palpitations.Message sent to Dr.Croitoru for advice.Patient has appointment with Dr.Croitoru 08/20/14.

## 2014-08-09 NOTE — Telephone Encounter (Signed)
Palpitations may persist until potassium back to normal. Please have BMET 1-2 days before appointment

## 2014-08-12 ENCOUNTER — Telehealth: Payer: Self-pay | Admitting: Cardiovascular Disease

## 2014-08-12 NOTE — Telephone Encounter (Signed)
Note sent to Dr. Sallyanne Kuster

## 2014-08-12 NOTE — Telephone Encounter (Signed)
Summer Carpenter is calling because her palpitations are lasting just about all day now . They started off being periodically , but now they are happening back to back . Please call   Thanks

## 2014-08-19 LAB — BASIC METABOLIC PANEL
BUN: 11 mg/dL (ref 6–23)
CHLORIDE: 103 meq/L (ref 96–112)
CO2: 29 mEq/L (ref 19–32)
CREATININE: 0.99 mg/dL (ref 0.50–1.10)
Calcium: 9.5 mg/dL (ref 8.4–10.5)
Glucose, Bld: 110 mg/dL — ABNORMAL HIGH (ref 70–99)
Potassium: 4.3 mEq/L (ref 3.5–5.3)
SODIUM: 139 meq/L (ref 135–145)

## 2014-08-20 ENCOUNTER — Encounter: Payer: Self-pay | Admitting: Cardiovascular Disease

## 2014-08-20 ENCOUNTER — Ambulatory Visit (INDEPENDENT_AMBULATORY_CARE_PROVIDER_SITE_OTHER): Payer: PRIVATE HEALTH INSURANCE | Admitting: Cardiovascular Disease

## 2014-08-20 VITALS — BP 136/84 | HR 72 | Ht 67.0 in | Wt 234.8 lb

## 2014-08-20 DIAGNOSIS — E669 Obesity, unspecified: Secondary | ICD-10-CM

## 2014-08-20 DIAGNOSIS — R002 Palpitations: Secondary | ICD-10-CM | POA: Diagnosis not present

## 2014-08-20 DIAGNOSIS — I10 Essential (primary) hypertension: Secondary | ICD-10-CM

## 2014-08-20 MED ORDER — PROPRANOLOL HCL ER 120 MG PO CP24: 240.0000 mg | ORAL_CAPSULE | Freq: Every day | ORAL | Status: DC

## 2014-08-20 NOTE — Progress Notes (Signed)
[name], female   DOB: 1965/10/31, 49 y.o.   MRN: [id number]      Cardiology Office Note   Date:  08/20/2014   ID:  Summer Carpenter, DOB 1965-10-10, MRN [id number]  PCP:  Geroge Baseman  Cardiologist:   Sanda Klein, MD   Chief Complaint  Patient presents with  . Follow-up    ER on Monday for chest pain and palpitations - found to be hypokalemic.  Since still having palpitations.      History of Present Illness: Summer Carpenter is a 49 y.o. female who presents for follow-up for palpitations. She has systemic hypertension. Previous monitoring has not shown any evidence of serious arrhythmia. She has normal left ventricular size, wall thickness, systolic and diastolic function and no valvular abnormalities by echocardiography performed in September 2014. She is compliant with a statin for hyperlipidemia and CPAP for obstructive sleep apnea.  She has had increasing frequency of palpitations over the last several weeks. Partly these seem to be related to hypokalemia but palpitations persist even after her potassium level has normalized with potassium chloride supplementation. Even though triamterene was added to her hydrochlorothiazide, the potassium remained low and she required additional oral potassium. She is also under increased "stress". Recent thyroid function tests are normal.  At one point she was switched from propranolol to nebivolol due to complaints of fatigue, but the latter agent was not as effective at suppressing palpitations.    Past Medical History  Diagnosis Date  . Hypertension   . Anxiety   . Hyperlipidemia   . GERD (gastroesophageal reflux disease)   . Prediabetes   . Palpitations   . Obesity   . Sleep apnea   . Sleep apnea     Past Surgical History  Procedure Laterality Date  . Cholecystectomy    . Doppler echocardiography  07/30/2000 [place]    EF 55-66%,lv normal  . Nm myocar perf wall motion  10/22/2011   . Cardiac catheterization  11/25/2001    norm. LV fx,norm. abd aorta, both renal arteries patent  . Oophorectomy  2008    lt     Current Outpatient Prescriptions  Medication Sig Dispense Refill  . aspirin EC 81 MG tablet Take 81 mg by mouth daily.      Marland Kitchen omeprazole (PRILOSEC) 20 MG capsule Take 20 mg by mouth daily.    . potassium chloride (KLOR-CON) 8 MEQ tablet Take 1 tablet (8 mEq total) by mouth daily. 30 tablet 6  . propranolol ER (INDERAL LA) 120 MG 24 hr capsule Take 2 capsules (240 mg total) by mouth daily. 60 capsule 6  . sertraline (ZOLOFT) 50 MG tablet Take 50 mg by mouth daily.    . simvastatin (ZOCOR) 10 MG tablet Take 10 mg by mouth at bedtime.     . valACYclovir (VALTREX) 500 MG tablet Take 500 mg by mouth daily as needed (cold sores).      No current facility-administered medications for this visit.    Allergies:   Septra    Social History:  The patient  reports that she has never smoked. She has never used smokeless tobacco. She reports that she drinks alcohol. She reports that she does not use illicit drugs.   Family History:  The patient's family history includes Early death in her brother; Heart attack in her mother; Hypertension in her brother, brother, mother, sister, and son; Seizures in her father.    ROS:  Please see the history of present illness.  Otherwise, review of systems positive for none.   All other systems are reviewed and negative.    PHYSICAL EXAM: VS:  BP 136/84 mmHg  Pulse 72  Ht 5\' 7"  (1.702 m)  Wt 234 lb 12.8 oz (106.505 kg)  BMI 36.77 kg/m2  LMP 08/06/2014 , BMI Body mass index is 36.77 kg/(m^2).  General: Alert, oriented x3, no distress Head: no evidence of trauma, PERRL, EOMI, no exophtalmos or lid lag, no myxedema, no xanthelasma; normal ears, nose and oropharynx Neck: normal jugular venous pulsations and no hepatojugular reflux; brisk carotid pulses without delay and no carotid bruits Chest: clear to auscultation, no signs  of consolidation by percussion or palpation, normal fremitus, symmetrical and full respiratory excursions Cardiovascular: normal position and quality of the apical impulse, regular rhythm, normal first and second heart sounds, no murmurs, rubs or gallops Abdomen: no tenderness or distention, no masses by palpation, no abnormal pulsatility or arterial bruits, normal bowel sounds, no hepatosplenomegaly Extremities: no clubbing, cyanosis or edema; 2+ radial, ulnar and brachial pulses bilaterally; 2+ right femoral, posterior tibial and dorsalis pedis pulses; 2+ left femoral, posterior tibial and dorsalis pedis pulses; no subclavian or femoral bruits Neurological: grossly nonfocal Psych: euthymic mood, full affect   EKG:  EKG is not ordered today. Electrocardiogram performed on April 6, April 30, May 2 oh show normal sinus rhythm  Recent Labs: 08/20/2013: ALT 12 07/14/2014: Magnesium 1.9; TSH 0.833 08/07/2014: Hemoglobin 10.6*; Platelets 369 08/18/2014: BUN 11; Creatinine 0.99; Potassium 4.3; Sodium 139    Lipid Panel No results found for: CHOL, TRIG, HDL, CHOLHDL, VLDL, LDLCALC, LDLDIRECT    Wt Readings from Last 3 Encounters:  08/20/14 234 lb 12.8 oz (106.505 kg)  08/07/14 236 lb (107.049 kg)  07/14/14 240 lb (108.863 kg)      Other studies Reviewed: Additional studies/ records that were reviewed today include: ED visits from April 6 and April 30.    ASSESSMENT AND PLAN:  We'll stop her diuretic which seems to be the cause of her hypokalemia. Also stop potassium supplements. Will double the dose of propranolol 220 mg twice daily. Return to office to follow-up for palliation of symptoms and blood pressure control. If she develops ankle edema, may do better with occasional dosing of furosemide rather than a daily thiazide diuretic.   Current medicines are reviewed at length with the patient today.  The patient does not have concerns regarding medicines.  The following changes have been  made:  Stop triamterene/hydrochlorothiazide and increase propranolol to 120 mg twice daily  Labs/ tests ordered today include:  No orders of the defined types were placed in this encounter.    Patient Instructions  INCREASE Propranolol to 120mg  twice a day.  STOP Triamterene/HCTZ.  Dr. Sallyanne Kuster recommends that you schedule a follow-up appointment in: 3 months.      Mikael Spray, MD  08/20/2014 1:34 PM    Sanda Klein, MD, [facility] [phone] office [phone] pager

## 2014-08-20 NOTE — Patient Instructions (Signed)
INCREASE Propranolol to 120mg  twice a day.  STOP Triamterene/HCTZ.  Dr. Sallyanne Kuster recommends that you schedule a follow-up appointment in: 3 months.

## 2014-08-24 ENCOUNTER — Telehealth: Payer: Self-pay | Admitting: Cardiovascular Disease

## 2014-08-24 NOTE — Telephone Encounter (Signed)
LM for patient with Dr. Lurline Del advice

## 2014-08-24 NOTE — Telephone Encounter (Signed)
Pt called in stating that for the past couple of days her BP has been running low (like 94/65 and 93/60) and she would like to know is this something she should be worried about. Please call  Thanks

## 2014-08-24 NOTE — Telephone Encounter (Signed)
Spoke with patient. She saw Dr. Loletha Grayer last week and medications were adjusted. She states she has had the BP readings noted below. Her HR is running 75-80bpm. Patient feels fine - has no symptoms - no lightheadedness/dizziness/prescynope.   Will inform Dr. Loletha Grayer as Summer Carpenter since medications were adjusted 08/20/14 She was advised to call office if BP drops lower or she becomes symptomatic from lower BP

## 2014-08-24 NOTE — Telephone Encounter (Signed)
If she feels well (not dizzy/lightheaded, weak, etc) no need to worry. As the triamterene/HCTZ wears off, her BP will gradually come up (takes up to 2 weeks)

## 2014-08-30 ENCOUNTER — Telehealth: Payer: Self-pay | Admitting: Cardiovascular Disease

## 2014-08-30 DIAGNOSIS — Z79899 Other long term (current) drug therapy: Secondary | ICD-10-CM

## 2014-08-30 MED ORDER — FUROSEMIDE 20 MG PO TABS: 20.0000 mg | ORAL_TABLET | Freq: Every day | ORAL | Status: DC | PRN

## 2014-08-30 MED ORDER — POTASSIUM CHLORIDE ER 10 MEQ PO TBCR: 10.0000 meq | EXTENDED_RELEASE_TABLET | Freq: Every day | ORAL | Status: DC | PRN

## 2014-08-30 NOTE — Telephone Encounter (Signed)
Pt c/o swelling: STAT is pt has developed SOB within 24 hours  1. How long have you been experiencing swelling? The day after her last ov 5/14.   2. Where is the swelling located? Ankles  3.  Are you currently taking a "fluid pill"? Dr. Loletha Grayer has taken her off of the fluid pill   4.  Are you currently SOB? No   5.  Have you traveled recently?no   *PT STATES THAT THEY START TO SWELL WHEN SHE IS MOVING AROUND BUT WHEN SHE IS SEATED , SHE DOES NOT HAVE THAT PROBLEM. SHE IS REQUESTING THAT DR.C CALL IN SOME MEDICATION FOR HER*

## 2014-08-30 NOTE — Telephone Encounter (Signed)
Orders submitted for BMET and new meds. Instructions given to patient. She verbalized understanding.

## 2014-08-30 NOTE — Telephone Encounter (Signed)
Reinforce sodium restriction please. Furosemide 20 mg daily PRN only (for swelling). On days when she takes the furosemide, also take KCl 10 mEq. Check BMET in 4-6 weeks.

## 2014-08-30 NOTE — Telephone Encounter (Signed)
Pt having pedal swelling, generally resolves w/ rest & elevation. She was taken off of her HCTZ recently d/t hypokalemia - told to call if fluid issues, Dr. Sallyanne Kuster would call in something different for her.  Denies dyspnea.  Will route for Dr. Victorino December recommendation.

## 2014-09-09 ENCOUNTER — Telehealth: Payer: Self-pay | Admitting: Cardiovascular Disease

## 2014-09-09 NOTE — Telephone Encounter (Signed)
Returned call to patient no answer.LMTC. 

## 2014-09-09 NOTE — Telephone Encounter (Signed)
Summer Carpenter is calling about he blood pressure. Have been taking her bp a couple times a day and this morning it was 77/66 and then on her break she checked again and it is 153/87.Marland Kitchen Would like to speak to someone. Please call    Thanks

## 2014-09-20 ENCOUNTER — Other Ambulatory Visit: Payer: Self-pay | Admitting: Obstetrics and Gynecology

## 2014-09-20 DIAGNOSIS — Z1231 Encounter for screening mammogram for malignant neoplasm of breast: Secondary | ICD-10-CM

## 2014-09-27 ENCOUNTER — Telehealth: Payer: Self-pay | Admitting: Cardiovascular Disease

## 2014-09-27 NOTE — Telephone Encounter (Signed)
Please call,blood pressure have been up for the past 2 weeks. The highest it was  185/93,she doesn't think the medicine is helping.

## 2014-09-27 NOTE — Telephone Encounter (Signed)
Pt. Called LMTCB

## 2014-09-27 NOTE — Telephone Encounter (Signed)
Pt. Called back and stated her BP has been running high over past two weeks highest has been 185/93, today it was 161/79. Pt. Stated she was going to see her PCP today and will let  us know if she wants to be seen here sooner than August

## 2014-10-05 ENCOUNTER — Other Ambulatory Visit (HOSPITAL_COMMUNITY)
Admission: RE | Admit: 2014-10-05 | Discharge: 2014-10-05 | Disposition: A | Discharge status: Home / Self Care | Payer: PRIVATE HEALTH INSURANCE | Source: Ambulatory Visit | Attending: Obstetrics & Gynecology | Admitting: Obstetrics & Gynecology

## 2014-10-05 ENCOUNTER — Ambulatory Visit (INDEPENDENT_AMBULATORY_CARE_PROVIDER_SITE_OTHER): Payer: PRIVATE HEALTH INSURANCE | Admitting: Obstetrics & Gynecology

## 2014-10-05 ENCOUNTER — Encounter: Payer: Self-pay | Admitting: Obstetrics & Gynecology

## 2014-10-05 VITALS — BP 120/80 | HR 80 | Ht 67.0 in | Wt 236.0 lb

## 2014-10-05 DIAGNOSIS — Z113 Encounter for screening for infections with a predominantly sexual mode of transmission: Secondary | ICD-10-CM | POA: Diagnosis present

## 2014-10-05 DIAGNOSIS — N920 Excessive and frequent menstruation with regular cycle: Secondary | ICD-10-CM

## 2014-10-05 DIAGNOSIS — Z01419 Encounter for gynecological examination (general) (routine) without abnormal findings: Secondary | ICD-10-CM | POA: Diagnosis not present

## 2014-10-05 DIAGNOSIS — N946 Dysmenorrhea, unspecified: Secondary | ICD-10-CM

## 2014-10-05 DIAGNOSIS — Z7251 High risk heterosexual behavior: Secondary | ICD-10-CM

## 2014-10-05 MED ORDER — MEGESTROL ACETATE 40 MG PO TABS: 40.0000 mg | ORAL_TABLET | Freq: Every day | ORAL | Status: DC

## 2014-10-05 NOTE — Addendum Note (Signed)
Addended by: Doyne Keel on: 10/05/2014 10:49 AM   Modules accepted: Orders

## 2014-10-05 NOTE — Progress Notes (Signed)
[name], female   DOB: 1965-04-23, 49 y.o.   MRN: [id number] Subjective:     Summer Carpenter is a 49 y.o. female here for a routine exam.  Patient's last menstrual period was 09/28/2014. [id number] Birth Control Method:  Condoms Menstrual Calendar(currently): regular  Current complaints: heavy painful periods.   Current acute medical issues:  hypertension   Recent Gynecologic History Patient's last menstrual period was 09/28/2014. Last Pap: 09/08/2013,  normal Last mammogram: 11/2013,  normal  Past Medical History  Diagnosis Date  . Hypertension   . Anxiety   . Hyperlipidemia   . GERD (gastroesophageal reflux disease)   . Prediabetes   . Palpitations   . Obesity   . Sleep apnea   . Sleep apnea     Past Surgical History  Procedure Laterality Date  . Cholecystectomy    . Doppler echocardiography  07/30/2000 [place]    EF 55-66%,lv normal  . Nm myocar perf wall motion  10/22/2011  . Cardiac catheterization  11/25/2001    norm. LV fx,norm. abd aorta, both renal arteries patent  . Oophorectomy  2008    lt    OB History    Gravida Para Term Preterm AB TAB SAB Ectopic Multiple Living   6 3 3  3  3          History   Social History  . Marital Status: Single    Spouse Name: N/A  . Number of Children: N/A  . Years of Education: N/A   Social History Main Topics  . Smoking status: Never Smoker   . Smokeless tobacco: Never Used  . Alcohol Use: Yes     Comment: Occasionally, once a month  . Drug Use: No  . Sexual Activity: Yes    Birth Control/ Protection: Condom, None   Other Topics Concern  . None   Social History Narrative    Family History  Problem Relation Age of Onset  . Hypertension Mother   . Heart attack Mother   . Seizures Father   . Hypertension Brother   . Hypertension Brother   . Hypertension Sister   . Early death Brother   . Hypertension Son      Current outpatient prescriptions:  .  aspirin EC 81 MG  tablet, Take 81 mg by mouth daily.  , Disp: , Rfl:  .  hydrochlorothiazide (HYDRODIURIL) 25 MG tablet, Take 25 mg by mouth daily., Disp: , Rfl:  .  omeprazole (PRILOSEC) 20 MG capsule, Take 20 mg by mouth daily., Disp: , Rfl:  .  potassium chloride (K-DUR) 10 MEQ tablet, Take 1 tablet (10 mEq total) by mouth daily as needed. Take 1 daily as needed with furosemide (Lasix), Disp: 90 tablet, Rfl: 1 .  propranolol ER (INDERAL LA) 120 MG 24 hr capsule, Take 2 capsules (240 mg total) by mouth daily., Disp: 60 capsule, Rfl: 6 .  sertraline (ZOLOFT) 50 MG tablet, Take 50 mg by mouth daily., Disp: , Rfl:  .  simvastatin (ZOCOR) 10 MG tablet, Take 10 mg by mouth at bedtime. , Disp: , Rfl:  .  valACYclovir (VALTREX) 500 MG tablet, Take 500 mg by mouth daily as needed (cold sores). , Disp: , Rfl:  .  furosemide (LASIX) 20 MG tablet, Take 1 tablet (20 mg total) by mouth daily as needed for edema (take 1 daily as needed for swelling. Please take Potassium pill with this.). (Patient not taking: Reported on 10/05/2014), Disp: 90 tablet, Rfl: 1  Review  of Systems  Review of Systems  Constitutional: Negative for fever, chills, weight loss, malaise/fatigue and diaphoresis.  HENT: Negative for hearing loss, ear pain, nosebleeds, congestion, sore throat, neck pain, tinnitus and ear discharge.   Eyes: Negative for blurred vision, double vision, photophobia, pain, discharge and redness.  Respiratory: Negative for cough, hemoptysis, sputum production, shortness of breath, wheezing and stridor.   Cardiovascular: Negative for chest pain, palpitations, orthopnea, claudication, leg swelling and PND.  Gastrointestinal: negative for abdominal pain. Negative for heartburn, nausea, vomiting, diarrhea, constipation, blood in stool and melena.  Genitourinary: Negative for dysuria, urgency, frequency, hematuria and flank pain.  Musculoskeletal: Negative for myalgias, back pain, joint pain and falls.  Skin: Negative for itching and  rash.  Neurological: Negative for dizziness, tingling, tremors, sensory change, speech change, focal weakness, seizures, loss of consciousness, weakness and headaches.  Endo/Heme/Allergies: Negative for environmental allergies and polydipsia. Does not bruise/bleed easily.  Psychiatric/Behavioral: Negative for depression, suicidal ideas, hallucinations, memory loss and substance abuse. The patient is not nervous/anxious and does not have insomnia.        Objective:  Blood pressure 120/80, pulse 80, height 5\' 7"  (1.702 m), weight 236 lb (107.049 kg), last menstrual period 09/28/2014.   Physical Exam  Vitals reviewed. Constitutional: She is oriented to person, place, and time. She appears well-developed and well-nourished.  HENT:  Head: Normocephalic and atraumatic.        Right Ear: External ear normal.  Left Ear: External ear normal.  Nose: Nose normal.  Mouth/Throat: Oropharynx is clear and moist.  Eyes: Conjunctivae and EOM are normal. Pupils are equal, round, and reactive to light. Right eye exhibits no discharge. Left eye exhibits no discharge. No scleral icterus.  Neck: Normal range of motion. Neck supple. No tracheal deviation present. No thyromegaly present.  Cardiovascular: Normal rate, regular rhythm, normal heart sounds and intact distal pulses.  Exam reveals no gallop and no friction rub.   No murmur heard. Respiratory: Effort normal and breath sounds normal. No respiratory distress. She has no wheezes. She has no rales. She exhibits no tenderness.  GI: Soft. Bowel sounds are normal. She exhibits no distension and no mass. There is no tenderness. There is no rebound and no guarding.  Genitourinary:  Breasts no masses skin changes or nipple changes bilaterally      Vulva is normal without lesions Vagina is pink moist without discharge Cervix normal in appearance and pap is done Uterus is normal size shape and contour Adnexa is negative with normal sized ovaries    Musculoskeletal: Normal range of motion. She exhibits no edema and no tenderness.  Neurological: She is alert and oriented to person, place, and time. She has normal reflexes. She displays normal reflexes. No cranial nerve deficit. She exhibits normal muscle tone. Coordination normal.  Skin: Skin is warm and dry. No rash noted. No erythema. No pallor.  Psychiatric: She has a normal mood and affect. Her behavior is normal. Judgment and thought content normal.       Assessment:    Healthy female exam.   Menorrhagia dysmenorrhea Plan:    Contraception: condoms. Mammogram ordered. Follow up in: 1 month. megace and so sonogram for evaluation

## 2014-10-06 ENCOUNTER — Telehealth: Payer: Self-pay | Admitting: Obstetrics & Gynecology

## 2014-10-06 LAB — HEPATITIS C ANTIBODY: Hep C Virus Ab: <0.1 s/co ratio (ref 0.0–0.9)

## 2014-10-06 LAB — HIV ANTIBODY (ROUTINE TESTING W REFLEX): HIV Screen 4th Generation wRfx: NONREACTIVE

## 2014-10-06 LAB — RPR: RPR Ser Ql: NONREACTIVE

## 2014-10-06 LAB — CYTOLOGY - PAP

## 2014-10-06 NOTE — Telephone Encounter (Signed)
Pt informed HIV, RPR, Hep C results from 10/05/2014 negative, PAP pending. Pt verbalized understanding.

## 2014-10-07 NOTE — Progress Notes (Signed)
[name], female   DOB: 04/29/1965, 49 y.o.   MRN: [id number]     Cardiology Office Note   Date:  10/08/2014   ID:  Summer Carpenter, DOB 1965/05/31, MRN [id number]  PCP:  Geroge Baseman  Cardiologist:   Sanda Klein, MD   Chief Complaint  Patient presents with  . Follow-up    Increasing palpitations over the past 2 months with occas. dizziness.  No chest pain.  Bilateral ankle edema.      History of Present Illness: [name] is a 49 y.o. female who presents follow-up for palpitations. She has systemic hypertension. Previous monitoring has not shown any evidence of serious arrhythmia. She has normal left ventricular size, wall thickness, systolic and diastolic function and no valvular abnormalities by echocardiography performed in September 2014. She is compliant with a statin for hyperlipidemia and CPAP for obstructive sleep apnea. Propranolol suppressed the palpitations, but caused fatigue. Nebivolol was not as effective at symptom control. Diuretic induced hypokalemia seems to exacerbated the palpitations.  We attempted to treat her lower extremity edema with furosemide administered only as needed, but this was complicated by low blood pressure. When the diuretic was stopped altogether, she developed pedal edema. She is now back on hydrochlorothiazide. The palpitations restarted promptly when the diuretic was restarted. We previously tried to compensate for thiazide-induced hypokalemia with triamterene, unsuccessfully.    Past Medical History  Diagnosis Date  . Hypertension   . Anxiety   . Hyperlipidemia   . GERD (gastroesophageal reflux disease)   . Prediabetes   . Palpitations   . Obesity   . Sleep apnea   . Sleep apnea     Past Surgical History  Procedure Laterality Date  . Cholecystectomy    . Doppler echocardiography  07/30/2000 [place]    EF 55-66%,lv normal  . Nm myocar perf wall motion  10/22/2011  . Cardiac  catheterization  11/25/2001    norm. LV fx,norm. abd aorta, both renal arteries patent  . Oophorectomy  2008    lt     Current Outpatient Prescriptions  Medication Sig Dispense Refill  . aspirin EC 81 MG tablet Take 81 mg by mouth daily.      . hydrochlorothiazide (HYDRODIURIL) 25 MG tablet Take 12.5 mg by mouth daily.    . megestrol (MEGACE) 40 MG tablet Take 1 tablet (40 mg total) by mouth daily. 30 tablet 1  . omeprazole (PRILOSEC) 20 MG capsule Take 20 mg by mouth daily.    . propranolol ER (INDERAL LA) 120 MG 24 hr capsule Take 2 capsules (240 mg total) by mouth daily. (Patient taking differently: Take 120 mg by mouth 2 (two) times daily. ) 60 capsule 6  . sertraline (ZOLOFT) 50 MG tablet Take 50 mg by mouth daily.    . simvastatin (ZOCOR) 10 MG tablet Take 10 mg by mouth at bedtime.     . valACYclovir (VALTREX) 500 MG tablet Take 500 mg by mouth daily as needed (cold sores).     Marland Kitchen spironolactone (ALDACTONE) 25 MG tablet Take 1 tablet (25 mg total) by mouth daily. 90 tablet 3   No current facility-administered medications for this visit.    Allergies:   Septra    Social History:  The patient  reports that she has never smoked. She has never used smokeless tobacco. She reports that she drinks alcohol. She reports that she does not use illicit drugs.   Family History:  The patient's family history includes Early death  in her brother; Heart attack in her mother; Hypertension in her brother, brother, mother, sister, and son; Seizures in her father.    ROS:  Please see the history of present illness.    Otherwise, review of systems positive for none.   All other systems are reviewed and negative.    PHYSICAL EXAM: VS:  BP 132/76 mmHg  Pulse 66  Ht 5\' 7"  (1.702 m)  Wt 235 lb 14.4 oz (107.004 kg)  BMI 36.94 kg/m2  LMP 09/28/2014 , BMI Body mass index is 36.94 kg/(m^2).  General: Alert, oriented x3, no distress Head: no evidence of trauma, PERRL, EOMI, no exophtalmos or lid  lag, no myxedema, no xanthelasma; normal ears, nose and oropharynx Neck: normal jugular venous pulsations and no hepatojugular reflux; brisk carotid pulses without delay and no carotid bruits Chest: clear to auscultation, no signs of consolidation by percussion or palpation, normal fremitus, symmetrical and full respiratory excursions Cardiovascular: normal position and quality of the apical impulse, regular rhythm, normal first and second heart sounds, no murmurs, rubs or gallops Abdomen: no tenderness or distention, no masses by palpation, no abnormal pulsatility or arterial bruits, normal bowel sounds, no hepatosplenomegaly Extremities: no clubbing, cyanosis or edema; 2+ radial, ulnar and brachial pulses bilaterally; 2+ right femoral, posterior tibial and dorsalis pedis pulses; 2+ left femoral, posterior tibial and dorsalis pedis pulses; no subclavian or femoral bruits Neurological: grossly nonfocal Psych: euthymic mood, full affect   EKG:  EKG is not ordered today.   Recent Labs: 07/14/2014: Magnesium 1.9; TSH 0.833 08/07/2014: Hemoglobin 10.6*; Platelets 369 08/18/2014: BUN 11; Creat 0.99; Potassium 4.3; Sodium 139    Lipid Panel No results found for: CHOL, TRIG, HDL, CHOLHDL, VLDL, LDLCALC, LDLDIRECT    Wt Readings from Last 3 Encounters:  10/08/14 235 lb 14.4 oz (107.004 kg)  10/05/14 236 lb (107.049 kg)  08/20/14 234 lb 12.8 oz (106.505 kg)     ASSESSMENT AND PLAN:   Mrs. Valladares seems to have a fairly clear association between hypokalemia and palpitations, but she requires diuretic therapy for symptomatic pedal edema. We'll try to see if spironolactone added to a lower dose of thiazide diuretic controls both problems. Reduce hydrochlorothiazide to half the current dose and add spironolactone 25 mg daily. Recheck labs and reevaluate symptom control in a few weeks.  Current medicines are reviewed at length with the patient today.  The patient does not have concerns regarding  medicines.  Labs/ tests ordered today include:   Orders Placed This Encounter  Procedures  . Basic metabolic panel   Patient Instructions  Medication Instructions:   DECREASE HCTZ TO 12.5MG  DAILY  START SPIRONOLACTONE 25MG  DAILY  Labwork:  2-3 WEEKS GO TO [facility] AND WE WILL CALL YOU WITH THE RESULTS  Testing/Procedures:  NONE  Follow-Up:  6 MONTHS  Any Other Special Instructions Will Be Listed Below (If Applicable).  CALL IN 3 WEEKS AND LET us KNOW IF YOUR SWELLING/BLOOD PRESSURE/PALPITATIONS HAVE DECREASED OR STAYED THE SAME.  #[phone]        Mikael Spray, MD  10/08/2014 12:37 PM    Sanda Klein, MD, [facility] [phone] office [phone] pager

## 2014-10-08 ENCOUNTER — Encounter: Payer: Self-pay | Admitting: Cardiovascular Disease

## 2014-10-08 ENCOUNTER — Ambulatory Visit (INDEPENDENT_AMBULATORY_CARE_PROVIDER_SITE_OTHER): Payer: PRIVATE HEALTH INSURANCE | Admitting: Cardiovascular Disease

## 2014-10-08 VITALS — BP 132/76 | HR 66 | Ht 67.0 in | Wt 235.9 lb

## 2014-10-08 DIAGNOSIS — Z79899 Other long term (current) drug therapy: Secondary | ICD-10-CM | POA: Diagnosis not present

## 2014-10-08 DIAGNOSIS — R0683 Snoring: Secondary | ICD-10-CM | POA: Diagnosis not present

## 2014-10-08 DIAGNOSIS — I10 Essential (primary) hypertension: Secondary | ICD-10-CM | POA: Diagnosis not present

## 2014-10-08 MED ORDER — SPIRONOLACTONE 25 MG PO TABS: 25.0000 mg | ORAL_TABLET | Freq: Every day | ORAL | Status: DC

## 2014-10-08 NOTE — Patient Instructions (Signed)
Medication Instructions:   DECREASE HCTZ TO 12.5MG  DAILY  START SPIRONOLACTONE 25MG  DAILY  Labwork:  2-3 WEEKS GO TO [facility] AND WE WILL CALL YOU WITH THE RESULTS  Testing/Procedures:  NONE  Follow-Up:  6 MONTHS  Any Other Special Instructions Will Be Listed Below (If Applicable).  CALL IN 3 WEEKS AND LET us KNOW IF YOUR SWELLING/BLOOD PRESSURE/PALPITATIONS HAVE DECREASED OR STAYED THE SAME.  #[phone]

## 2014-10-18 ENCOUNTER — Other Ambulatory Visit: Payer: Self-pay | Admitting: *Deleted

## 2014-10-18 MED ORDER — PROPRANOLOL HCL ER 120 MG PO CP24: 120.0000 mg | ORAL_CAPSULE | Freq: Two times a day (BID) | ORAL | Status: DC

## 2014-10-19 ENCOUNTER — Telehealth: Payer: Self-pay | Admitting: Cardiovascular Disease

## 2014-10-19 NOTE — Telephone Encounter (Signed)
°  1. Which medications need to be refilled? Propranolol-needs 90 days so her insurance will pay  2. Which pharmacy is medication to be sent to?[facility],[place]-she did not know the phone number  3. Do they need a 30 day or 90 day supply? 90 and refill  4. Would they like a call back once the medication has been sent to the pharmacy? no

## 2014-10-19 NOTE — Telephone Encounter (Signed)
Medication Detail      Disp Refills Start End     propranolol ER (INDERAL LA) 120 MG 24 hr capsule 180 capsule 1 10/18/2014     Sig - Route: Take 1 capsule (120 mg total) by mouth 2 (two) times daily. - Oral    E-Prescribing Status: Receipt confirmed by pharmacy (10/18/2014 5:51 PM EDT)    Refilled to [facility] in [place] 10/18/14

## 2014-10-22 ENCOUNTER — Other Ambulatory Visit: Payer: Self-pay | Admitting: *Deleted

## 2014-10-22 MED ORDER — VALACYCLOVIR HCL 500 MG PO TABS: ORAL_TABLET | ORAL | Status: DC

## 2014-10-24 ENCOUNTER — Other Ambulatory Visit: Payer: Self-pay | Admitting: Cardiovascular Disease

## 2014-11-01 ENCOUNTER — Telehealth: Payer: Self-pay | Admitting: *Deleted

## 2014-11-01 NOTE — Telephone Encounter (Signed)
Pt states she saw on line that Megestrol was used for breast cancer. Wants to know why Dr. Elonda Husky prescribed for her. Pt informed our providers use Megace for multiple symptoms but primarily to stop uterine bleeding. Pt verbalized understanding.

## 2014-11-02 ENCOUNTER — Encounter: Payer: Self-pay | Admitting: Obstetrics & Gynecology

## 2014-11-02 ENCOUNTER — Ambulatory Visit (INDEPENDENT_AMBULATORY_CARE_PROVIDER_SITE_OTHER): Payer: PRIVATE HEALTH INSURANCE

## 2014-11-02 ENCOUNTER — Ambulatory Visit (INDEPENDENT_AMBULATORY_CARE_PROVIDER_SITE_OTHER): Payer: PRIVATE HEALTH INSURANCE | Admitting: Obstetrics & Gynecology

## 2014-11-02 VITALS — BP 120/70 | HR 72 | Wt 238.0 lb

## 2014-11-02 DIAGNOSIS — N946 Dysmenorrhea, unspecified: Secondary | ICD-10-CM | POA: Diagnosis not present

## 2014-11-02 DIAGNOSIS — D250 Submucous leiomyoma of uterus: Secondary | ICD-10-CM | POA: Diagnosis not present

## 2014-11-02 DIAGNOSIS — N920 Excessive and frequent menstruation with regular cycle: Secondary | ICD-10-CM | POA: Diagnosis not present

## 2014-11-02 NOTE — Progress Notes (Signed)
[name], female   DOB: May 11, 1965, 49 y.o.   MRN: [id number] Preoperative History and Physical  Summer Carpenter is a 49 y.o. [phone] with Patient's last menstrual period was 09/28/2014. admitted for hysteroscopy uterine curettage with removal of a submucosal myoma found today and sonogram and endometrial ablation  Patient is been having increasing difficulty with heaviness of vaginal bleeding clots and severe cramping is taken a dramatic increase in volume and pain over the last 6-12 months Sonogram performed in the office reveals a submucosal myoma versus polyp favor myoma which is most likely responsible for the change in the patient's menstrual problems  PMH:    Past Medical History  Diagnosis Date  . Hypertension   . Anxiety   . Hyperlipidemia   . GERD (gastroesophageal reflux disease)   . Prediabetes   . Palpitations   . Obesity   . Sleep apnea   . Sleep apnea     PSH:     Past Surgical History  Procedure Laterality Date  . Cholecystectomy    . Doppler echocardiography  07/30/2000 [place]    EF 55-66%,lv normal  . Nm myocar perf wall motion  10/22/2011  . Cardiac catheterization  11/25/2001    norm. LV fx,norm. abd aorta, both renal arteries patent  . Oophorectomy  2008    lt    POb/GynH:      OB History    Gravida Para Term Preterm AB TAB SAB Ectopic Multiple Living   6 3 3  3  3          SH:   History  Substance Use Topics  . Smoking status: Never Smoker   . Smokeless tobacco: Never Used  . Alcohol Use: Yes     Comment: Occasionally, once a month    FH:    Family History  Problem Relation Age of Onset  . Hypertension Mother   . Heart attack Mother   . Seizures Father   . Hypertension Brother   . Hypertension Brother   . Hypertension Sister   . Early death Brother   . Hypertension Son      Allergies:  Allergies  Allergen Reactions  . Septra [Bactrim] Hives and Diarrhea    Medications:       Current  outpatient prescriptions:  .  aspirin EC 81 MG tablet, Take 81 mg by mouth daily.  , Disp: , Rfl:  .  hydrochlorothiazide (HYDRODIURIL) 25 MG tablet, Take 12.5 mg by mouth daily., Disp: , Rfl:  .  megestrol (MEGACE) 40 MG tablet, Take 1 tablet (40 mg total) by mouth daily., Disp: 30 tablet, Rfl: 1 .  omeprazole (PRILOSEC) 20 MG capsule, Take 20 mg by mouth daily., Disp: , Rfl:  .  propranolol ER (INDERAL LA) 120 MG 24 hr capsule, Take 1 capsule (120 mg total) by mouth 2 (two) times daily., Disp: 180 capsule, Rfl: 1 .  sertraline (ZOLOFT) 50 MG tablet, Take 50 mg by mouth daily., Disp: , Rfl:  .  simvastatin (ZOCOR) 10 MG tablet, Take 10 mg by mouth at bedtime. , Disp: , Rfl:  .  valACYclovir (VALTREX) 500 MG tablet, Take one tablet by mouth once daily, Disp: 30 tablet, Rfl: 6 .  propranolol ER (INDERAL LA) 120 MG 24 hr capsule, TAKE 2 CAPSULES (240 MG TOTAL) BY MOUTH DAILY. (Patient not taking: Reported on 11/02/2014), Disp: 180 capsule, Rfl: 1 .  spironolactone (ALDACTONE) 25 MG tablet, Take 1 tablet (25 mg total) by mouth daily. (  Patient not taking: Reported on 11/09/2014), Disp: 90 tablet, Rfl: 3  Review of Systems:   Review of Systems  Constitutional: Negative for fever, chills, weight loss, malaise/fatigue and diaphoresis.  HENT: Negative for hearing loss, ear pain, nosebleeds, congestion, sore throat, neck pain, tinnitus and ear discharge.   Eyes: Negative for blurred vision, double vision, photophobia, pain, discharge and redness.  Respiratory: Negative for cough, hemoptysis, sputum production, shortness of breath, wheezing and stridor.   Cardiovascular: Negative for chest pain, palpitations, orthopnea, claudication, leg swelling and PND.  Gastrointestinal: Positive for abdominal pain. Negative for heartburn, nausea, vomiting, diarrhea, constipation, blood in stool and melena.  Genitourinary: Negative for dysuria, urgency, frequency, hematuria and flank pain.  Musculoskeletal: Negative for  myalgias, back pain, joint pain and falls.  Skin: Negative for itching and rash.  Neurological: Negative for dizziness, tingling, tremors, sensory change, speech change, focal weakness, seizures, loss of consciousness, weakness and headaches.  Endo/Heme/Allergies: Negative for environmental allergies and polydipsia. Does not bruise/bleed easily.  Psychiatric/Behavioral: Negative for depression, suicidal ideas, hallucinations, memory loss and substance abuse. The patient is not nervous/anxious and does not have insomnia.      PHYSICAL EXAM:  Blood pressure 120/70, pulse 72, weight 238 lb (107.956 kg), last menstrual period 09/28/2014.    Vitals reviewed. Constitutional: She is oriented to person, place, and time. She appears well-developed and well-nourished.  HENT:  Head: Normocephalic and atraumatic.  Right Ear: External ear normal.  Left Ear: External ear normal.  Nose: Nose normal.  Mouth/Throat: Oropharynx is clear and moist.  Eyes: Conjunctivae and EOM are normal. Pupils are equal, round, and reactive to light. Right eye exhibits no discharge. Left eye exhibits no discharge. No scleral icterus.  Neck: Normal range of motion. Neck supple. No tracheal deviation present. No thyromegaly present.  Cardiovascular: Normal rate, regular rhythm, normal heart sounds and intact distal pulses.  Exam reveals no gallop and no friction rub.   No murmur heard. Respiratory: Effort normal and breath sounds normal. No respiratory distress. She has no wheezes. She has no rales. She exhibits no tenderness.  GI: Soft. Bowel sounds are normal. She exhibits no distension and no mass. There is tenderness. There is no rebound and no guarding.  Genitourinary:       Vulva is normal without lesions Vagina is pink moist without discharge Cervix normal in appearance and pap is normal Uterus is normal size, contour, position, consistency, mobility, non-tender Adnexa is negative with normal sized ovaries by  sonogram  Musculoskeletal: Normal range of motion. She exhibits no edema and no tenderness.  Neurological: She is alert and oriented to person, place, and time. She has normal reflexes. She displays normal reflexes. No cranial nerve deficit. She exhibits normal muscle tone. Coordination normal.  Skin: Skin is warm and dry. No rash noted. No erythema. No pallor.  Psychiatric: She has a normal mood and affect. Her behavior is normal. Judgment and thought content normal.    Labs: No results found for this or any previous visit (from the past 336 hour(s)).  EKG: Orders placed or performed during the hospital encounter of 08/07/14  . ED EKG (<85mins upon arrival to the ED)  . ED EKG (<2mins upon arrival to the ED)  . EKG 12-Lead  . EKG 12-Lead  . EKG    Imaging Studies: US Transvaginal Non-ob  November 09, 2014   GYNECOLOGIC SONOGRAM   Summer Carpenter is a 49 y.o. [id number] LMP 09/28/2014 for a pelvic  sonogram for menorrhagia/dysmenorrhea.  Uterus  9.7 x 6.6 x 6.2 cm, anteverted heterogenous  uterus w/mult fibroids,largest fibroids (#1)subserosal post mid uterus  2.6x 2.2 x 1.3cm,(#2)intramural mid uterus 2.2 x 2.1 x 2.8cm,(#3)  submucosal fibroid                                     mid ut 1.8  x 1.2 x  1.6cm deforming endometrial cavity,  Endometrium          23 mm, asymmetrical,submucosal fibroid deforming  endometrial cavity  Right ovary             3.3 x 3.3 x 1.8 cm, normal  Left ovary               Lt oophorectomy    Technician Comments:   PELVIC US TA/TV: anteverted heterogenous uterus w/mult fibroids,largest  fibroids (#1)subserosal post mid uterus 2.6x 2.2 x 1.3cm,(#2)intramural  mid uterus 2.2 x 2.1 x 2.8cm,(#3) submucosal fibroid mid ut 1.8  x 1.2 x  1.6cm deforming endometrial cavity, thick eec 70mm,normal rt ov,lt  oophorectomy    Silver Huguenin 11/02/2014 9:49 AM    US Pelvis Complete  11/02/2014   GYNECOLOGIC SONOGRAM   Summer Carpenter is a 49 y.o. [id number] LMP  09/28/2014 for a pelvic  sonogram for menorrhagia/dysmenorrhea.  Uterus                    9.7 x 6.6 x 6.2 cm, anteverted heterogenous  uterus w/mult fibroids,largest fibroids (#1)subserosal post mid uterus  2.6x 2.2 x 1.3cm,(#2)intramural mid uterus 2.2 x 2.1 x 2.8cm,(#3)  submucosal fibroid                                     mid ut 1.8  x 1.2 x  1.6cm deforming endometrial cavity,  Endometrium          23 mm, asymmetrical,submucosal fibroid deforming  endometrial cavity  Right ovary             3.3 x 3.3 x 1.8 cm, normal  Left ovary               Lt oophorectomy    Technician Comments:   PELVIC US TA/TV: anteverted heterogenous uterus w/mult fibroids,largest  fibroids (#1)subserosal post mid uterus 2.6x 2.2 x 1.3cm,(#2)intramural  mid uterus 2.2 x 2.1 x 2.8cm,(#3) submucosal fibroid mid ut 1.8  x 1.2 x  1.6cm deforming endometrial cavity, thick eec 81mm,normal rt ov,lt  oophorectomy    Silver Huguenin 11/02/2014 9:49 AM       Assessment: Patient Active Problem List   Diagnosis Date Noted  . Encounter for other contraceptive management 02/04/2014  . Hypokalemia 08/28/2013  . Anemia 08/28/2013  . OSA (obstructive sleep apnea) 12/22/2012  . Fatigue 10/08/2012  . Snoring 10/08/2012  . Palpitations 09/14/2012  . Essential hypertension 09/14/2012  . Hyperlipidemia 09/14/2012  . Obesity (BMI 35.0-39.9 without comorbidity) 09/14/2012    Plan: Hysteroscopy D&C endometrial ablation with removal of submucosal myoma conventionally or with holmium laser In the meantime the patient be maintained a megestrol therapy to suppress her bleeding  Loney Peto H 11/02/2014 9:51 AM

## 2014-11-02 NOTE — Progress Notes (Signed)
PELVIC US TA/TV: anteverted heterogenous uterus w/mult fibroids,largest fibroids (#1)subserosal post mid uterus 2.6x 2.2 x 1.3cm,(#2)intramural mid uterus 2.2 x 2.1 x 2.8cm,(#3) submucosal fibroid mid ut 1.8  x 1.2 x 1.6cm deforming endometrial cavity, thick eec 64mm,normal rt ov,lt oophorectomy

## 2014-11-03 ENCOUNTER — Encounter: Payer: Self-pay | Admitting: Cardiovascular Disease

## 2014-11-09 ENCOUNTER — Other Ambulatory Visit: Payer: Self-pay | Admitting: *Deleted

## 2014-11-09 MED ORDER — POTASSIUM CHLORIDE ER 10 MEQ PO TBCR: 10.0000 meq | EXTENDED_RELEASE_TABLET | Freq: Every day | ORAL | Status: DC

## 2014-11-09 NOTE — Telephone Encounter (Signed)
Patient notified to start potassium 61mEq per Dr. Loletha Grayer Rx(s) sent to pharmacy electronically.

## 2014-11-12 ENCOUNTER — Ambulatory Visit (HOSPITAL_COMMUNITY)
Admission: RE | Admit: 2014-11-12 | Discharge: 2014-11-12 | Disposition: A | Discharge status: Home / Self Care | Payer: PRIVATE HEALTH INSURANCE | Source: Ambulatory Visit | Attending: Obstetrics and Gynecology | Admitting: Obstetrics and Gynecology

## 2014-11-12 DIAGNOSIS — Z1231 Encounter for screening mammogram for malignant neoplasm of breast: Secondary | ICD-10-CM | POA: Insufficient documentation

## 2014-11-13 ENCOUNTER — Emergency Department (HOSPITAL_COMMUNITY): Payer: PRIVATE HEALTH INSURANCE

## 2014-11-13 ENCOUNTER — Encounter (HOSPITAL_COMMUNITY): Payer: Self-pay | Admitting: Emergency Medicine

## 2014-11-13 ENCOUNTER — Emergency Department (HOSPITAL_COMMUNITY)
Admission: EM | Admit: 2014-11-13 | Discharge: 2014-11-13 | Disposition: A | Discharge status: Home / Self Care | Payer: PRIVATE HEALTH INSURANCE | Attending: Emergency Medicine | Admitting: Emergency Medicine

## 2014-11-13 DIAGNOSIS — K5900 Constipation, unspecified: Secondary | ICD-10-CM | POA: Diagnosis not present

## 2014-11-13 DIAGNOSIS — I10 Essential (primary) hypertension: Secondary | ICD-10-CM | POA: Diagnosis not present

## 2014-11-13 DIAGNOSIS — E669 Obesity, unspecified: Secondary | ICD-10-CM | POA: Insufficient documentation

## 2014-11-13 DIAGNOSIS — Z7982 Long term (current) use of aspirin: Secondary | ICD-10-CM | POA: Insufficient documentation

## 2014-11-13 DIAGNOSIS — Z3202 Encounter for pregnancy test, result negative: Secondary | ICD-10-CM | POA: Insufficient documentation

## 2014-11-13 DIAGNOSIS — E785 Hyperlipidemia, unspecified: Secondary | ICD-10-CM | POA: Diagnosis not present

## 2014-11-13 DIAGNOSIS — Z79899 Other long term (current) drug therapy: Secondary | ICD-10-CM | POA: Diagnosis not present

## 2014-11-13 DIAGNOSIS — Z8669 Personal history of other diseases of the nervous system and sense organs: Secondary | ICD-10-CM | POA: Insufficient documentation

## 2014-11-13 DIAGNOSIS — K219 Gastro-esophageal reflux disease without esophagitis: Secondary | ICD-10-CM | POA: Diagnosis not present

## 2014-11-13 DIAGNOSIS — F419 Anxiety disorder, unspecified: Secondary | ICD-10-CM | POA: Insufficient documentation

## 2014-11-13 LAB — POC URINE PREG, ED: Preg Test, Ur: NEGATIVE

## 2014-11-13 MED ORDER — POLYETHYLENE GLYCOL 3350 17 GM/SCOOP PO POWD
1.0000 | Freq: Once | ORAL | Status: DC
Start: 2014-11-13 — End: 2015-04-01

## 2014-11-13 NOTE — ED Provider Notes (Signed)
CSN: [id number]     Arrival date & time 11/13/14  1733 History   First MD Initiated Contact with Patient 11/13/14 1818     Chief Complaint  Patient presents with  . Constipation     (Consider location/radiation/quality/duration/timing/severity/associated sxs/prior Treatment) HPI   49 year old female who presents with constipation. She reports having small daily bowel movements for one month since starting Megace. She has a history of cholecystectomy and refer to me. Denies any abdominal distention, nausea, vomiting, bloody stools, fevers, chills , or abdominal pain. Eating normally. Reports that constipation has been worsening for past 2 weeks that she has often needed to take some type of medication to have a bowel movement. She had a small bowel movement today that gave herself a Fleet enema to try to have a large larger bowel movement. This did not work and she came to our ED for evaluation. Reports that she is seeing GI for this issue and has a follow-up appointment this week. Only takes stool softener. Tried magnesium citrate also and does not seem to work. Continues to pass gas.  Past Medical History  Diagnosis Date  . Hypertension   . Anxiety   . Hyperlipidemia   . GERD (gastroesophageal reflux disease)   . Prediabetes   . Palpitations   . Obesity   . Sleep apnea   . Sleep apnea    Past Surgical History  Procedure Laterality Date  . Cholecystectomy    . Doppler echocardiography  07/30/2000 [place]    EF 55-66%,lv normal  . Nm myocar perf wall motion  10/22/2011  . Cardiac catheterization  11/25/2001    norm. LV fx,norm. abd aorta, both renal arteries patent  . Oophorectomy  2008    lt   Family History  Problem Relation Age of Onset  . Hypertension Mother   . Heart attack Mother   . Seizures Father   . Hypertension Brother   . Hypertension Brother   . Hypertension Sister   . Early death Brother   . Hypertension Son    History  Substance Use Topics  . Smoking  status: Never Smoker   . Smokeless tobacco: Never Used  . Alcohol Use: Yes     Comment: Occasionally, once a month   OB History    Gravida Para Term Preterm AB TAB SAB Ectopic Multiple Living   6 3 3  3  3         Review of Systems 10/14 systems reviewed and are negative other than those stated in the HPI   Allergies  Septra  Home Medications   Prior to Admission medications   Medication Sig Start Date End Date Taking? Authorizing Provider  aspirin EC 81 MG tablet Take 81 mg by mouth daily.     Yes Historical Provider, MD  hydrochlorothiazide (HYDRODIURIL) 25 MG tablet Take 12.5 mg by mouth daily.   Yes Historical Provider, MD  megestrol (MEGACE) 40 MG tablet Take 1 tablet (40 mg total) by mouth daily. 10/05/14  Yes Florian Buff, MD  omeprazole (PRILOSEC) 20 MG capsule Take 20 mg by mouth daily.   Yes Historical Provider, MD  propranolol ER (INDERAL LA) 120 MG 24 hr capsule TAKE 2 CAPSULES (240 MG TOTAL) BY MOUTH DAILY. 10/25/14  Yes Mihai Croitoru, MD  sertraline (ZOLOFT) 50 MG tablet Take 50 mg by mouth daily.   Yes Historical Provider, MD  simvastatin (ZOCOR) 10 MG tablet Take 10 mg by mouth at bedtime.    Yes Historical Provider, MD  azithromycin (ZITHROMAX) 250 MG tablet TAKE 2 TABLETS BY MOUTH TODAY, THEN TAKE 1 TABLET DAILY FOR 4 DAYS 11/02/14   Historical Provider, MD  polyethylene glycol powder (GLYCOLAX/MIRALAX) powder Take 255 g by mouth once. Take one cap full daily daily dissolved in liquid.  If no effect in 3 days, take two capfuls daily. 11/13/14   Forde Dandy, MD  potassium chloride (K-DUR) 10 MEQ tablet Take 1 tablet (10 mEq total) by mouth daily. Patient not taking: Reported on 11/13/2014 11/09/14   Sanda Klein, MD  spironolactone (ALDACTONE) 25 MG tablet Take 1 tablet (25 mg total) by mouth daily. Patient not taking: Reported on 11/13/2014 10/08/14   Sanda Klein, MD  valACYclovir (VALTREX) 500 MG tablet Take one tablet by mouth once daily Patient taking differently:  Take 500 mg by mouth daily as needed (for outbreaks). Take one tablet by mouth once daily 10/22/14   Christin Fudge, CNM   BP 122/73 mmHg  Pulse 82  Temp(Src) 98.5 F (36.9 C)  Resp 16  Ht 5\' 7"  (1.702 m)  Wt 240 lb (108.863 kg)  BMI 37.58 kg/m2  SpO2 99%  LMP 09/28/2014 Physical Exam  Nursing note and vitals reviewed. Physical Exam  Constitutional: Well developed, well nourished, non-toxic, and in no acute distress Head: Normocephalic and atraumatic.  Mouth/Throat: Oropharynx is clear and moist.  Neck: Normal range of motion. Neck supple.  Cardiovascular: Normal rate and regular rhythm.   Pulmonary/Chest: Effort normal and breath sounds normal.  Abdominal: Soft. There is no tenderness. There is no rebound and no guarding. Normal rectal tone, without fecal impaction.  Musculoskeletal: Normal range of motion.  Neurological: Alert, no facial droop, fluent speech, moves all extremities symmetrically Skin: Skin is warm and dry.  Psychiatric: Cooperative   ED Course  Procedures (including critical care time) Labs Review Labs Reviewed  POC URINE PREG, ED    Imaging Review Dg Abd 1 View  11/13/2014   CLINICAL DATA:  Patient with constipation.  EXAM: ABDOMEN - 1 VIEW  COMPARISON:  None.  FINDINGS: Stool is demonstrated throughout the colon. Nonobstructed bowel gas pattern. Cholecystectomy clips. Regional skeleton is unremarkable.  IMPRESSION: Stool throughout the colon as can be seen with constipation.  No evidence for obstruction.   Electronically Signed   By: Lovey Newcomer M.D.   On: 11/13/2014 19:57     EKG Interpretation None      MDM   Final diagnoses:  Constipation, unspecified constipation type     in short, this is a 49 year old female who presents with constipation. She is nontoxic and in no acute distress on presentation. Her vital signs are non-concerning in the emergency department. She is a soft  nondistended and nontender abdomen. No evidence of fecal  impaction on exam. Stool is demonstrated throughout the colon on abdominal x-ray with nonobstructed bowel gas pattern. Clinically she does not appear obstructed as she continues to have small bowel movements per day without abdominal pain , vomiting, or distention. She has no other symptoms that would be suggestive of acute intra-abdominal process including  Infectious or vascular pathology. I did offer her another enema , but at this time she has deferred. She will initiate MiraLAX with her stool softener. If this does not work she'll discuss with her GI physician about other possibilities such as GoLYTELY. Strict return instructions were also reviewed. She expresses understanding of all discharge instructions for comfortable with the plan of care.    Forde Dandy, MD 11/13/14 [phone]

## 2014-11-13 NOTE — ED Notes (Signed)
Having constipation for last 30 days.  Increasing discomfort.  Has appointment with Dr Posey Pronto in [place].  Took enema at 1 pm with not relief.  Last BM this am abut only a very small amount.  Have tried several OTC with no relief.  Started megestrol to regulate period in May and this is when she notice changes in her bowel pattern.  Since May she has had to use laxative to make bowels move.  Denies any pain.

## 2014-11-13 NOTE — Discharge Instructions (Signed)
Please follow-up with your GI physician next week to discuss need to start on a bowel prep such as GoLYTELY. Take MiraLAX as prescribed.  Continue to take her Colace as well. Return without fail for worsening symptoms, including increased abdominal distention, abdominal pain, fevers, vomiting, or any other symptoms concerning to you.  Constipation Constipation is when a person:  Poops (has a bowel movement) less than 3 times a week.  Has a hard time pooping.  Has poop that is dry, hard, or bigger than normal. HOME CARE   Eat foods with a lot of fiber in them. This includes fruits, vegetables, beans, and whole grains such as brown rice.  Avoid fatty foods and foods with a lot of sugar. This includes french fries, hamburgers, cookies, candy, and soda.  If you are not getting enough fiber from food, take products with added fiber in them (supplements).  Drink enough fluid to keep your pee (urine) clear or pale yellow.  Exercise on a regular basis, or as told by your doctor.  Go to the restroom when you feel like you need to poop. Do not hold it.  Only take medicine as told by your doctor. Do not take medicines that help you poop (laxatives) without talking to your doctor first. GET HELP RIGHT AWAY IF:   You have bright red blood in your poop (stool).  Your constipation lasts more than 4 days or gets worse.  You have belly (abdominal) or butt (rectal) pain.  You have thin poop (as thin as a pencil).  You lose weight, and it cannot be explained. MAKE SURE YOU:   Understand these instructions.  Will watch your condition.  Will get help right away if you are not doing well or get worse. Document Released: 09/12/2007 Document Revised: 03/31/2013 Document Reviewed: 01/05/2013 [facility] Patient Information 2015 [place], [place]. This information is not intended to replace advice given to you by your health care provider. Make sure you discuss any questions you have with your health  care provider.

## 2014-11-22 ENCOUNTER — Ambulatory Visit: Payer: PRIVATE HEALTH INSURANCE | Admitting: Cardiovascular Disease

## 2014-12-09 NOTE — Patient Instructions (Addendum)
[facility] - Preparing for Surgery  Before surgery, you can play an important role.  Because skin is not sterile, your skin needs to be as free of germs as possible.  You can reduce the number of germs on you skin by washing with CHG (chlorahexidine gluconate) soap before surgery.  CHG is an antiseptic cleaner which kills germs and bonds with the skin to continue killing germs even after washing.  Please DO NOT use if you have an allergy to CHG or antibacterial soaps.  If your skin becomes reddened/irritated stop using the CHG and inform your nurse when you arrive at Short Stay.  Do not shave (including legs and underarms) for at least 48 hours prior to the first CHG shower.  You may shave your face.  Please follow these instructions carefully:   1.  Shower with CHG Soap the night before surgery and the                                morning of Surgery.  2.  If you choose to wash your hair, wash your hair first as usual with your       normal shampoo.  3.  After you shampoo, rinse your hair and body thoroughly to remove the                      Shampoo.  4.  Use CHG as you would any other liquid soap.  You can apply chg directly       to the skin and wash gently with scrungie or a clean washcloth.  5.  Apply the CHG Soap to your body ONLY FROM THE NECK DOWN.        Do not use on open wounds or open sores.  Avoid contact with your eyes,       ears, mouth and genitals (private parts).  Wash genitals (private parts)       with your normal soap.  6.  Wash thoroughly, paying special attention to the area where your surgery        will be performed.  7.  Thoroughly rinse your body with warm water from the neck down.  8.  DO NOT shower/wash with your normal soap after using and rinsing off       the CHG Soap.  9.  Pat yourself dry with a clean towel.            10.  Wear clean pajamas.            11.  Place clean sheets on your bed the night of your first shower and do not        sleep with  pets.           Do not apply any lotions/deoderants the morning of surgery.  Please wear clean clothes to the hospital/surgery center.    Your procedure is scheduled on: 12/15/2014  Report to [facility] at   6:15  AM.  Call this number if you have problems the morning of surgery: [phone]   Remember:   Do not drink or eat food:After Midnight.  :  Take these medicines the morning of surgery with A SIP OF WATER: Omeprazole, Propanolol and Sertraline   Do not wear jewelry, make-up or nail polish.  Do not wear lotions, powders, or perfumes. You may wear deodorant.  Do not shave 48 hours prior to surgery.  Men may shave face and neck.  Do not bring valuables to the hospital.  Contacts, dentures or bridgework may not be worn into surgery.  Leave suitcase in the car. After surgery it may be brought to your room.  For patients admitted to the hospital, checkout time is 11:00 AM the day of discharge.   Patients discharged the day of surgery will not be allowed to drive home.  Hysteroscopy, Care After Refer to this sheet in the next few weeks. These instructions provide you with information on caring for yourself after your procedure. Your health care provider may also give you more specific instructions. Your treatment has been planned according to current medical practices, but problems sometimes occur. Call your health care provider if you have any problems or questions after your procedure.  WHAT TO EXPECT AFTER THE PROCEDURE After your procedure, it is typical to have the following:  You may have some cramping. This normally lasts for a couple days.  You may have bleeding. This can vary from light spotting for a few days to menstrual-like bleeding for 3-7 days. HOME CARE INSTRUCTIONS  Rest for the first 1-2 days after the procedure.  Only take over-the-counter or prescription medicines as directed by your health care provider. Do not take aspirin. It can increase the chances  of bleeding.  Take showers instead of baths for 2 weeks or as directed by your health care provider.  Do not drive for 24 hours or as directed.  Do not drink alcohol while taking pain medicine.  Do not use tampons, douche, or have sexual intercourse for 2 weeks or until your health care provider says it is okay.  Take your temperature twice a day for 4-5 days. Write it down each time.  Follow your health care provider's advice about diet, exercise, and lifting.  If you develop constipation, you may:  Take a mild laxative if your health care provider approves.  Add bran foods to your diet.  Drink enough fluids to keep your urine clear or pale yellow.  Try to have someone with you or available to you for the first 24-48 hours, especially if you were given a general anesthetic.  Follow up with your health care provider as directed. SEEK MEDICAL CARE IF:  You feel dizzy or lightheaded.  You feel sick to your stomach (nauseous).  You have abnormal vaginal discharge.  You have a rash.  You have pain that is not controlled with medicine. SEEK IMMEDIATE MEDICAL CARE IF:  You have bleeding that is heavier than a normal menstrual period.  You have a fever.  You have increasing cramps or pain, not controlled with medicine.  You have new belly (abdominal) pain.  You pass out.  You have pain in the tops of your shoulders (shoulder strap areas).  You have shortness of breath. Document Released: 01/14/2013 Document Reviewed: 01/14/2013 [facility] Patient Information 2015 [place], [place]. This information is not intended to replace advice given to you by your health care provider. Make sure you discuss any questions you have with your health care provider.  General Anesthesia, Care After Refer to this sheet in the next few weeks. These instructions provide you with information on caring for yourself after your procedure. Your health care provider may also give you more specific  instructions. Your treatment has been planned according to current medical practices, but problems sometimes occur. Call your health care provider if you have any problems or questions after your procedure. WHAT TO EXPECT AFTER THE  PROCEDURE After the procedure, it is typical to experience:  Sleepiness.  Nausea and vomiting. HOME CARE INSTRUCTIONS  For the first 24 hours after general anesthesia:  Have a responsible person with you.  Do not drive a car. If you are alone, do not take public transportation.  Do not drink alcohol.  Do not take medicine that has not been prescribed by your health care provider.  Do not sign important papers or make important decisions.  You may resume a normal diet and activities as directed by your health care provider.  Change bandages (dressings) as directed.  If you have questions or problems that seem related to general anesthesia, call the hospital and ask for the anesthetist or anesthesiologist on call. SEEK MEDICAL CARE IF:  You have nausea and vomiting that continue the day after anesthesia.  You develop a rash. SEEK IMMEDIATE MEDICAL CARE IF:   You have difficulty breathing.  You have chest pain.  You have any allergic problems. Document Released: 07/02/2000 Document Revised: 03/31/2013 Document Reviewed: 10/09/2012 [facility] Patient Information 2015 [place], [place]. This information is not intended to replace advice given to you by your health care provider. Make sure you discuss any questions you have with your health care provider.

## 2014-12-10 ENCOUNTER — Encounter (HOSPITAL_COMMUNITY)
Admission: RE | Admit: 2014-12-10 | Discharge: 2014-12-10 | Disposition: A | Discharge status: Home / Self Care | Payer: PRIVATE HEALTH INSURANCE | Source: Ambulatory Visit | Attending: Obstetrics & Gynecology | Admitting: Obstetrics & Gynecology

## 2014-12-10 ENCOUNTER — Encounter (HOSPITAL_COMMUNITY): Payer: Self-pay

## 2014-12-10 DIAGNOSIS — Z01818 Encounter for other preprocedural examination: Secondary | ICD-10-CM | POA: Insufficient documentation

## 2014-12-10 DIAGNOSIS — N946 Dysmenorrhea, unspecified: Secondary | ICD-10-CM | POA: Insufficient documentation

## 2014-12-10 DIAGNOSIS — N921 Excessive and frequent menstruation with irregular cycle: Secondary | ICD-10-CM | POA: Insufficient documentation

## 2014-12-10 LAB — COMPREHENSIVE METABOLIC PANEL
ALK PHOS: 72 U/L (ref 38–126)
ALT: 15 U/L (ref 14–54)
AST: 20 U/L (ref 15–41)
Albumin: 3.9 g/dL (ref 3.5–5.0)
Anion gap: 10 (ref 5–15)
BILIRUBIN TOTAL: 0.6 mg/dL (ref 0.3–1.2)
BUN: 14 mg/dL (ref 6–20)
CALCIUM: 9.2 mg/dL (ref 8.9–10.3)
CO2: 24 mmol/L (ref 22–32)
CREATININE: 1.08 mg/dL — AB (ref 0.44–1.00)
Chloride: 106 mmol/L (ref 101–111)
GFR calc non Af Amer: 60 mL/min — ABNORMAL LOW (ref >60)
Glucose, Bld: 84 mg/dL (ref 65–99)
Potassium: 3.4 mmol/L — ABNORMAL LOW (ref 3.5–5.1)
Sodium: 140 mmol/L (ref 135–145)
Total Protein: 7.9 g/dL (ref 6.5–8.1)

## 2014-12-10 LAB — URINALYSIS, ROUTINE W REFLEX MICROSCOPIC
Bilirubin Urine: NEGATIVE
Glucose, UA: NEGATIVE mg/dL
HGB URINE DIPSTICK: NEGATIVE
KETONES UR: NEGATIVE mg/dL
Nitrite: NEGATIVE
Protein, ur: NEGATIVE mg/dL
Specific Gravity, Urine: 1.025 (ref 1.005–1.030)
Urobilinogen, UA: 0.2 mg/dL (ref 0.0–1.0)
pH: 6 (ref 5.0–8.0)

## 2014-12-10 LAB — CBC
HEMATOCRIT: 33.9 % — AB (ref 36.0–46.0)
Hemoglobin: 10.8 g/dL — ABNORMAL LOW (ref 12.0–15.0)
MCH: 24.9 pg — AB (ref 26.0–34.0)
MCHC: 31.9 g/dL (ref 30.0–36.0)
MCV: 78.3 fL (ref 78.0–100.0)
Platelets: 470 10*3/uL — ABNORMAL HIGH (ref 150–400)
RBC: 4.33 MIL/uL (ref 3.87–5.11)
RDW: 16.7 % — ABNORMAL HIGH (ref 11.5–15.5)
WBC: 11.9 10*3/uL — ABNORMAL HIGH (ref 4.0–10.5)

## 2014-12-10 LAB — HCG, QUANTITATIVE, PREGNANCY: hCG, Beta Chain, Quant, S: <1 m[IU]/mL

## 2014-12-10 LAB — URINE MICROSCOPIC-ADD ON

## 2014-12-14 ENCOUNTER — Other Ambulatory Visit: Payer: Self-pay | Admitting: Obstetrics & Gynecology

## 2014-12-15 ENCOUNTER — Ambulatory Visit (HOSPITAL_COMMUNITY)
Admission: RE | Admit: 2014-12-15 | Discharge: 2014-12-15 | Disposition: A | Discharge status: Home / Self Care | Payer: PRIVATE HEALTH INSURANCE | Source: Ambulatory Visit | Attending: Obstetrics & Gynecology | Admitting: Obstetrics & Gynecology

## 2014-12-15 ENCOUNTER — Encounter (HOSPITAL_COMMUNITY)
Admission: RE | Disposition: A | Discharge status: Home / Self Care | Payer: Self-pay | Source: Ambulatory Visit | Attending: Obstetrics & Gynecology

## 2014-12-15 ENCOUNTER — Ambulatory Visit (HOSPITAL_COMMUNITY): Payer: PRIVATE HEALTH INSURANCE | Admitting: Anesthesiology

## 2014-12-15 ENCOUNTER — Encounter (HOSPITAL_COMMUNITY): Payer: Self-pay | Admitting: *Deleted

## 2014-12-15 DIAGNOSIS — Z888 Allergy status to other drugs, medicaments and biological substances status: Secondary | ICD-10-CM | POA: Diagnosis not present

## 2014-12-15 DIAGNOSIS — N841 Polyp of cervix uteri: Secondary | ICD-10-CM | POA: Diagnosis not present

## 2014-12-15 DIAGNOSIS — E669 Obesity, unspecified: Secondary | ICD-10-CM | POA: Diagnosis not present

## 2014-12-15 DIAGNOSIS — K219 Gastro-esophageal reflux disease without esophagitis: Secondary | ICD-10-CM | POA: Diagnosis not present

## 2014-12-15 DIAGNOSIS — E785 Hyperlipidemia, unspecified: Secondary | ICD-10-CM | POA: Diagnosis not present

## 2014-12-15 DIAGNOSIS — R002 Palpitations: Secondary | ICD-10-CM | POA: Insufficient documentation

## 2014-12-15 DIAGNOSIS — D649 Anemia, unspecified: Secondary | ICD-10-CM | POA: Diagnosis not present

## 2014-12-15 DIAGNOSIS — N840 Polyp of corpus uteri: Secondary | ICD-10-CM | POA: Diagnosis not present

## 2014-12-15 DIAGNOSIS — G4730 Sleep apnea, unspecified: Secondary | ICD-10-CM | POA: Diagnosis not present

## 2014-12-15 DIAGNOSIS — Z7982 Long term (current) use of aspirin: Secondary | ICD-10-CM | POA: Insufficient documentation

## 2014-12-15 DIAGNOSIS — D250 Submucous leiomyoma of uterus: Secondary | ICD-10-CM | POA: Insufficient documentation

## 2014-12-15 DIAGNOSIS — F419 Anxiety disorder, unspecified: Secondary | ICD-10-CM | POA: Diagnosis not present

## 2014-12-15 DIAGNOSIS — N946 Dysmenorrhea, unspecified: Secondary | ICD-10-CM | POA: Insufficient documentation

## 2014-12-15 DIAGNOSIS — N920 Excessive and frequent menstruation with regular cycle: Secondary | ICD-10-CM | POA: Diagnosis not present

## 2014-12-15 DIAGNOSIS — Z6837 Body mass index (BMI) 37.0-37.9, adult: Secondary | ICD-10-CM | POA: Diagnosis not present

## 2014-12-15 DIAGNOSIS — N921 Excessive and frequent menstruation with irregular cycle: Secondary | ICD-10-CM | POA: Insufficient documentation

## 2014-12-15 DIAGNOSIS — I10 Essential (primary) hypertension: Secondary | ICD-10-CM | POA: Diagnosis not present

## 2014-12-15 HISTORY — PX: DILITATION & CURRETTAGE/HYSTROSCOPY WITH NOVASURE ABLATION: SHX5568

## 2014-12-15 HISTORY — PX: CERVICAL POLYPECTOMY: SHX88

## 2014-12-15 LAB — GLUCOSE, CAPILLARY
Glucose-Capillary: 92 mg/dL (ref 65–99)
Glucose-Capillary: 96 mg/dL (ref 65–99)

## 2014-12-15 SURGERY — DILATATION & CURETTAGE/HYSTEROSCOPY WITH NOVASURE ABLATION
Anesthesia: General

## 2014-12-15 MED ORDER — ROCURONIUM BROMIDE 100 MG/10ML IV SOLN
INTRAVENOUS | Status: DC | PRN
  Administered 2014-12-15: 6 mg via INTRAVENOUS

## 2014-12-15 MED ORDER — FENTANYL CITRATE (PF) 250 MCG/5ML IJ SOLN
INTRAMUSCULAR | Status: AC
  Filled 2014-12-15: qty 25

## 2014-12-15 MED ORDER — LIDOCAINE HCL (PF) 1 % IJ SOLN
INTRAMUSCULAR | Status: AC
  Filled 2014-12-15: qty 5

## 2014-12-15 MED ORDER — SUCCINYLCHOLINE CHLORIDE 20 MG/ML IJ SOLN
INTRAMUSCULAR | Status: AC
  Filled 2014-12-15: qty 1

## 2014-12-15 MED ORDER — PROPOFOL 10 MG/ML IV BOLUS
INTRAVENOUS | Status: AC
  Filled 2014-12-15: qty 20

## 2014-12-15 MED ORDER — ONDANSETRON HCL 4 MG/2ML IJ SOLN
INTRAMUSCULAR | Status: AC
  Filled 2014-12-15: qty 2

## 2014-12-15 MED ORDER — SUCCINYLCHOLINE CHLORIDE 20 MG/ML IJ SOLN
INTRAMUSCULAR | Status: DC | PRN
  Administered 2014-12-15: 110 mg via INTRAVENOUS

## 2014-12-15 MED ORDER — FENTANYL CITRATE (PF) 250 MCG/5ML IJ SOLN
INTRAMUSCULAR | Status: DC | PRN
  Administered 2014-12-15 (×2): 50 ug via INTRAVENOUS
  Administered 2014-12-15: 25 ug via INTRAVENOUS
  Administered 2014-12-15: 75 ug via INTRAVENOUS

## 2014-12-15 MED ORDER — KETOROLAC TROMETHAMINE 10 MG PO TABS: 10.0000 mg | ORAL_TABLET | Freq: Three times a day (TID) | ORAL | Status: DC | PRN

## 2014-12-15 MED ORDER — MIDAZOLAM HCL 5 MG/5ML IJ SOLN
INTRAMUSCULAR | Status: DC | PRN
  Administered 2014-12-15: 2 mg via INTRAVENOUS

## 2014-12-15 MED ORDER — ONDANSETRON HCL 4 MG/2ML IJ SOLN
4.0000 mg | Freq: Once | INTRAMUSCULAR | Status: AC
  Administered 2014-12-15: 4 mg via INTRAVENOUS

## 2014-12-15 MED ORDER — ONDANSETRON HCL 4 MG/2ML IJ SOLN: 4.0000 mg | Freq: Once | INTRAMUSCULAR | Status: DC | PRN

## 2014-12-15 MED ORDER — HYDROCODONE-ACETAMINOPHEN 5-325 MG PO TABS: 1.0000 | ORAL_TABLET | Freq: Four times a day (QID) | ORAL | Status: DC | PRN

## 2014-12-15 MED ORDER — PROPOFOL 10 MG/ML IV BOLUS
INTRAVENOUS | Status: DC | PRN
  Administered 2014-12-15: 170 mg via INTRAVENOUS

## 2014-12-15 MED ORDER — MIDAZOLAM HCL 2 MG/2ML IJ SOLN
INTRAMUSCULAR | Status: AC
  Filled 2014-12-15: qty 4

## 2014-12-15 MED ORDER — MIDAZOLAM HCL 2 MG/2ML IJ SOLN
INTRAMUSCULAR | Status: AC
  Filled 2014-12-15: qty 2

## 2014-12-15 MED ORDER — CEFAZOLIN SODIUM-DEXTROSE 2-3 GM-% IV SOLR
2.0000 g | INTRAVENOUS | Status: AC
  Administered 2014-12-15: 2 g via INTRAVENOUS

## 2014-12-15 MED ORDER — ROCURONIUM BROMIDE 50 MG/5ML IV SOLN
INTRAVENOUS | Status: AC
  Filled 2014-12-15: qty 1

## 2014-12-15 MED ORDER — FENTANYL CITRATE (PF) 100 MCG/2ML IJ SOLN: 25.0000 ug | INTRAMUSCULAR | Status: DC | PRN

## 2014-12-15 MED ORDER — KETOROLAC TROMETHAMINE 30 MG/ML IJ SOLN
30.0000 mg | Freq: Once | INTRAMUSCULAR | Status: AC
  Administered 2014-12-15: 30 mg via INTRAVENOUS
  Filled 2014-12-15: qty 1

## 2014-12-15 MED ORDER — GLYCOPYRROLATE 0.2 MG/ML IJ SOLN
0.2000 mg | Freq: Once | INTRAMUSCULAR | Status: AC
Start: 2014-12-15 — End: 2014-12-15
  Administered 2014-12-15: 0.2 mg via INTRAVENOUS

## 2014-12-15 MED ORDER — ONDANSETRON HCL 8 MG PO TABS: 8.0000 mg | ORAL_TABLET | Freq: Three times a day (TID) | ORAL | Status: DC | PRN

## 2014-12-15 MED ORDER — GLYCOPYRROLATE 0.2 MG/ML IJ SOLN
INTRAMUSCULAR | Status: AC
  Filled 2014-12-15: qty 1

## 2014-12-15 MED ORDER — LIDOCAINE HCL 1 % IJ SOLN
INTRAMUSCULAR | Status: DC | PRN
  Administered 2014-12-15: 30 mg via INTRADERMAL

## 2014-12-15 MED ORDER — CEFAZOLIN SODIUM-DEXTROSE 2-3 GM-% IV SOLR
INTRAVENOUS | Status: AC
Start: 2014-12-15 — End: 2014-12-15
  Filled 2014-12-15: qty 50

## 2014-12-15 MED ORDER — MIDAZOLAM HCL 2 MG/2ML IJ SOLN
1.0000 mg | INTRAMUSCULAR | Status: DC | PRN
  Administered 2014-12-15: 2 mg via INTRAVENOUS

## 2014-12-15 MED ORDER — SODIUM CHLORIDE 0.9 % IR SOLN
Status: DC | PRN
  Administered 2014-12-15: 3000 mL

## 2014-12-15 MED ORDER — LACTATED RINGERS IV SOLN
INTRAVENOUS | Status: DC
  Administered 2014-12-15: 07:00:00 via INTRAVENOUS

## 2014-12-15 SURGICAL SUPPLY — 39 items
ABLATOR ENDOMETRIAL BIPOLAR (ABLATOR) ×2 IMPLANT
APPLICATOR COTTON TIP 6IN STRL (MISCELLANEOUS) ×2 IMPLANT
BAG HAMPER (MISCELLANEOUS) ×2 IMPLANT
CLOTH BEACON ORANGE TIMEOUT ST (SAFETY) ×2 IMPLANT
COAGULATOR SUCT SWTCH 10FR 6 (ELECTROSURGICAL) IMPLANT
COVER LIGHT HANDLE STERIS (MISCELLANEOUS) ×4 IMPLANT
ELECT BLADE 6 FLAT ULTRCLN (ELECTRODE) ×2 IMPLANT
ELECT REM PT RETURN 9FT ADLT (ELECTROSURGICAL) ×2
ELECTRODE REM PT RTRN 9FT ADLT (ELECTROSURGICAL) ×1 IMPLANT
FORMALIN 10 PREFIL 120ML (MISCELLANEOUS) ×2 IMPLANT
GAUZE SPONGE 4X4 16PLY XRAY LF (GAUZE/BANDAGES/DRESSINGS) ×2 IMPLANT
GLOVE BIOGEL PI IND STRL 7.0 (GLOVE) ×1 IMPLANT
GLOVE BIOGEL PI IND STRL 8 (GLOVE) ×1 IMPLANT
GLOVE BIOGEL PI INDICATOR 7.0 (GLOVE) ×1
GLOVE BIOGEL PI INDICATOR 8 (GLOVE) ×1
GLOVE ECLIPSE 6.5 STRL STRAW (GLOVE) ×2 IMPLANT
GLOVE ECLIPSE 8.0 STRL XLNG CF (GLOVE) ×2 IMPLANT
GLOVE EXAM NITRILE MD LF STRL (GLOVE) ×2 IMPLANT
GOWN STRL REUS W/TWL LRG LVL3 (GOWN DISPOSABLE) ×2 IMPLANT
GOWN STRL REUS W/TWL XL LVL3 (GOWN DISPOSABLE) ×2 IMPLANT
INST SET HYSTEROSCOPY (KITS) ×2 IMPLANT
IV NS 1000ML (IV SOLUTION) ×1
IV NS 1000ML BAXH (IV SOLUTION) ×1 IMPLANT
KIT ROOM TURNOVER AP CYSTO (KITS) ×2 IMPLANT
KIT ROOM TURNOVER APOR (KITS) ×2 IMPLANT
LASER FIBER DISP 1000U (UROLOGICAL SUPPLIES) IMPLANT
MANIFOLD NEPTUNE II (INSTRUMENTS) ×2 IMPLANT
NS IRRIG 1000ML POUR BTL (IV SOLUTION) ×2 IMPLANT
PACK BASIC III (CUSTOM PROCEDURE TRAY)
PACK PERI GYN (CUSTOM PROCEDURE TRAY) ×2 IMPLANT
PACK SRG BSC III STRL LF ECLPS (CUSTOM PROCEDURE TRAY) IMPLANT
PAD ARMBOARD 7.5X6 YLW CONV (MISCELLANEOUS) ×2 IMPLANT
PAD TELFA 3X4 1S STER (GAUZE/BANDAGES/DRESSINGS) ×4 IMPLANT
PREFILTER SMOKE EVAC (FILTER) ×2 IMPLANT
SET BASIN LINEN APH (SET/KITS/TRAYS/PACK) ×2 IMPLANT
SET IRRIG Y TYPE TUR BLADDER L (SET/KITS/TRAYS/PACK) ×2 IMPLANT
TOWEL OR 17X26 4PK STRL BLUE (TOWEL DISPOSABLE) ×2 IMPLANT
TUBING SMOKE EVAC CO2 (TUBING) ×2 IMPLANT
WATER STERILE IRR 1000ML POUR (IV SOLUTION) ×2 IMPLANT

## 2014-12-15 NOTE — Anesthesia Procedure Notes (Signed)
Procedure Name: Intubation Date/Time: 12/15/2014 7:44 AM Performed by: Tressie Stalker E Pre-anesthesia Checklist: Patient identified, Patient being monitored, Timeout performed, Emergency Drugs available and Suction available Patient Re-evaluated:Patient Re-evaluated prior to inductionOxygen Delivery Method: Circle System Utilized Preoxygenation: Pre-oxygenation with 100% oxygen Intubation Type: IV induction, Rapid sequence and Cricoid Pressure applied Ventilation: Mask ventilation without difficulty Laryngoscope Size: Mac and 3 Grade View: Grade I Tube type: Oral Tube size: 7.0 mm Number of attempts: 1 Airway Equipment and Method: Stylet Placement Confirmation: ETT inserted through vocal cords under direct vision,  positive ETCO2 and breath sounds checked- equal and bilateral Secured at: 21 cm Tube secured with: Tape Dental Injury: Teeth and Oropharynx as per pre-operative assessment

## 2014-12-15 NOTE — Transfer of Care (Signed)
Immediate Anesthesia Transfer of Care Note  Patient: Summer Carpenter  Procedure(s) Performed: Procedure(s): DILATATION & CURETTAGE/HYSTEROSCOPY WITH NOVASURE ABLATION (N/A) ENDOMETRIAL POLYPECTOMY (N/A)  Patient Location: PACU  Anesthesia Type:General  Level of Consciousness: awake  Airway & Oxygen Therapy: Patient Spontanous Breathing and Patient connected to face mask oxygen  Post-op Assessment: Report given to RN  Post vital signs: Reviewed and stable  Last Vitals:  Filed Vitals:   12/15/14 0730  BP:   Pulse:   Temp:   Resp: 31    Complications: No apparent anesthesia complications

## 2014-12-15 NOTE — Discharge Instructions (Signed)
Endometrial Ablation Endometrial ablation removes the lining of the uterus (endometrium). It is usually a same-day, outpatient treatment. Ablation helps avoid major surgery, such as surgery to remove the cervix and uterus (hysterectomy). After endometrial ablation, you will have little or no menstrual bleeding and may not be able to have children. However, if you are premenopausal, you will need to use a reliable method of birth control following the procedure because of the small chance that pregnancy can occur. There are different reasons to have this procedure, which include:  Heavy periods.  Bleeding that is causing anemia.  Irregular bleeding.  Bleeding fibroids on the lining inside the uterus if they are smaller than 3 centimeters. This procedure should not be done if:  You want children in the future.  You have severe cramps with your menstrual period.  You have precancerous or cancerous cells in your uterus.  You were recently pregnant.  You have gone through menopause.  You have had major surgery on the uterus, such as a cesarean delivery. LET YOUR HEALTH CARE PROVIDER KNOW ABOUT:  Any allergies you have.  All medicines you are taking, including vitamins, herbs, eye drops, creams, and over-the-counter medicines.  Previous problems you or members of your family have had with the use of anesthetics.  Any blood disorders you have.  Previous surgeries you have had.  Medical conditions you have. RISKS AND COMPLICATIONS  Generally, this is a safe procedure. However, as with any procedure, complications can occur. Possible complications include:  Perforation of the uterus.  Bleeding.  Infection of the uterus, bladder, or vagina.  Injury to surrounding organs.  An air bubble to the lung (air embolus).  Pregnancy following the procedure.  Failure of the procedure to help the problem, requiring hysterectomy.  Decreased ability to diagnose cancer in the lining of  the uterus. BEFORE THE PROCEDURE  The lining of the uterus must be tested to make sure there is no pre-cancerous or cancer cells present.  An ultrasound may be performed to look at the size of the uterus and to check for abnormalities.  Medicines may be given to thin the lining of the uterus. PROCEDURE  During the procedure, your health care provider will use a tool called a resectoscope to help see inside your uterus. There are different ways to remove the lining of your uterus.   Radiofrequency - This method uses a radiofrequency-alternating electric current to remove the lining of the uterus.  Cryotherapy - This method uses extreme cold to freeze the lining of the uterus.  Heated-Free Liquid - This method uses heated salt (saline) solution to remove the lining of the uterus.  Microwave - This method uses high-energy microwaves to heat up the lining of the uterus to remove it.  Thermal balloon - This method involves inserting a catheter with a balloon tip into the uterus. The balloon tip is filled with heated fluid to remove the lining of the uterus. AFTER THE PROCEDURE  After your procedure, do not have sexual intercourse or insert anything into your vagina until permitted by your health care provider. After the procedure, you may experience:  Cramps.  Vaginal discharge.  Frequent urination. Document Released: 02/03/2004 Document Revised: 11/26/2012 Document Reviewed: 08/27/2012 [employer] Patient Information 2015 [employer]. This information is not intended to replace advice given to you by your health care provider. Make sure you discuss any questions you have with your health care provider.  

## 2014-12-15 NOTE — Anesthesia Postprocedure Evaluation (Signed)
  Anesthesia Post-op Note  Patient: Summer Carpenter  Procedure(s) Performed: Procedure(s): DILATATION & CURETTAGE/HYSTEROSCOPY WITH ENDOMETRIAL NOVASURE ABLATION (N/A) ENDOMETRIAL POLYPECTOMY (N/A)  Patient Location: PACU  Anesthesia Type:General  Level of Consciousness: awake, alert  and oriented  Airway and Oxygen Therapy: Patient Spontanous Breathing  Post-op Pain: none  Post-op Assessment: Post-op Vital signs reviewed, Patient's Cardiovascular Status Stable, Respiratory Function Stable, Patent Airway and No signs of Nausea or vomiting              Post-op Vital Signs: Reviewed and stable  Last Vitals:  Filed Vitals:   12/15/14 0900  BP:   Pulse: 89  Temp:   Resp: 15    Complications: No apparent anesthesia complications

## 2014-12-15 NOTE — Interval H&P Note (Signed)
History and Physical Interval Note:  12/15/2014 7:21 AM  Summer Carpenter  has presented today for surgery, with the diagnosis of menorrhagia, fibroids  The various methods of treatment have been discussed with the patient and family. After consideration of risks, benefits and other options for treatment, the patient has consented to  Procedure(s): DILATATION & CURETTAGE/HYSTEROSCOPY WITH NOVASURE ABLATION (N/A) HOLMIUM LASER APPLICATION/REMOVAL FIBROID (N/A) as a surgical intervention .  The patient's history has been reviewed, patient examined, no change in status, stable for surgery.  I have reviewed the patient's chart and labs.  Questions were answered to the patient's satisfaction.     [name]

## 2014-12-15 NOTE — H&P (Signed)
Preoperative History and Physical  Summer Carpenter is a 49 y.o. [phone] with Patient's last menstrual period was 09/28/2014. admitted for hysteroscopy uterine curettage with removal of a submucosal myoma found today and sonogram and endometrial ablation  Patient is been having increasing difficulty with heaviness of vaginal bleeding clots and severe cramping is taken a dramatic increase in volume and pain over the last 6-12 months Sonogram performed in the office reveals a submucosal myoma versus polyp favor myoma which is most likely responsible for the change in the patient's menstrual problems  PMH:  Past Medical History  Diagnosis Date  . Hypertension   . Anxiety   . Hyperlipidemia   . GERD (gastroesophageal reflux disease)   . Prediabetes   . Palpitations   . Obesity   . Sleep apnea   . Sleep apnea     PSH:  Past Surgical History  Procedure Laterality Date  . Cholecystectomy    . Doppler echocardiography  07/30/2000 [place]    EF 55-66%,lv normal  . Nm myocar perf wall motion  10/22/2011  . Cardiac catheterization  11/25/2001    norm. LV fx,norm. abd aorta, both renal arteries patent  . Oophorectomy  2008    lt    POb/GynH:  OB History    Gravida Para Term Preterm AB TAB SAB Ectopic Multiple Living   6 3 3  3  3          SH:  History  Substance Use Topics  . Smoking status: Never Smoker   . Smokeless tobacco: Never Used  . Alcohol Use: Yes     Comment: Occasionally, once a month    FH:  Family History  Problem Relation Age of Onset  . Hypertension Mother   . Heart attack Mother   . Seizures Father   . Hypertension Brother   . Hypertension Brother   . Hypertension Sister   . Early death Brother   . Hypertension Son      Allergies:  Allergies  Allergen Reactions  . Septra [Bactrim]  Hives and Diarrhea    Medications:  Current outpatient prescriptions:  . aspirin EC 81 MG tablet, Take 81 mg by mouth daily. , Disp: , Rfl:  . hydrochlorothiazide (HYDRODIURIL) 25 MG tablet, Take 12.5 mg by mouth daily., Disp: , Rfl:  . megestrol (MEGACE) 40 MG tablet, Take 1 tablet (40 mg total) by mouth daily., Disp: 30 tablet, Rfl: 1 . omeprazole (PRILOSEC) 20 MG capsule, Take 20 mg by mouth daily., Disp: , Rfl:  . propranolol ER (INDERAL LA) 120 MG 24 hr capsule, Take 1 capsule (120 mg total) by mouth 2 (two) times daily., Disp: 180 capsule, Rfl: 1 . sertraline (ZOLOFT) 50 MG tablet, Take 50 mg by mouth daily., Disp: , Rfl:  . simvastatin (ZOCOR) 10 MG tablet, Take 10 mg by mouth at bedtime. , Disp: , Rfl:  . valACYclovir (VALTREX) 500 MG tablet, Take one tablet by mouth once daily, Disp: 30 tablet, Rfl: 6 . propranolol ER (INDERAL LA) 120 MG 24 hr capsule, TAKE 2 CAPSULES (240 MG TOTAL) BY MOUTH DAILY. (Patient not taking: Reported on 11/02/2014), Disp: 180 capsule, Rfl: 1 . spironolactone (ALDACTONE) 25 MG tablet, Take 1 tablet (25 mg total) by mouth daily. (Patient not taking: Reported on 11/02/2014), Disp: 90 tablet, Rfl: 3  Review of Systems:   Review of Systems  Constitutional: Negative for fever, chills, weight loss, malaise/fatigue and diaphoresis.  HENT: Negative for hearing loss, ear pain, nosebleeds, congestion, sore throat, neck  pain, tinnitus and ear discharge.  Eyes: Negative for blurred vision, double vision, photophobia, pain, discharge and redness.  Respiratory: Negative for cough, hemoptysis, sputum production, shortness of breath, wheezing and stridor.  Cardiovascular: Negative for chest pain, palpitations, orthopnea, claudication, leg swelling and PND.  Gastrointestinal: Positive for abdominal pain. Negative for heartburn, nausea, vomiting, diarrhea, constipation, blood in stool and melena.  Genitourinary: Negative for dysuria, urgency,  frequency, hematuria and flank pain.  Musculoskeletal: Negative for myalgias, back pain, joint pain and falls.  Skin: Negative for itching and rash.  Neurological: Negative for dizziness, tingling, tremors, sensory change, speech change, focal weakness, seizures, loss of consciousness, weakness and headaches.  Endo/Heme/Allergies: Negative for environmental allergies and polydipsia. Does not bruise/bleed easily.  Psychiatric/Behavioral: Negative for depression, suicidal ideas, hallucinations, memory loss and substance abuse. The patient is not nervous/anxious and does not have insomnia.     PHYSICAL EXAM:  Blood pressure 120/70, pulse 72, weight 238 lb (107.956 kg), last menstrual period 09/28/2014.   Vitals reviewed. Constitutional: She is oriented to person, place, and time. She appears well-developed and well-nourished.  HENT:  Head: Normocephalic and atraumatic.  Right Ear: External ear normal.  Left Ear: External ear normal.  Nose: Nose normal.  Mouth/Throat: Oropharynx is clear and moist.  Eyes: Conjunctivae and EOM are normal. Pupils are equal, round, and reactive to light. Right eye exhibits no discharge. Left eye exhibits no discharge. No scleral icterus.  Neck: Normal range of motion. Neck supple. No tracheal deviation present. No thyromegaly present.  Cardiovascular: Normal rate, regular rhythm, normal heart sounds and intact distal pulses. Exam reveals no gallop and no friction rub.  No murmur heard. Respiratory: Effort normal and breath sounds normal. No respiratory distress. She has no wheezes. She has no rales. She exhibits no tenderness.  GI: Soft. Bowel sounds are normal. She exhibits no distension and no mass. There is tenderness. There is no rebound and no guarding.  Genitourinary:   Vulva is normal without lesions Vagina is pink moist without discharge Cervix normal in appearance and pap is normal Uterus is normal size, contour, position, consistency,  mobility, non-tender Adnexa is negative with normal sized ovaries by sonogram  Musculoskeletal: Normal range of motion. She exhibits no edema and no tenderness.  Neurological: She is alert and oriented to person, place, and time. She has normal reflexes. She displays normal reflexes. No cranial nerve deficit. She exhibits normal muscle tone. Coordination normal.  Skin: Skin is warm and dry. No rash noted. No erythema. No pallor.  Psychiatric: She has a normal mood and affect. Her behavior is normal. Judgment and thought content normal.    Labs: No results found for this or any previous visit (from the past 336 hour(s)).  EKG: Orders placed or performed during the hospital encounter of 08/07/14  . ED EKG (<23mins upon arrival to the ED)  . ED EKG (<57mins upon arrival to the ED)  . EKG 12-Lead  . EKG 12-Lead  . EKG    Imaging Studies:  Imaging Results    US Transvaginal Non-ob  11/02/2014 GYNECOLOGIC SONOGRAM ANDRINA LOCKEN is a 49 y.o. [id number] LMP 09/28/2014 for a pelvic sonogram for menorrhagia/dysmenorrhea. Uterus 9.7 x 6.6 x 6.2 cm, anteverted heterogenous uterus w/mult fibroids,largest fibroids (#1)subserosal post mid uterus 2.6x 2.2 x 1.3cm,(#2)intramural mid uterus 2.2 x 2.1 x 2.8cm,(#3) submucosal fibroid mid ut 1.8 x 1.2 x 1.6cm deforming endometrial cavity, Endometrium 23 mm, asymmetrical,submucosal fibroid deforming endometrial cavity Right ovary 3.3 x 3.3 x 1.8 cm,  normal Left ovary Lt oophorectomy Technician Comments: PELVIC US TA/TV: anteverted heterogenous uterus w/mult fibroids,largest fibroids (#1)subserosal post mid uterus 2.6x 2.2 x 1.3cm,(#2)intramural mid uterus 2.2 x 2.1 x 2.8cm,(#3) submucosal fibroid mid ut 1.8 x 1.2 x 1.6cm deforming endometrial cavity, thick eec 60mm,normal rt ov,lt oophorectomy Silver Huguenin 11/02/2014 9:49 AM   US  Pelvis Complete  11/02/2014 GYNECOLOGIC SONOGRAM SHAKIARA LUKIC is a 49 y.o. [id number] LMP 09/28/2014 for a pelvic sonogram for menorrhagia/dysmenorrhea. Uterus 9.7 x 6.6 x 6.2 cm, anteverted heterogenous uterus w/mult fibroids,largest fibroids (#1)subserosal post mid uterus 2.6x 2.2 x 1.3cm,(#2)intramural mid uterus 2.2 x 2.1 x 2.8cm,(#3) submucosal fibroid mid ut 1.8 x 1.2 x 1.6cm deforming endometrial cavity, Endometrium 23 mm, asymmetrical,submucosal fibroid deforming endometrial cavity Right ovary 3.3 x 3.3 x 1.8 cm, normal Left ovary Lt oophorectomy Technician Comments: PELVIC US TA/TV: anteverted heterogenous uterus w/mult fibroids,largest fibroids (#1)subserosal post mid uterus 2.6x 2.2 x 1.3cm,(#2)intramural mid uterus 2.2 x 2.1 x 2.8cm,(#3) submucosal fibroid mid ut 1.8 x 1.2 x 1.6cm deforming endometrial cavity, thick eec 8mm,normal rt ov,lt oophorectomy Silver Huguenin 11/02/2014 9:49 AM       Assessment: Patient Active Problem List   Diagnosis Date Noted  . Encounter for other contraceptive management 02/04/2014  . Hypokalemia 08/28/2013  . Anemia 08/28/2013  . OSA (obstructive sleep apnea) 12/22/2012  . Fatigue 10/08/2012  . Snoring 10/08/2012  . Palpitations 09/14/2012  . Essential hypertension 09/14/2012  . Hyperlipidemia 09/14/2012  . Obesity (BMI 35.0-39.9 without comorbidity) 09/14/2012    Plan: Hysteroscopy D&C endometrial ablation with removal of submucosal myoma conventionally or with holmium laser In the meantime the patient be maintained a megestrol therapy to suppress her bleeding  EURE,LUTHER H 12/15/2014 7:21 AM

## 2014-12-15 NOTE — Anesthesia Preprocedure Evaluation (Signed)
Anesthesia Evaluation  Patient identified by MRN, date of birth, ID band Patient awake    Reviewed: Allergy & Precautions, NPO status , Patient's Chart, lab work & pertinent test results, reviewed documented beta blocker date and time   Airway Mallampati: I  TM Distance: >3 FB     Dental  (+) Dental Advisory Given, Teeth Intact   Pulmonary sleep apnea and Continuous Positive Airway Pressure Ventilation ,    breath sounds clear to auscultation       Cardiovascular hypertension, Pt. on medications and Pt. on home beta blockers  Rhythm:Regular Rate:Normal     Neuro/Psych PSYCHIATRIC DISORDERS Anxiety    GI/Hepatic GERD  Controlled and Medicated,  Endo/Other  diabetes (borderline)  Renal/GU      Musculoskeletal   Abdominal   Peds  Hematology  (+) anemia ,   Anesthesia Other Findings   Reproductive/Obstetrics                             Anesthesia Physical Anesthesia Plan  ASA: III  Anesthesia Plan: General   Post-op Pain Management:    Induction: Intravenous, Rapid sequence and Cricoid pressure planned  Airway Management Planned: Oral ETT  Additional Equipment:   Intra-op Plan:   Post-operative Plan: Extubation in OR  Informed Consent: I have reviewed the patients History and Physical, chart, labs and discussed the procedure including the risks, benefits and alternatives for the proposed anesthesia with the patient or authorized representative who has indicated his/her understanding and acceptance.     Plan Discussed with:   Anesthesia Plan Comments:         Anesthesia Quick Evaluation

## 2014-12-15 NOTE — Progress Notes (Signed)
Awake. Denies pain. Water given to drink. Tolerated well. 

## 2014-12-15 NOTE — Op Note (Signed)
Preoperative diagnosis: Menometrorrhagia                                        Dysmenorrhea                                        Endometrial polyp vs fibroid  Postoperative diagnoses: Same as above endometrial polyp  Procedure: Hysteroscopy, uterine curettage, removal of endometrial polypendometrial ablation using Novasure  Surgeon: Florian Buff   Anesthesia: Laryngeal mask airway  Findings: The endometrium was significant for  endometrial polyp. There were no fibroid or other abnormalities.  Description of operation: The patient was taken to the operating room and placed in the supine position. She underwent general anesthesia using the laryngeal mask airway. She was placed in the dorsal lithotomy position and prepped and draped in the usual sterile fashion. A Graves speculum was placed and the anterior cervical lip was grasped with a single-tooth tenaculum. The cervix was dilated serially to allow passage of the hysteroscope. Diagnostic hysteroscopy was performed and was found to be normal. A vigorous uterine curettage was then performed and all tissue sent to pathology for evaluation.  I then proceeded to perform the Novasure endometrial ablation.  The cervical length was 3.0. The uterus sounded to 8.0  cm yielding a net length of 5.0 cm.  The endometrial cavity was 4.5 cm wide. The power was 124 watts.  The total time of therapy was 0 min 58 seconds. The array was evaluated after the procedure and tissue was adherent on all the dimensions of the surface, confirming fundal treatment as well.    All of the equipment worked well throughout the procedure.  The patient was awakened from anesthesia and taken to the recovery room in good stable condition all counts were correct. She received 2 g of Ancef and 30 mg of Toradol preoperatively. She will be discharged from the recovery room and followed up in the office in 1- 2 weeks.  Shaman Muscarella H 12/15/2014 8:26 AM

## 2014-12-16 ENCOUNTER — Encounter (HOSPITAL_COMMUNITY): Payer: Self-pay | Admitting: Obstetrics & Gynecology

## 2014-12-22 ENCOUNTER — Encounter: Payer: Self-pay | Admitting: Obstetrics & Gynecology

## 2014-12-22 ENCOUNTER — Ambulatory Visit (INDEPENDENT_AMBULATORY_CARE_PROVIDER_SITE_OTHER): Payer: PRIVATE HEALTH INSURANCE | Admitting: Obstetrics & Gynecology

## 2014-12-22 VITALS — BP 102/70 | HR 72 | Wt 242.0 lb

## 2014-12-22 DIAGNOSIS — Z9889 Other specified postprocedural states: Secondary | ICD-10-CM

## 2014-12-22 NOTE — Progress Notes (Signed)
[name], female   DOB: 1965-04-24, 49 y.o.   MRN: [id number]  HPI: Patient returns for routine postoperative follow-up having undergone hysteroscopy D&C NovaSure ablation on 12/15/2014.  The patient's immediate postoperative recovery has been unremarkable. Since hospital discharge the patient reports no problems.   Current Outpatient Prescriptions: aspirin EC 81 MG tablet, Take 81 mg by mouth daily.  , Disp: , Rfl:  hydrochlorothiazide (HYDRODIURIL) 25 MG tablet, Take 12.5 mg by mouth daily., Disp: , Rfl:  omeprazole (PRILOSEC) 20 MG capsule, Take 20 mg by mouth daily., Disp: , Rfl:  ondansetron (ZOFRAN) 8 MG tablet, Take 1 tablet (8 mg total) by mouth every 8 (eight) hours as needed for nausea., Disp: 12 tablet, Rfl: 0 potassium chloride (K-DUR) 10 MEQ tablet, Take 1 tablet (10 mEq total) by mouth daily., Disp: 90 tablet, Rfl: 1 propranolol ER (INDERAL LA) 120 MG 24 hr capsule, TAKE 2 CAPSULES (240 MG TOTAL) BY MOUTH DAILY., Disp: 180 capsule, Rfl: 1 sertraline (ZOLOFT) 50 MG tablet, Take 50 mg by mouth daily., Disp: , Rfl:  simvastatin (ZOCOR) 10 MG tablet, Take 10 mg by mouth at bedtime. , Disp: , Rfl:  HYDROcodone-acetaminophen (NORCO/VICODIN) 5-325 MG per tablet, Take 1 tablet by mouth every 6 (six) hours as needed. (Patient not taking: Reported on 12/22/2014), Disp: 30 tablet, Rfl: 0 ketorolac (TORADOL) 10 MG tablet, Take 1 tablet (10 mg total) by mouth every 8 (eight) hours as needed. (Patient not taking: Reported on 12/22/2014), Disp: 15 tablet, Rfl: 0 polyethylene glycol powder (GLYCOLAX/MIRALAX) powder, Take 255 g by mouth once. Take one cap full daily daily dissolved in liquid.  If no effect in 3 days, take two capfuls daily. (Patient not taking: Reported on 12/22/2014), Disp: 500 g, Rfl: 0 spironolactone (ALDACTONE) 25 MG tablet, Take 1 tablet (25 mg total) by mouth daily. (Patient not taking: Reported on 11/13/2014), Disp: 90 tablet, Rfl: 3 valACYclovir (VALTREX)  500 MG tablet, Take one tablet by mouth once daily (Patient not taking: Reported on 12/22/2014), Disp: 30 tablet, Rfl: 6  No current facility-administered medications for this visit.    Blood pressure 102/70, pulse 72, weight 242 lb (109.77 kg).  Physical Exam: Normal post op pelvic  Diagnostic Tests: none  Pathology: Benign, pt also read  Impression: Normal post ablation course, benign pathology  Plan: Follow up 1 year  Follow up: 1  years  Florian Buff, MD

## 2015-01-13 ENCOUNTER — Emergency Department (HOSPITAL_COMMUNITY)
Admission: EM | Admit: 2015-01-13 | Discharge: 2015-01-13 | Disposition: A | Discharge status: Home / Self Care | Payer: PRIVATE HEALTH INSURANCE | Attending: Emergency Medicine | Admitting: Emergency Medicine

## 2015-01-13 ENCOUNTER — Emergency Department (HOSPITAL_COMMUNITY): Payer: PRIVATE HEALTH INSURANCE

## 2015-01-13 ENCOUNTER — Encounter (HOSPITAL_COMMUNITY): Payer: Self-pay | Admitting: *Deleted

## 2015-01-13 DIAGNOSIS — R110 Nausea: Secondary | ICD-10-CM | POA: Diagnosis not present

## 2015-01-13 DIAGNOSIS — R140 Abdominal distension (gaseous): Secondary | ICD-10-CM | POA: Diagnosis not present

## 2015-01-13 DIAGNOSIS — Z8669 Personal history of other diseases of the nervous system and sense organs: Secondary | ICD-10-CM | POA: Diagnosis not present

## 2015-01-13 DIAGNOSIS — K219 Gastro-esophageal reflux disease without esophagitis: Secondary | ICD-10-CM | POA: Insufficient documentation

## 2015-01-13 DIAGNOSIS — Z79899 Other long term (current) drug therapy: Secondary | ICD-10-CM | POA: Diagnosis not present

## 2015-01-13 DIAGNOSIS — F419 Anxiety disorder, unspecified: Secondary | ICD-10-CM | POA: Diagnosis not present

## 2015-01-13 DIAGNOSIS — R142 Eructation: Secondary | ICD-10-CM | POA: Diagnosis not present

## 2015-01-13 DIAGNOSIS — Z3202 Encounter for pregnancy test, result negative: Secondary | ICD-10-CM | POA: Insufficient documentation

## 2015-01-13 DIAGNOSIS — Z7982 Long term (current) use of aspirin: Secondary | ICD-10-CM | POA: Diagnosis not present

## 2015-01-13 DIAGNOSIS — E669 Obesity, unspecified: Secondary | ICD-10-CM | POA: Diagnosis not present

## 2015-01-13 DIAGNOSIS — I10 Essential (primary) hypertension: Secondary | ICD-10-CM | POA: Diagnosis not present

## 2015-01-13 DIAGNOSIS — R109 Unspecified abdominal pain: Secondary | ICD-10-CM

## 2015-01-13 DIAGNOSIS — E785 Hyperlipidemia, unspecified: Secondary | ICD-10-CM | POA: Insufficient documentation

## 2015-01-13 DIAGNOSIS — Z9049 Acquired absence of other specified parts of digestive tract: Secondary | ICD-10-CM | POA: Insufficient documentation

## 2015-01-13 LAB — COMPREHENSIVE METABOLIC PANEL
ALK PHOS: 71 U/L (ref 38–126)
ALT: 22 U/L (ref 14–54)
ANION GAP: 9 (ref 5–15)
AST: 23 U/L (ref 15–41)
Albumin: 4.1 g/dL (ref 3.5–5.0)
BUN: 9 mg/dL (ref 6–20)
CALCIUM: 8.9 mg/dL (ref 8.9–10.3)
CO2: 27 mmol/L (ref 22–32)
Chloride: 100 mmol/L — ABNORMAL LOW (ref 101–111)
Creatinine, Ser: 1.06 mg/dL — ABNORMAL HIGH (ref 0.44–1.00)
GFR calc non Af Amer: >60 mL/min (ref >60)
Glucose, Bld: 104 mg/dL — ABNORMAL HIGH (ref 65–99)
Potassium: 3.4 mmol/L — ABNORMAL LOW (ref 3.5–5.1)
SODIUM: 136 mmol/L (ref 135–145)
TOTAL PROTEIN: 8 g/dL (ref 6.5–8.1)
Total Bilirubin: 0.6 mg/dL (ref 0.3–1.2)

## 2015-01-13 LAB — URINALYSIS, ROUTINE W REFLEX MICROSCOPIC
Bilirubin Urine: NEGATIVE
Glucose, UA: NEGATIVE mg/dL
Hgb urine dipstick: NEGATIVE
KETONES UR: NEGATIVE mg/dL
LEUKOCYTES UA: NEGATIVE
NITRITE: NEGATIVE
PROTEIN: NEGATIVE mg/dL
Specific Gravity, Urine: 1.02 (ref 1.005–1.030)
Urobilinogen, UA: 0.2 mg/dL (ref 0.0–1.0)
pH: 7 (ref 5.0–8.0)

## 2015-01-13 LAB — CBC WITH DIFFERENTIAL/PLATELET
BASOS PCT: 0 %
Basophils Absolute: 0 10*3/uL (ref 0.0–0.1)
EOS ABS: 0.3 10*3/uL (ref 0.0–0.7)
Eosinophils Relative: 3 %
HCT: 34 % — ABNORMAL LOW (ref 36.0–46.0)
HEMOGLOBIN: 10.6 g/dL — AB (ref 12.0–15.0)
LYMPHS ABS: 3.3 10*3/uL (ref 0.7–4.0)
Lymphocytes Relative: 32 %
MCH: 24.5 pg — ABNORMAL LOW (ref 26.0–34.0)
MCHC: 31.2 g/dL (ref 30.0–36.0)
MCV: 78.7 fL (ref 78.0–100.0)
Monocytes Absolute: 0.7 10*3/uL (ref 0.1–1.0)
Monocytes Relative: 6 %
NEUTROS PCT: 59 %
Neutro Abs: 6.1 10*3/uL (ref 1.7–7.7)
Platelets: 406 10*3/uL — ABNORMAL HIGH (ref 150–400)
RBC: 4.32 MIL/uL (ref 3.87–5.11)
RDW: 17.2 % — ABNORMAL HIGH (ref 11.5–15.5)
WBC: 10.5 10*3/uL (ref 4.0–10.5)

## 2015-01-13 LAB — PREGNANCY, URINE: PREG TEST UR: NEGATIVE

## 2015-01-13 LAB — LIPASE, BLOOD: Lipase: 32 U/L (ref 22–51)

## 2015-01-13 MED ORDER — ONDANSETRON 8 MG PO TBDP
8.0000 mg | ORAL_TABLET | Freq: Once | ORAL | Status: AC
  Administered 2015-01-13: 8 mg via ORAL
  Filled 2015-01-13: qty 1

## 2015-01-13 MED ORDER — DICYCLOMINE HCL 20 MG PO TABS: 20.0000 mg | ORAL_TABLET | Freq: Four times a day (QID) | ORAL | Status: DC | PRN

## 2015-01-13 MED ORDER — ONDANSETRON HCL 4 MG PO TABS: 4.0000 mg | ORAL_TABLET | Freq: Three times a day (TID) | ORAL | Status: DC | PRN

## 2015-01-13 MED ORDER — DICYCLOMINE HCL 10 MG/ML IM SOLN
20.0000 mg | Freq: Once | INTRAMUSCULAR | Status: AC
  Administered 2015-01-13: 20 mg via INTRAMUSCULAR
  Filled 2015-01-13: qty 2

## 2015-01-13 NOTE — ED Notes (Signed)
Patient reports abd cramping and lots of nausea/belching. Reports hx of gastritis. Has been taking carafate and zorfran without relief.

## 2015-01-13 NOTE — ED Provider Notes (Signed)
CSN: [id number]     Arrival date & time 01/13/15  1656 History   First MD Initiated Contact with Patient 01/13/15 1711     Chief Complaint  Patient presents with  . Abdominal Cramping      HPI Pt was seen at 1715.  Per pt, c/o gradual onset and persistence of constant nausea for the past 2 weeks. Has been associated with abd "bloating," "cramping" and "burning." Has been associated with "burping a lot." Pt describes her symptoms as "like when they told me I had gastritis." Pt has was not taking her carafate or prilosec regularly, but started again 2 weeks ago. Denies vomiting/diarrhea, no abd pain, no CP/SOB, no back pain, no fevers, no black or blood in stools, no dysuria/hematuria.      Past Medical History  Diagnosis Date  . Hypertension   . Anxiety   . Hyperlipidemia   . GERD (gastroesophageal reflux disease)   . Prediabetes   . Palpitations   . Obesity   . Sleep apnea   . Sleep apnea    Past Surgical History  Procedure Laterality Date  . Cholecystectomy    . Doppler echocardiography  07/30/2000 [place]    EF 55-66%,lv normal  . Nm myocar perf wall motion  10/22/2011  . Cardiac catheterization  11/25/2001    norm. LV fx,norm. abd aorta, both renal arteries patent  . Oophorectomy  2008    lt  . Dilitation & currettage/hystroscopy with novasure ablation N/A 12/15/2014    Procedure: DILATATION & CURETTAGE/HYSTEROSCOPY WITH ENDOMETRIAL NOVASURE ABLATION;  Surgeon: Florian Buff, MD;  Location: [facility];  Service: Gynecology;  Laterality: N/A;  . Cervical polypectomy N/A 12/15/2014    Procedure: ENDOMETRIAL POLYPECTOMY;  Surgeon: Florian Buff, MD;  Location: [facility];  Service: Gynecology;  Laterality: N/A;   Family History  Problem Relation Age of Onset  . Hypertension Mother   . Heart attack Mother   . Seizures Father   . Hypertension Brother   . Hypertension Brother   . Hypertension Sister   . Early death Brother   . Hypertension Son    Social History  Substance  Use Topics  . Smoking status: Never Smoker   . Smokeless tobacco: Never Used  . Alcohol Use: Yes     Comment: Occasionally, once a month   OB History    Gravida Para Term Preterm AB TAB SAB Ectopic Multiple Living   6 3 3  3  3         Review of Systems ROS: Statement: All systems negative except as marked or noted in the HPI; Constitutional: Negative for fever and chills. ; ; Eyes: Negative for eye pain, redness and discharge. ; ; ENMT: Negative for ear pain, hoarseness, nasal congestion, sinus pressure and sore throat. ; ; Cardiovascular: Negative for chest pain, palpitations, diaphoresis, dyspnea and peripheral edema. ; ; Respiratory: Negative for cough, wheezing and stridor. ; ; Gastrointestinal: +nausea, abd "cramping." Negative for vomiting, diarrhea, abdominal pain, blood in stool, hematemesis, jaundice and rectal bleeding. . ; ; Genitourinary: Negative for dysuria, flank pain and hematuria. ; ; Musculoskeletal: Negative for back pain and neck pain. Negative for swelling and trauma.; ; Skin: Negative for pruritus, rash, abrasions, blisters, bruising and skin lesion.; ; Neuro: Negative for headache, lightheadedness and neck stiffness. Negative for weakness, altered level of consciousness , altered mental status, extremity weakness, paresthesias, involuntary movement, seizure and syncope.      Allergies  Septra  Home Medications   Prior  to Admission medications   Medication Sig Start Date End Date Taking? Authorizing Provider  aspirin EC 81 MG tablet Take 81 mg by mouth daily.     Yes Historical Provider, MD  hydrochlorothiazide (HYDRODIURIL) 25 MG tablet Take 12.5 mg by mouth daily.   Yes Historical Provider, MD  omeprazole (PRILOSEC) 40 MG capsule Take 40 mg by mouth daily.  01/13/15  Yes Historical Provider, MD  ondansetron (ZOFRAN) 8 MG tablet Take 1 tablet (8 mg total) by mouth every 8 (eight) hours as needed for nausea. 12/15/14  Yes Florian Buff, MD  potassium chloride (K-DUR) 10  MEQ tablet Take 1 tablet (10 mEq total) by mouth daily. 11/09/14  Yes Mihai Croitoru, MD  propranolol ER (INDERAL LA) 120 MG 24 hr capsule TAKE 2 CAPSULES (240 MG TOTAL) BY MOUTH DAILY. 10/25/14  Yes Mihai Croitoru, MD  sertraline (ZOLOFT) 50 MG tablet Take 50 mg by mouth daily.   Yes Historical Provider, MD  simvastatin (ZOCOR) 10 MG tablet Take 10 mg by mouth at bedtime.    Yes Historical Provider, MD  sucralfate (CARAFATE) 1 G tablet Take 1 tablet by mouth 3 (three) times daily before meals. 01/08/15  Yes Historical Provider, MD  HYDROcodone-acetaminophen (NORCO/VICODIN) 5-325 MG per tablet Take 1 tablet by mouth every 6 (six) hours as needed. Patient not taking: Reported on 12/22/2014 12/15/14   Florian Buff, MD  ketorolac (TORADOL) 10 MG tablet Take 1 tablet (10 mg total) by mouth every 8 (eight) hours as needed. Patient not taking: Reported on 12/22/2014 12/15/14   Florian Buff, MD  polyethylene glycol powder (GLYCOLAX/MIRALAX) powder Take 255 g by mouth once. Take one cap full daily daily dissolved in liquid.  If no effect in 3 days, take two capfuls daily. Patient not taking: Reported on 12/22/2014 11/13/14   Forde Dandy, MD  spironolactone (ALDACTONE) 25 MG tablet Take 1 tablet (25 mg total) by mouth daily. Patient not taking: Reported on 11/13/2014 10/08/14   Sanda Klein, MD  valACYclovir (VALTREX) 500 MG tablet Take one tablet by mouth once daily Patient not taking: Reported on 12/22/2014 10/22/14   Christin Fudge, CNM   BP 135/82 mmHg  Pulse 71  Temp(Src) 97.6 F (36.4 C) (Oral)  Resp 18  Ht 5\' 7"  (1.702 m)  Wt 240 lb (108.863 kg)  BMI 37.58 kg/m2  SpO2 96%  LMP 08/08/2014 Physical Exam  1720: Physical examination:  Nursing notes reviewed; Vital signs and O2 SAT reviewed;  Constitutional: Well developed, Well nourished, Well hydrated, In no acute distress; Head:  Normocephalic, atraumatic; Eyes: EOMI, PERRL, No scleral icterus; ENMT: Mouth and pharynx normal, Mucous membranes  moist; Neck: Supple, Full range of motion, No lymphadenopathy; Cardiovascular: Regular rate and rhythm, No murmur, rub, or gallop; Respiratory: Breath sounds clear & equal bilaterally, No rales, rhonchi, wheezes.  Speaking full sentences with ease, Normal respiratory effort/excursion; Chest: Nontender, Movement normal; Abdomen: Soft, Nontender, Nondistended, Normal bowel sounds; Genitourinary: No CVA tenderness; Extremities: Pulses normal, No tenderness, No edema, No calf edema or asymmetry.; Neuro: AA&Ox3, Major CN grossly intact.  Speech clear. No gross focal motor or sensory deficits in extremities.; Skin: Color normal, Warm, Dry.    ED Course  Procedures (including critical care time) Labs Review   Imaging Review  I have personally reviewed and evaluated these images and lab results as part of my medical decision-making.   EKG Interpretation None      MDM  MDM Reviewed: previous chart, nursing note and vitals  Reviewed previous: labs Interpretation: labs and x-ray      Results for orders placed or performed during the hospital encounter of 01/13/15  Comprehensive metabolic panel  Result Value Ref Range   Sodium 136 135 - 145 mmol/L   Potassium 3.4 (L) 3.5 - 5.1 mmol/L   Chloride 100 (L) 101 - 111 mmol/L   CO2 27 22 - 32 mmol/L   Glucose, Bld 104 (H) 65 - 99 mg/dL   BUN 9 6 - 20 mg/dL   Creatinine, Ser 1.06 (H) 0.44 - 1.00 mg/dL   Calcium 8.9 8.9 - 10.3 mg/dL   Total Protein 8.0 6.5 - 8.1 g/dL   Albumin 4.1 3.5 - 5.0 g/dL   AST 23 15 - 41 U/L   ALT 22 14 - 54 U/L   Alkaline Phosphatase 71 38 - 126 U/L   Total Bilirubin 0.6 0.3 - 1.2 mg/dL   GFR calc non Af Amer >60 >60 mL/min   GFR calc Af Amer >60 >60 mL/min   Anion gap 9 5 - 15  Lipase, blood  Result Value Ref Range   Lipase 32 22 - 51 U/L  CBC with Differential  Result Value Ref Range   WBC 10.5 4.0 - 10.5 K/uL   RBC 4.32 3.87 - 5.11 MIL/uL   Hemoglobin 10.6 (L) 12.0 - 15.0 g/dL   HCT 34.0 (L) 36.0 - 46.0 %    MCV 78.7 78.0 - 100.0 fL   MCH 24.5 (L) 26.0 - 34.0 pg   MCHC 31.2 30.0 - 36.0 g/dL   RDW 17.2 (H) 11.5 - 15.5 %   Platelets 406 (H) 150 - 400 K/uL   Neutrophils Relative % 59 %   Neutro Abs 6.1 1.7 - 7.7 K/uL   Lymphocytes Relative 32 %   Lymphs Abs 3.3 0.7 - 4.0 K/uL   Monocytes Relative 6 %   Monocytes Absolute 0.7 0.1 - 1.0 K/uL   Eosinophils Relative 3 %   Eosinophils Absolute 0.3 0.0 - 0.7 K/uL   Basophils Relative 0 %   Basophils Absolute 0.0 0.0 - 0.1 K/uL  Pregnancy, urine  Result Value Ref Range   Preg Test, Ur NEGATIVE NEGATIVE  Urinalysis, Routine w reflex microscopic  Result Value Ref Range   Color, Urine YELLOW YELLOW   APPearance CLEAR CLEAR   Specific Gravity, Urine 1.020 1.005 - 1.030   pH 7.0 5.0 - 8.0   Glucose, UA NEGATIVE NEGATIVE mg/dL   Hgb urine dipstick NEGATIVE NEGATIVE   Bilirubin Urine NEGATIVE NEGATIVE   Ketones, ur NEGATIVE NEGATIVE mg/dL   Protein, ur NEGATIVE NEGATIVE mg/dL   Urobilinogen, UA 0.2 0.0 - 1.0 mg/dL   Nitrite NEGATIVE NEGATIVE   Leukocytes, UA NEGATIVE NEGATIVE   Dg Abd Acute W/chest 01/13/2015   CLINICAL DATA:  Flank and abdominal pain, initial encounter  EXAM: DG ABDOMEN ACUTE W/ 1V CHEST  COMPARISON:  11/13/2014  FINDINGS: Cardiac shadow is within normal limits. The lungs are clear bilaterally.  Scattered large and small bowel gas is noted. Fecal material is noted within the colon without obstructive change. No abnormal mass or abnormal calcifications are noted. No acute bony abnormality is seen.  IMPRESSION: No acute abnormality noted.   Electronically Signed   By: Inez Catalina M.D.   On: 01/13/2015 18:50    1925:  Pt now states she has been "taking BC powders and motrin" which worsens her abd symptoms. Diet and meds precautions given, as well as instructions to take GI meds regularly  for good control of her symptoms. Pt states she feels better and wants to go home now. VS remain stable, resps easy, abd benign. Tx symptomatically  at this time. Dx and testing d/w pt.  Questions answered.  Verb understanding, agreeable to d/c home with outpt f/u.   Francine Graven, DO 01/17/15 1750

## 2015-01-13 NOTE — ED Notes (Signed)
Pt c/o intermittent bilateral flank pain that radiates around to her sides and nausea x 2 weeks. Denies vomiting, diarrhea, urinary and bowel problems. Pt describes the pain as "burning" and "gnawing". Pt has hx of gastritis approximately 1 year ago after EGD was done and pt reports her symptoms feel the same. Pt reports she has been using Carafate and Zofran with no relief. Pt also reports decreased appetite.

## 2015-01-13 NOTE — Discharge Instructions (Signed)
°Emergency Department Resource Guide °1) Find a Doctor and Pay Out of Pocket °Although you won't have to find out who is covered by your insurance plan, it is a good idea to ask around and get recommendations. You will then need to call the office and see if the doctor you have chosen will accept you as a new patient and what types of options they offer for patients who are self-pay. Some doctors offer discounts or will set up payment plans for their patients who do not have insurance, but you will need to ask so you aren't surprised when you get to your appointment. ° °2) Contact Your Local Health Department °Not all health departments have doctors that can see patients for sick visits, but many do, so it is worth a call to see if yours does. If you don't know where your local health department is, you can check in your phone book. The CDC also has a tool to help you locate your state's health department, and many state websites also have listings of all of their local health departments. ° °3) Find a Walk-in Clinic °If your illness is not likely to be very severe or complicated, you may want to try a walk in clinic. These are popping up all over the country in pharmacies, drugstores, and shopping centers. They're usually staffed by nurse practitioners or physician assistants that have been trained to treat common illnesses and complaints. They're usually fairly quick and inexpensive. However, if you have serious medical issues or chronic medical problems, these are probably not your best option. ° °No Primary Care Doctor: °- Call [facility] Connect at  [phone] - they can help you locate a primary care doctor that  accepts your insurance, provides certain services, etc. °- Physician Referral Service- 1-800-533-3463 ° °Chronic Pain Problems: °Organization         Address  Phone   Notes  °[facility]  ([phone]) [phone] Patients need to be referred by their primary care doctor.  ° °Medication  Assistance: °Organization         Address  Phone   Notes  °[place] [facility] [address] °[place], [place] [address] (336) 641-8030 --Must be a resident of [place] °-- Must have NO insurance coverage whatsoever (no Medicaid/ Medicare, etc.) °-- The pt. MUST have a primary care doctor that directs their care regularly and follows them in the community °  °[facility]  (866) 331-1348   °[employer]  (888) 892-1162   ° °Agencies that provide inexpensive medical care: °Organization         Address  Phone   Notes  °[facility]  (336) 832-8035   °[facility]    (336) 832-7272   °[facility] [address] °[place], [place] [address] (336) 832-4777   °[facility] [address], °[place] (336) 271-4999   °[facility]    (336) 373-0678   °[facility]    (336) 272-1050   °[facility] ° [address], [place] Phone:  (336) 832-4444, Fax:  (336) 832-4440 Hours of Operation:  9 am - 6 pm, M-F.  Also accepts Medicaid/Medicare and self-pay.  °[facility] ° [address],  Phone: (336) [phone], Fax: (336) [phone]. Hours of Operation:  8:30 am - 5:30 pm, M-F.  Also accepts Medicaid and self-pay.  °[facility] [place] [address], [place] Phone: (336) 878-6027   °[facility] [address], [place], [place] (336)723-1848, Ext. 123 Mondays & Thursdays: 7-9 AM.  First 15 patients are seen on a first come, first serve basis. °  ° °Medicaid-accepting [place] Providers: ° °Organization         Address  Phone   Notes  °[facility] [address], [place] (336) 641-2100 Also accepts self-pay patients.  °[facility] [address], [place] ° (336) [phone]   °[facility] [address], [place]  (336) 288-8857   °[facility] [address] [place] [address], [place] (336) 299-7000   °Veita Bland [address], [place]  ° (336) [phone] Only accepts [place] Access Medicaid patients after they have their name applied to their card.  ° °Self-Pay (no insurance) in [place]: ° °Organization         Address  Phone   Notes  °Sickle Cell Patients, [facility] [address], [place] (336) 832-1970   °[facility] [address], [place] (336) 832-4400   °[facility] ° [address], [place] (336) 992-4800   °[facility]/Dr. Osei-Bonsu ° [address] [place] [address], [place] or [address], [place] (336) [phone] Phone number for both [place] and [place] locations is the same.  °[facility] [address], [place] (336) 299-0000   °[facility] [address] [place] [address], [place] or [address] (336) 852-7530 °(336) 878-2260   °[facility] [address], [place] (336) 350-1642, phone; (336) 294-5005, fax Sees patients 1st and 3rd Saturday of every month.  Must not qualify for public or private insurance (i.e. Medicaid, Medicare, [place] Health Choice, Veterans' Benefits) • Household income should be no more than 200% of the poverty level •The clinic cannot treat you if you are pregnant or think you are pregnant • Sexually transmitted diseases are not treated at the clinic.  ° ° °Dental Care: °Organization         Address  Phone  Notes  °[place] [facility] [address], [place] (336) 641-6152 Accepts children up to age 21 who are enrolled in Medicaid or [place] Health Choice; pregnant women with a Medicaid card; and children who have applied for Medicaid or [place] Health Choice, but were declined, whose parents can pay a reduced fee at time of service.  °[place]  [facility] [place]  [address], [place] (336) 641-7733 Accepts children up to age 21 who are enrolled in Medicaid or [place] Health Choice; pregnant women with a Medicaid card; and children who have applied for Medicaid or [place] Health Choice, but were declined, whose parents can pay a reduced fee at time of service.  °[facility] ° [address], [place] (336) 641-4533 Patients are seen by appointment only. Walk-ins are not accepted. [facility] will see patients 18 years of age and older. °Monday - Tuesday (8am-5pm) °Most Wednesdays (8:30-5pm) °$30 per visit, cash only  °[facility] ° [address], [place] (336) [phone] Patients are seen by appointment only. Walk-ins are not accepted. [facility] will see patients 18 years of age and older. °One   Wednesday Evening (Monthly: Volunteer Based).  $30 per visit, cash only  °[facility]  (919) 537-3737 for adults; Children under age 4, call Graduate Pediatric Dentistry at ([phone]) [phone]. Children aged 4-14, please call ([phone]) [phone] to request a pediatric application. ° Dental services are provided in all areas of dental care including fillings, crowns and bridges, complete and partial dentures, implants, gum treatment, root canals, and extractions. Preventive care is also provided. Treatment is provided to both adults and children. °Patients are selected via a lottery and there is often a waiting list. °  °[facility] [address], °[place] ° (336) 763-8833 [email or web address] °  °[facility] [address], [place], [place] (336)723-1848, Ext. 123 Second and Fourth Thursday of each month, opens at 6:30 AM; Clinic ends at 9 AM.  Patients are seen on a first-come first-served basis, and a limited number are seen during each clinic.  ° °[facility] ° [address], [place], [place] (336) [phone]    Eligibility Requirements °You must have lived in [place], [place], or [place] counties for at least the last three months. °  You cannot be eligible for state or federal sponsored healthcare insurance, including Veterans Administration, Medicaid, or Medicare. °  You generally cannot be eligible for healthcare insurance through your employer.  °  How to apply: °Eligibility screenings are held every Tuesday and Wednesday afternoon from 1:00 pm until 4:00 pm. You do not need an appointment for the interview!  °[facility] [address], [place], [place] 336-631-2330   °[facility]  336-342-8273   °[facility]  336-703-3100   °[facility]  336-570-6415   ° °Behavioral Health Resources in the Community: °Intensive Outpatient Programs °Organization         Address  Phone  Notes  °[facility] [address], [place], [place] 336-878-6098   °[facility] [address], [place], [place] 336-832-9800   °[facility] [address], [place], [place] ° 336-882-2125   °[facility] [address],  °[place], [place] 1-800-853-5163 or 336-641-4981   °Substance Abuse Resources °Organization         Address  Phone  Notes  °[facility]  336-882-2125   °[facility]  336-784-9470   °[facility]  336-285-9073   °[facility]  336-845-3988   °Residential & Outpatient Substance Abuse Program  1-800-659-3381   °Psychological Services °Organization         Address  Phone  Notes  °[facility]  336- 832-9600   °[facility]  336- 378-7881   °[facility] [address], [place] 1-800-853-5163 or 336-641-4981   ° °Mobile Crisis Teams °Organization         Address  Phone  Notes  °[facility], Mobile Crisis Care Unit  1-877-626-1772   °Assertive °[facility] ° [address]  [place], [place] 336-834-9664   °Sharon DeEsch [address] °[place] 336-554-5454   ° °Self-Help/Support Groups °Organization         Address  Phone             Notes  °[facility]  - variety of support groups  336- 373-1402 Call for more information  °Narcotics Anonymous (NA), [facility] [address], °[place]  2 meetings at this location  ° °  Residential Treatment Programs Organization         Address  Phone  Notes  [facility] [address],    [place]  1-3868079911   [facility]  [address] [place] [address], [place], [place]   [place], [place] (416) 434-8866 Admissions: 8am-3pm M-F  [facility] [place] [address]    [place], [place] 093-818-2993   [facility] [address] [place], [place], [place]   [facility] [address],  [place], [place]   [facility] Intensive Outpatient [address], [place], [place]   [facility] ([place]  [place], [place] 1-806-508-1418 or 763 108 1429   [facility] [address] [place], [place] Accepts Medicaid  [facility] [address]  [place] 1-(604) 451-5124 Substance Abuse/Addiction Treatment   [facility] Organization         Address  Phone  Notes  [facility]  [phone]   Domenic Schwab, PhD [address] [place], [place]   (760) 265-7530 or (727)128-3578   [place], [place] [phone]   [facility] [address], [place], [place] 226-077-3139 Insurance/Medicaid/sponsorship through [facility] [address]                                    [place], [place] (289)034-6015 [place] [address], [place] [phone]    Dr. Adele Schilder  [phone]   [facility] Dept. 1) [address] [place] [address] [place] 2) [place] 3)  [address], [place] [phone]  [phone]   [place] 769-564-6194 or [phone] (After Hours)      Eat a bland diet, avoiding greasy, fatty, fried foods, as well as spicy and acidic foods or beverages.  Avoid eating within the hour or 2 before going to bed or laying down.  Also avoid teas, colas, coffee, chocolate, pepermint and spearment.  Take over the counter pepcid, one tablet by mouth twice a day, for the next 3 to 4 weeks.  May also take over the counter maalox/mylanta, as directed on packaging, as needed for discomfort.  Take the prescriptions as directed.  Call your regular medical doctor tomorrow to schedule a follow up appointment in the next 3 days. Call the GI doctor tomorrow to schedule a follow up appointment within the next week.  Return to the Emergency Department immediately if worsening.

## 2015-01-13 NOTE — ED Notes (Signed)
Spoke with X-Ray, they are waiting on urine preg to result before taking pt for x-ray.

## 2015-01-18 ENCOUNTER — Telehealth (INDEPENDENT_AMBULATORY_CARE_PROVIDER_SITE_OTHER): Payer: Self-pay | Admitting: *Deleted

## 2015-01-18 NOTE — Telephone Encounter (Signed)
Dr.Rehman has ask that this patient be given an appointment this week. She may see Terri. Patient was seen in the ED.

## 2015-01-18 NOTE — Telephone Encounter (Signed)
LM for patient to return the call.  

## 2015-01-25 NOTE — Telephone Encounter (Signed)
Patient already has a GI doctor in [place].

## 2015-01-25 NOTE — Telephone Encounter (Signed)
LM for patient to return the call.  

## 2015-01-28 ENCOUNTER — Emergency Department (HOSPITAL_COMMUNITY)
Admission: EM | Admit: 2015-01-28 | Discharge: 2015-01-28 | Disposition: A | Discharge status: Home / Self Care | Payer: PRIVATE HEALTH INSURANCE | Attending: Emergency Medicine | Admitting: Emergency Medicine

## 2015-01-28 ENCOUNTER — Emergency Department (HOSPITAL_COMMUNITY): Payer: PRIVATE HEALTH INSURANCE

## 2015-01-28 ENCOUNTER — Encounter (HOSPITAL_COMMUNITY): Payer: Self-pay | Admitting: Emergency Medicine

## 2015-01-28 DIAGNOSIS — F419 Anxiety disorder, unspecified: Secondary | ICD-10-CM | POA: Insufficient documentation

## 2015-01-28 DIAGNOSIS — E785 Hyperlipidemia, unspecified: Secondary | ICD-10-CM | POA: Insufficient documentation

## 2015-01-28 DIAGNOSIS — I10 Essential (primary) hypertension: Secondary | ICD-10-CM | POA: Insufficient documentation

## 2015-01-28 DIAGNOSIS — E669 Obesity, unspecified: Secondary | ICD-10-CM | POA: Insufficient documentation

## 2015-01-28 DIAGNOSIS — Z7982 Long term (current) use of aspirin: Secondary | ICD-10-CM | POA: Insufficient documentation

## 2015-01-28 DIAGNOSIS — Z3202 Encounter for pregnancy test, result negative: Secondary | ICD-10-CM | POA: Insufficient documentation

## 2015-01-28 DIAGNOSIS — Z8669 Personal history of other diseases of the nervous system and sense organs: Secondary | ICD-10-CM | POA: Insufficient documentation

## 2015-01-28 DIAGNOSIS — Z9049 Acquired absence of other specified parts of digestive tract: Secondary | ICD-10-CM | POA: Diagnosis not present

## 2015-01-28 DIAGNOSIS — R1011 Right upper quadrant pain: Secondary | ICD-10-CM | POA: Diagnosis present

## 2015-01-28 DIAGNOSIS — R109 Unspecified abdominal pain: Secondary | ICD-10-CM

## 2015-01-28 DIAGNOSIS — Z79899 Other long term (current) drug therapy: Secondary | ICD-10-CM | POA: Insufficient documentation

## 2015-01-28 DIAGNOSIS — K219 Gastro-esophageal reflux disease without esophagitis: Secondary | ICD-10-CM | POA: Insufficient documentation

## 2015-01-28 DIAGNOSIS — Z90722 Acquired absence of ovaries, bilateral: Secondary | ICD-10-CM | POA: Insufficient documentation

## 2015-01-28 LAB — CBC WITH DIFFERENTIAL/PLATELET
BASOS PCT: 0 %
Basophils Absolute: 0 10*3/uL (ref 0.0–0.1)
Eosinophils Absolute: 0.3 10*3/uL (ref 0.0–0.7)
Eosinophils Relative: 3 %
HEMATOCRIT: 34.6 % — AB (ref 36.0–46.0)
HEMOGLOBIN: 10.9 g/dL — AB (ref 12.0–15.0)
LYMPHS ABS: 3.2 10*3/uL (ref 0.7–4.0)
LYMPHS PCT: 26 %
MCH: 24.8 pg — ABNORMAL LOW (ref 26.0–34.0)
MCHC: 31.5 g/dL (ref 30.0–36.0)
MCV: 78.6 fL (ref 78.0–100.0)
MONO ABS: 0.8 10*3/uL (ref 0.1–1.0)
MONOS PCT: 6 %
NEUTROS ABS: 7.9 10*3/uL — AB (ref 1.7–7.7)
NEUTROS PCT: 65 %
Platelets: 372 10*3/uL (ref 150–400)
RBC: 4.4 MIL/uL (ref 3.87–5.11)
RDW: 16.5 % — ABNORMAL HIGH (ref 11.5–15.5)
WBC: 12.2 10*3/uL — ABNORMAL HIGH (ref 4.0–10.5)

## 2015-01-28 LAB — COMPREHENSIVE METABOLIC PANEL
ALBUMIN: 4.1 g/dL (ref 3.5–5.0)
ALK PHOS: 79 U/L (ref 38–126)
ALT: 17 U/L (ref 14–54)
ANION GAP: 9 (ref 5–15)
AST: 20 U/L (ref 15–41)
BILIRUBIN TOTAL: 0.8 mg/dL (ref 0.3–1.2)
BUN: 12 mg/dL (ref 6–20)
CALCIUM: 9.3 mg/dL (ref 8.9–10.3)
CO2: 30 mmol/L (ref 22–32)
Chloride: 100 mmol/L — ABNORMAL LOW (ref 101–111)
Creatinine, Ser: 1.14 mg/dL — ABNORMAL HIGH (ref 0.44–1.00)
GFR calc Af Amer: >60 mL/min (ref >60)
GFR, EST NON AFRICAN AMERICAN: 56 mL/min — AB (ref >60)
GLUCOSE: 101 mg/dL — AB (ref 65–99)
Potassium: 3.2 mmol/L — ABNORMAL LOW (ref 3.5–5.1)
Sodium: 139 mmol/L (ref 135–145)
TOTAL PROTEIN: 7.9 g/dL (ref 6.5–8.1)

## 2015-01-28 LAB — URINALYSIS, ROUTINE W REFLEX MICROSCOPIC
BILIRUBIN URINE: NEGATIVE
GLUCOSE, UA: NEGATIVE mg/dL
Hgb urine dipstick: NEGATIVE
KETONES UR: NEGATIVE mg/dL
Leukocytes, UA: NEGATIVE
Nitrite: NEGATIVE
PH: 6 (ref 5.0–8.0)
Protein, ur: NEGATIVE mg/dL
Specific Gravity, Urine: 1.025 (ref 1.005–1.030)
Urobilinogen, UA: 0.2 mg/dL (ref 0.0–1.0)

## 2015-01-28 LAB — PREGNANCY, URINE: Preg Test, Ur: NEGATIVE

## 2015-01-28 LAB — LIPASE, BLOOD: Lipase: 30 U/L (ref 11–51)

## 2015-01-28 MED ORDER — DICYCLOMINE HCL 20 MG PO TABS: 20.0000 mg | ORAL_TABLET | Freq: Four times a day (QID) | ORAL | Status: DC | PRN

## 2015-01-28 MED ORDER — DICYCLOMINE HCL 10 MG/ML IM SOLN
20.0000 mg | Freq: Once | INTRAMUSCULAR | Status: AC
  Administered 2015-01-28: 20 mg via INTRAMUSCULAR
  Filled 2015-01-28: qty 2

## 2015-01-28 NOTE — ED Provider Notes (Signed)
CSN: [id number]     Arrival date & time 01/28/15  1817 History   First MD Initiated Contact with Patient 01/28/15 2124     Chief Complaint  Patient presents with  . Abdominal Pain      HPI Pt was seen at 2135.  Per pt, c/o gradual onset and persistence of constant RUQ abd "pain" for the past week. Describes the abd pain as "sharp."  Pt states she was evaluated by her GI MD in [place], and had a CT AP performed. Pt states she was told "it was all normal." Pt continued to have pain, so she came to the ED for further evaluation.  Denies N/V, no diarrhea, no fevers, no back pain, no rash, no CP/SOB, no black or blood in stools.    Past Medical History  Diagnosis Date  . Hypertension   . Anxiety   . Hyperlipidemia   . GERD (gastroesophageal reflux disease)   . Prediabetes   . Palpitations   . Obesity   . Sleep apnea   . Sleep apnea    Past Surgical History  Procedure Laterality Date  . Cholecystectomy    . Doppler echocardiography  07/30/2000 [place]    EF 55-66%,lv normal  . Nm myocar perf wall motion  10/22/2011  . Cardiac catheterization  11/25/2001    norm. LV fx,norm. abd aorta, both renal arteries patent  . Oophorectomy  2008    lt  . Dilitation & currettage/hystroscopy with novasure ablation N/A 12/15/2014    Procedure: DILATATION & CURETTAGE/HYSTEROSCOPY WITH ENDOMETRIAL NOVASURE ABLATION;  Surgeon: Florian Buff, MD;  Location: [facility];  Service: Gynecology;  Laterality: N/A;  . Cervical polypectomy N/A 12/15/2014    Procedure: ENDOMETRIAL POLYPECTOMY;  Surgeon: Florian Buff, MD;  Location: [facility];  Service: Gynecology;  Laterality: N/A;   Family History  Problem Relation Age of Onset  . Hypertension Mother   . Heart attack Mother   . Seizures Father   . Hypertension Brother   . Hypertension Brother   . Hypertension Sister   . Early death Brother   . Hypertension Son    Social History  Substance Use Topics  . Smoking status: Never Smoker   . Smokeless  tobacco: Never Used  . Alcohol Use: Yes     Comment: Occasionally, once a month   OB History    Gravida Para Term Preterm AB TAB SAB Ectopic Multiple Living   6 3 3  3  3         Review of Systems ROS: Statement: All systems negative except as marked or noted in the HPI; Constitutional: Negative for fever and chills. ; ; Eyes: Negative for eye pain, redness and discharge. ; ; ENMT: Negative for ear pain, hoarseness, nasal congestion, sinus pressure and sore throat. ; ; Cardiovascular: Negative for chest pain, palpitations, diaphoresis, dyspnea and peripheral edema. ; ; Respiratory: Negative for cough, wheezing and stridor. ; ; Gastrointestinal: +abd pain. Negative for nausea, vomiting, diarrhea, blood in stool, hematemesis, jaundice and rectal bleeding. . ; ; Genitourinary: Negative for dysuria, flank pain and hematuria. ; ; Musculoskeletal: Negative for back pain and neck pain. Negative for swelling and trauma.; ; Skin: Negative for pruritus, rash, abrasions, blisters, bruising and skin lesion.; ; Neuro: Negative for headache, lightheadedness and neck stiffness. Negative for weakness, altered level of consciousness , altered mental status, extremity weakness, paresthesias, involuntary movement, seizure and syncope.      Allergies  Septra  Home Medications   Prior to  Admission medications   Medication Sig Start Date End Date Taking? Authorizing Provider  aspirin EC 81 MG tablet Take 81 mg by mouth daily.     Yes Historical Provider, MD  hydrochlorothiazide (HYDRODIURIL) 25 MG tablet Take 12.5 mg by mouth daily.   Yes Historical Provider, MD  omeprazole (PRILOSEC) 40 MG capsule Take 40 mg by mouth daily.  01/13/15  Yes Historical Provider, MD  propranolol ER (INDERAL LA) 120 MG 24 hr capsule TAKE 2 CAPSULES (240 MG TOTAL) BY MOUTH DAILY. 10/25/14  Yes Mihai Croitoru, MD  sertraline (ZOLOFT) 50 MG tablet Take 50 mg by mouth daily.   Yes Historical Provider, MD  simvastatin (ZOCOR) 10 MG tablet  Take 10 mg by mouth at bedtime.    Yes Historical Provider, MD  sucralfate (CARAFATE) 1 G tablet Take 1 tablet by mouth 3 (three) times daily before meals. 01/08/15  Yes Historical Provider, MD  dicyclomine (BENTYL) 20 MG tablet Take 1 tablet (20 mg total) by mouth every 6 (six) hours as needed for spasms (abdominal cramping). 01/28/15   Francine Graven, DO  HYDROcodone-acetaminophen (NORCO/VICODIN) 5-325 MG per tablet Take 1 tablet by mouth every 6 (six) hours as needed. Patient not taking: Reported on 12/22/2014 12/15/14   Florian Buff, MD  ketorolac (TORADOL) 10 MG tablet Take 1 tablet (10 mg total) by mouth every 8 (eight) hours as needed. Patient not taking: Reported on 12/22/2014 12/15/14   Florian Buff, MD  ondansetron (ZOFRAN) 4 MG tablet Take 1 tablet (4 mg total) by mouth every 8 (eight) hours as needed for nausea or vomiting. Patient not taking: Reported on 01/28/2015 01/13/15   Francine Graven, DO  ondansetron (ZOFRAN) 8 MG tablet Take 1 tablet (8 mg total) by mouth every 8 (eight) hours as needed for nausea. Patient not taking: Reported on 01/28/2015 12/15/14   Florian Buff, MD  polyethylene glycol powder (GLYCOLAX/MIRALAX) powder Take 255 g by mouth once. Take one cap full daily daily dissolved in liquid.  If no effect in 3 days, take two capfuls daily. Patient not taking: Reported on 12/22/2014 11/13/14   Forde Dandy, MD  potassium chloride (K-DUR) 10 MEQ tablet Take 1 tablet (10 mEq total) by mouth daily. Patient not taking: Reported on 01/28/2015 11/09/14   Sanda Klein, MD  spironolactone (ALDACTONE) 25 MG tablet Take 1 tablet (25 mg total) by mouth daily. Patient not taking: Reported on 11/13/2014 10/08/14   Mihai Croitoru, MD   BP 141/77 mmHg  Pulse 75  Temp(Src) 98 F (36.7 C) (Oral)  Resp 16  Ht 5\' 7"  (1.702 m)  Wt 240 lb (108.863 kg)  BMI 37.58 kg/m2  SpO2 100%  LMP 08/08/2014 Physical Exam  2140; Physical examination:  Nursing notes reviewed; Vital signs and O2 SAT  reviewed;  Constitutional: Well developed, Well nourished, Well hydrated, In no acute distress; Head:  Normocephalic, atraumatic; Eyes: EOMI, PERRL, No scleral icterus; ENMT: Mouth and pharynx normal, Mucous membranes moist; Neck: Supple, Full range of motion, No lymphadenopathy; Cardiovascular: Regular rate and rhythm, No murmur, rub, or gallop; Respiratory: Breath sounds clear & equal bilaterally, No rales, rhonchi, wheezes.  Speaking full sentences with ease, Normal respiratory effort/excursion; Chest: Nontender, Movement normal; Abdomen: Soft, +mild RUQ tenderness to palp. No rebound or guarding. Nondistended, Normal bowel sounds; Genitourinary: No CVA tenderness; Extremities: Pulses normal, No tenderness, No edema, No calf edema or asymmetry.; Neuro: AA&Ox3, Major CN grossly intact.  Speech clear. No gross focal motor or sensory deficits in extremities.  Climbs on and off stretcher easily by herself. Gait steady.; Skin: Color normal, Warm, Dry.   ED Course  Procedures (including critical care time) Labs Review   Imaging Review  I have personally reviewed and evaluated these images and lab results as part of my medical decision-making.   EKG Interpretation None      MDM  MDM Reviewed: previous chart, nursing note and vitals Reviewed previous: labs and x-ray Interpretation: labs      Results for orders placed or performed during the hospital encounter of 01/28/15  CBC WITH DIFFERENTIAL  Result Value Ref Range   WBC 12.2 (H) 4.0 - 10.5 K/uL   RBC 4.40 3.87 - 5.11 MIL/uL   Hemoglobin 10.9 (L) 12.0 - 15.0 g/dL   HCT 34.6 (L) 36.0 - 46.0 %   MCV 78.6 78.0 - 100.0 fL   MCH 24.8 (L) 26.0 - 34.0 pg   MCHC 31.5 30.0 - 36.0 g/dL   RDW 16.5 (H) 11.5 - 15.5 %   Platelets 372 150 - 400 K/uL   Neutrophils Relative % 65 %   Neutro Abs 7.9 (H) 1.7 - 7.7 K/uL   Lymphocytes Relative 26 %   Lymphs Abs 3.2 0.7 - 4.0 K/uL   Monocytes Relative 6 %   Monocytes Absolute 0.8 0.1 - 1.0 K/uL    Eosinophils Relative 3 %   Eosinophils Absolute 0.3 0.0 - 0.7 K/uL   Basophils Relative 0 %   Basophils Absolute 0.0 0.0 - 0.1 K/uL  Comprehensive metabolic panel  Result Value Ref Range   Sodium 139 135 - 145 mmol/L   Potassium 3.2 (L) 3.5 - 5.1 mmol/L   Chloride 100 (L) 101 - 111 mmol/L   CO2 30 22 - 32 mmol/L   Glucose, Bld 101 (H) 65 - 99 mg/dL   BUN 12 6 - 20 mg/dL   Creatinine, Ser 1.14 (H) 0.44 - 1.00 mg/dL   Calcium 9.3 8.9 - 10.3 mg/dL   Total Protein 7.9 6.5 - 8.1 g/dL   Albumin 4.1 3.5 - 5.0 g/dL   AST 20 15 - 41 U/L   ALT 17 14 - 54 U/L   Alkaline Phosphatase 79 38 - 126 U/L   Total Bilirubin 0.8 0.3 - 1.2 mg/dL   GFR calc non Af Amer 56 (L) >60 mL/min   GFR calc Af Amer >60 >60 mL/min   Anion gap 9 5 - 15  Urinalysis, Routine w reflex microscopic (not at [facility])  Result Value Ref Range   Color, Urine YELLOW YELLOW   APPearance CLEAR CLEAR   Specific Gravity, Urine 1.025 1.005 - 1.030   pH 6.0 5.0 - 8.0   Glucose, UA NEGATIVE NEGATIVE mg/dL   Hgb urine dipstick NEGATIVE NEGATIVE   Bilirubin Urine NEGATIVE NEGATIVE   Ketones, ur NEGATIVE NEGATIVE mg/dL   Protein, ur NEGATIVE NEGATIVE mg/dL   Urobilinogen, UA 0.2 0.0 - 1.0 mg/dL   Nitrite NEGATIVE NEGATIVE   Leukocytes, UA NEGATIVE NEGATIVE  Lipase, blood  Result Value Ref Range   Lipase 30 11 - 51 U/L  Pregnancy, urine  Result Value Ref Range   Preg Test, Ur NEGATIVE NEGATIVE     2300:  Pt states she has potassium rx, and will take it when she gets home. Pt does not want another imaging study today. States she feels better after bentyl and wants to go home now. Dx and testing d/w pt.  Questions answered.  Verb understanding, agreeable to d/c home with outpt f/u.  Francine Graven, DO 02/01/15 [phone]

## 2015-01-28 NOTE — Discharge Instructions (Signed)
°Emergency Department Resource Guide °1) Find a Doctor and Pay Out of Pocket °Although you won't have to find out who is covered by your insurance plan, it is a good idea to ask around and get recommendations. You will then need to call the office and see if the doctor you have chosen will accept you as a new patient and what types of options they offer for patients who are self-pay. Some doctors offer discounts or will set up payment plans for their patients who do not have insurance, but you will need to ask so you aren't surprised when you get to your appointment. ° °2) Contact Your Local Health Department °Not all health departments have doctors that can see patients for sick visits, but many do, so it is worth a call to see if yours does. If you don't know where your local health department is, you can check in your phone book. The CDC also has a tool to help you locate your state's health department, and many state websites also have listings of all of their local health departments. ° °3) Find a Walk-in Clinic °If your illness is not likely to be very severe or complicated, you may want to try a walk in clinic. These are popping up all over the country in pharmacies, drugstores, and shopping centers. They're usually staffed by nurse practitioners or physician assistants that have been trained to treat common illnesses and complaints. They're usually fairly quick and inexpensive. However, if you have serious medical issues or chronic medical problems, these are probably not your best option. ° °No Primary Care Doctor: °- Call [facility] at  832-8000 - they can help you locate a primary care doctor that  accepts your insurance, provides certain services, etc. °- Physician Referral Service- 1-800-533-3463 ° °Chronic Pain Problems: °Organization         Address  Phone   Notes  °[facility]  ([phone]) [phone] Patients need to be referred by their primary care doctor.  ° °Medication  Assistance: °Organization         Address  Phone   Notes  °[place] [facility] [address] °[place], [place] [address] (336) 641-8030 --Must be a resident of [place] °-- Must have NO insurance coverage whatsoever (no Medicaid/ Medicare, etc.) °-- The pt. MUST have a primary care doctor that directs their care regularly and follows them in the community °  °[facility]  (866) 331-1348   °[employer]  (888) 892-1162   ° °Agencies that provide inexpensive medical care: °Organization         Address  Phone   Notes  °[facility]  (336) [phone]   °[facility]    (336) 832-7272   °[facility] [address] °[place], [place] [address] (336) 832-4777   °[facility] [address], °[place] (336) 271-4999   °[facility]    (336) 373-0678   °[facility]    (336) 272-1050   °[facility] ° [address], [place] Phone:  (336) 832-4444, Fax:  (336) 832-4440 Hours of Operation:  9 am - 6 pm, M-F.  Also accepts Medicaid/Medicare and self-pay.  °[facility] ° [address], [place] Phone: (336) [phone], Fax: (336) [phone]. Hours of Operation:  8:30 am - 5:30 pm, M-F.  Also accepts Medicaid and self-pay.  °[facility] [place] [address], [place] Phone: (336) 878-6027   °[facility] [address], [place], [place] (336)723-1848, Ext. 123 Mondays & Thursdays: 7-9 AM.  First 15 patients are seen on a first come, first serve basis. °  ° °Medicaid-accepting [place] Providers: ° °Organization         Address  Phone   Notes  °[facility] [address], [place] (336) [phone] Also accepts self-pay patients.  °[facility] [address], [place] ° (336) [phone]   °[facility] [address], [place]  (336) 288-8857   °[facility] [address] [place] [address], [place] (336) 299-7000   °Veita Bland [address], [place]  ° (336) [phone] Only accepts [place] Access Medicaid patients after they have their name applied to their card.  ° °Self-Pay (no insurance) in [place]: ° °Organization         Address  Phone   Notes  °Sickle Cell Patients, [facility] [address], [place] (336) 832-1970   °[facility] [address], [place] (336) 832-4400   °[facility] ° [address], [place] (336) 992-4800   °[facility]/Dr. Osei-Bonsu ° [address] [place] [address], [place] or [address], [place] (336) [phone] Phone number for both [place] and [place] locations is the same.  °[facility] [address], [place] (336) [phone]   °[facility] [address] [place] [address], [place] or [address] (336) 852-7530 °(336) 878-2260   °[facility] [address], [place] (336) 350-1642, phone; (336) 294-5005, fax Sees patients 1st and 3rd Saturday of every month.  Must not qualify for public or private insurance (i.e. Medicaid, Medicare, [place] Health Choice, Veterans' Benefits) • Household income should be no more than 200% of the poverty level •The clinic cannot treat you if you are pregnant or think you are pregnant • Sexually transmitted diseases are not treated at the clinic.  ° ° °Dental Care: °Organization         Address  Phone  Notes  °[place] [facility] [address],  (336) 641-6152 Accepts children up to age 21 who are enrolled in Medicaid or [place] Health Choice; pregnant women with a Medicaid card; and children who have applied for Medicaid or [place] Health Choice, but were declined, whose parents can pay a reduced fee at time of service.  °[place]  [facility] [place]  [address], [place] (336) 641-7733 Accepts children up to age 21 who are enrolled in Medicaid or  Health Choice; pregnant women with a Medicaid card; and children who have applied for Medicaid or  Health Choice, but were declined, whose parents can pay a reduced fee at time of service.  °[facility] ° [address],  (336) 641-4533 Patients are seen by appointment only. Walk-ins are not accepted. [facility] will see patients 18 years of age and older. °Monday - Tuesday (8am-5pm) °Most Wednesdays (8:30-5pm) °$30 per visit, cash only  °[facility] ° [address], [place] (336) [phone] Patients are seen by appointment only. Walk-ins are not accepted. [facility] will see patients 18 years of age and older. °One   Wednesday Evening (Monthly: Volunteer Based).  $30 per visit, cash only  °[facility]  (919) [phone] for adults; Children under age 4, call Graduate Pediatric Dentistry at ([phone]) [phone]. Children aged 4-14, please call ([phone]) [phone] to request a pediatric application. ° Dental services are provided in all areas of dental care including fillings, crowns and bridges, complete and partial dentures, implants, gum treatment, root canals, and extractions. Preventive care is also provided. Treatment is provided to both adults and children. °Patients are selected via a lottery and there is often a waiting list. °  °[facility] [address], °[place] ° (336) 763-8833 [email or web address] °  °[facility] [address], [place], [place] (336)723-1848, Ext. 123 Second and Fourth Thursday of each month, opens at 6:30 AM; Clinic ends at 9 AM.  Patients are seen on a first-come first-served basis, and a limited number are seen during each clinic.  ° °[facility] ° [address], [place], [place] (336) [phone]    Eligibility Requirements °You must have lived in [place], [place], or [place] counties for at least the last three months. °  You cannot be eligible for state or federal sponsored healthcare insurance, including Veterans Administration, Medicaid, or Medicare. °  You generally cannot be eligible for healthcare insurance through your employer.  °  How to apply: °Eligibility screenings are held every Tuesday and Wednesday afternoon from 1:00 pm until 4:00 pm. You do not need an appointment for the interview!  °[facility] [address], [place], [place] 336-631-2330   °[facility]  336-342-8273   °[facility]  336-703-3100   °[facility]  336-570-6415   ° °Behavioral Health Resources in the Community: °Intensive Outpatient Programs °Organization         Address  Phone  Notes  °[facility] [address], [place], [place] 336-878-6098   °[facility] [address], [place], [place] 336-832-9800   °[facility] [address], [place], [place] ° 336-882-2125   °[facility] [address],  °[place], [place] 1-800-853-5163 or 336-641-4981   °Substance Abuse Resources °Organization         Address  Phone  Notes  °[facility]  336-882-2125   °[facility]  336-784-9470   °[facility]  336-285-9073   °[facility]  336-845-3988   °Residential & Outpatient Substance Abuse Program  1-800-659-3381   °Psychological Services °Organization         Address  Phone  Notes  °[facility]  336- 832-9600   °[facility]  336- 378-7881   °[facility] [address], [place] 1-800-853-5163 or 336-641-4981   ° °Mobile Crisis Teams °Organization         Address  Phone  Notes  °[facility], Mobile Crisis Care Unit  1-877-626-1772   °Assertive °[facility] ° [address]  [place], [place] 336-834-9664   °Sharon DeEsch [address] °[place] 336-554-5454   ° °Self-Help/Support Groups °Organization         Address  Phone             Notes  °[facility]  - variety of support groups  336- 373-1402 Call for more information  °Narcotics Anonymous (NA), [facility] [address], °[place]   2 meetings at this location  ° °  Residential Treatment Programs Organization         Address  Phone  Notes  [facility] [address]    [place]  1-930 783 2269   [facility]  [address], [place] [address], [place], [place]   [place], [place] 670-204-2092 Admissions: 8am-3pm M-F  [facility] [place] [address]    [place], [place] [phone]   [facility] [address] [place], [place], [place]   [facility] [address]  [place], [place]   [facility] Intensive Outpatient [address], [place], [place]   [facility] [address]  [place], [place] 1-872-560-4114 or 904-343-1018   [facility] [address], [place], [place] Accepts Medicaid  [facility] [address]  [place] 1-562-757-7754 Substance Abuse/Addiction Treatment   [facility] Organization         Address  Phone  Notes  [facility]  587-198-3577   Domenic Schwab, PhD [address] [place], [place]   269-461-4992 or 332-573-3513   [facility] [place], [place] 807-709-5563   [facility] [address], [place], [place] (203)745-4243 Insurance/Medicaid/sponsorship through [facility] Families [address]                                    [place], [place] [phone] [place] [address], [place] [phone]    Dr. Adele Schilder  [phone]   [facility] Dept. 1) [address], [place] 2) [place] 3)  [address], [place] ([phone] ([phone]  [phone]   [place] [phone] or [phone] (After Hours)      Eat a bland diet, avoiding greasy, fatty, fried foods, as well as spicy and acidic foods or beverages.  Avoid eating within the hour or 2 before going to bed or laying down.  Also avoid teas, colas, coffee, chocolate, pepermint and spearment.  Take over the counter pepcid, one tablet by mouth twice a day, for the next 2 to 3 weeks.  May also take over the counter maalox/mylanta, as directed on packaging, as needed for discomfort.  Take the prescription as directed.  Call your regular GI doctor on Monday to schedule a follow up appointment this week.  Return to the Emergency Department immediately if worsening.

## 2015-01-28 NOTE — ED Notes (Addendum)
RUQ pin that started yesterday - No radiation - Pt had cholecystectomy 7 yrs ago - denies n/v/d    Pt had ablation preformed on September 7th - however started to have vaginal bleeding this past week

## 2015-01-31 ENCOUNTER — Encounter: Payer: Self-pay | Admitting: Obstetrics & Gynecology

## 2015-01-31 ENCOUNTER — Ambulatory Visit (INDEPENDENT_AMBULATORY_CARE_PROVIDER_SITE_OTHER): Payer: PRIVATE HEALTH INSURANCE | Admitting: Obstetrics & Gynecology

## 2015-01-31 VITALS — BP 120/70 | HR 66 | Wt 242.0 lb

## 2015-01-31 DIAGNOSIS — R102 Pelvic and perineal pain: Secondary | ICD-10-CM

## 2015-01-31 MED ORDER — KETOROLAC TROMETHAMINE 10 MG PO TABS: 10.0000 mg | ORAL_TABLET | Freq: Three times a day (TID) | ORAL | Status: DC | PRN

## 2015-01-31 MED ORDER — DOXYCYCLINE HYCLATE 100 MG PO TABS: 100.0000 mg | ORAL_TABLET | Freq: Two times a day (BID) | ORAL | Status: DC

## 2015-01-31 MED ORDER — PANTOPRAZOLE SODIUM 40 MG PO TBEC: 40.0000 mg | DELAYED_RELEASE_TABLET | Freq: Every day | ORAL | Status: DC

## 2015-01-31 NOTE — Progress Notes (Signed)
[name], female   DOB: 1965/05/20, 49 y.o.   MRN: [id number] Chief Complaint  Patient presents with  . gyn visit    pain after ablation.    Blood pressure 120/70, pulse 66, weight 242 lb (109.77 kg), last menstrual period 01/26/2015.  49 y.o. [id number] Patient's last menstrual period was 01/26/2015. The current method of family planning is none.  Subjective Pt with newer onset pelvic pain started last week Pain had resolved after the ablation according to patient Had sex 3 weeks ago Had 1 period not as heavy, not as crampy this is different  Objective Vulva:  normal appearing vulva with no masses, tenderness or lesions Vagina:  normal mucosa, no discharge Cervix:  no cervical motion tenderness and no lesions Uterus:  normal size, contour, position, consistency, mobility, non-tender Adnexa: ovaries:present,  normal adnexa in size, nontender and no masses    Pertinent ROS No burning with urination, frequency or urgency No nausea, vomiting or diarrhea Nor fever chills or other constitutional symptoms   Labs or studies Labs from her hospital were reviewed    Impression Diagnoses this Encounter::   ICD-9-CM ICD-10-CM   1. Pelvic pain in female 625.9 R10.2     Established relevant diagnosis(es): Post endometrial ablation 12/15/2014  Plan/Recommendations: Meds ordered this encounter  Medications  . doxycycline (VIBRA-TABS) 100 MG tablet    Sig: Take 1 tablet (100 mg total) by mouth 2 (two) times daily.    Dispense:  20 tablet    Refill:  0  . ketorolac (TORADOL) 10 MG tablet    Sig: Take 1 tablet (10 mg total) by mouth every 8 (eight) hours as needed.    Dispense:  15 tablet    Refill:  0  . pantoprazole (PROTONIX) 40 MG tablet    Sig: Take 1 tablet (40 mg total) by mouth daily.    Dispense:  30 tablet    Refill:  1    Labs or Scans Ordered: No orders of the defined types were placed in this encounter.    Possible post ablation  endoemendometritis vs deeper level of myometrial therapy causing ongoing pain and discharge  Follow up Return in about 2 weeks (around 02/14/2015) for Follow up, with Dr Elonda Husky.      All questions were answered.  Past Medical History  Diagnosis Date  . Hypertension   . Anxiety   . Hyperlipidemia   . GERD (gastroesophageal reflux disease)   . Prediabetes   . Palpitations   . Obesity   . Sleep apnea   . Sleep apnea     Past Surgical History  Procedure Laterality Date  . Cholecystectomy    . Doppler echocardiography  07/30/2000 [place]    EF 55-66%,lv normal  . Nm myocar perf wall motion  10/22/2011  . Cardiac catheterization  11/25/2001    norm. LV fx,norm. abd aorta, both renal arteries patent  . Oophorectomy  2008    lt  . Dilitation & currettage/hystroscopy with novasure ablation N/A 12/15/2014    Procedure: DILATATION & CURETTAGE/HYSTEROSCOPY WITH ENDOMETRIAL NOVASURE ABLATION;  Surgeon: Florian Buff, MD;  Location: [facility];  Service: Gynecology;  Laterality: N/A;  . Cervical polypectomy N/A 12/15/2014    Procedure: ENDOMETRIAL POLYPECTOMY;  Surgeon: Florian Buff, MD;  Location: [facility];  Service: Gynecology;  Laterality: N/A;    OB History    Gravida Para Term Preterm AB TAB SAB Ectopic Multiple Living   6 3 3  3   3  Allergies  Allergen Reactions  . Septra [Bactrim] Hives and Diarrhea    Social History   Social History  . Marital Status: Single    Spouse Name: N/A  . Number of Children: N/A  . Years of Education: N/A   Social History Main Topics  . Smoking status: Never Smoker   . Smokeless tobacco: Never Used  . Alcohol Use: Yes     Comment: Occasionally, once a month  . Drug Use: No  . Sexual Activity: Yes    Birth Control/ Protection: Condom, None   Other Topics Concern  . None   Social History Narrative    Family History  Problem Relation Age of Onset  . Hypertension Mother   . Heart attack Mother   . Seizures Father   .  Hypertension Brother   . Hypertension Brother   . Hypertension Sister   . Early death Brother   . Hypertension Son

## 2015-02-14 ENCOUNTER — Ambulatory Visit: Payer: PRIVATE HEALTH INSURANCE | Admitting: Obstetrics & Gynecology

## 2015-02-22 ENCOUNTER — Telehealth: Payer: Self-pay | Admitting: Cardiovascular Disease

## 2015-02-22 NOTE — Telephone Encounter (Signed)
Pt called in wanting to speak with Dr.C's nurse about receiving her CPAP machine. Please f/u with her   Thanks

## 2015-02-22 NOTE — Telephone Encounter (Signed)
Returned call to patient of Dr. Sallyanne Kuster.  She explains that she had previously had CPAP machine, this had been returned to supply company as she had not been compliant w/ use.  She wants to try to resume use - she contacted insurance company and was told she would need to contact us for order or recommendations.  I am not sure if she requires new sleep study - last one performed in March 2016. Will route to [place] to advise.

## 2015-02-25 NOTE — Telephone Encounter (Signed)
This patient will need appointment to see either Dr. Claiborne Billings for re evaluation. We need to have documentation that she still requires the machine for insurance purposes. She can be scheduled for a regular day. Not a sleep day.

## 2015-02-25 NOTE — Telephone Encounter (Signed)
Pt set up for return appt w/ Dr. Claiborne Billings for reevaluation.

## 2015-02-27 NOTE — Telephone Encounter (Signed)
ok 

## 2015-03-01 ENCOUNTER — Ambulatory Visit (INDEPENDENT_AMBULATORY_CARE_PROVIDER_SITE_OTHER): Payer: PRIVATE HEALTH INSURANCE | Admitting: Cardiovascular Disease

## 2015-03-01 VITALS — BP 112/78 | HR 72 | Ht 67.0 in | Wt 243.5 lb

## 2015-03-01 DIAGNOSIS — R002 Palpitations: Secondary | ICD-10-CM

## 2015-03-01 DIAGNOSIS — I10 Essential (primary) hypertension: Secondary | ICD-10-CM | POA: Diagnosis not present

## 2015-03-01 DIAGNOSIS — R0683 Snoring: Secondary | ICD-10-CM

## 2015-03-01 DIAGNOSIS — G4733 Obstructive sleep apnea (adult) (pediatric): Secondary | ICD-10-CM

## 2015-03-01 DIAGNOSIS — E669 Obesity, unspecified: Secondary | ICD-10-CM

## 2015-03-01 NOTE — Patient Instructions (Signed)
Your physician recommends that you schedule a follow-up appointment in: February sleep clinic.

## 2015-03-05 ENCOUNTER — Encounter: Payer: Self-pay | Admitting: Cardiovascular Disease

## 2015-03-05 NOTE — Progress Notes (Signed)
[name], female   DOB: 12/12/1965, 49 y.o.   MRN: [id number]     HPI: Summer Carpenter is a 49 y.o. female who is followed by Dr. Sallyanne Kuster for cardiology care.  She is now referred for a sleep evaluation, requesting reinitiation of CPAP therapy.  Summer Carpenter has a history of hypertension, obesity as well as palpitations.  In July 2014, she underwent a diagnostic polysomnogram to evaluate for sleep apnea and was found to have moderate obstructive sleep apnea with an AHI of 17.5 per hour but Summer Carpenter events were severe during rams sleep at 40.6 per hour.  She had significant oxygen desaturation to a nadir of 86% and had mild snoring.  She subsequently underwent CPAP titration and was recommended to initiate usher at 12 cm water pressure.  Apparently, she had only used CPAP for a very short duration and since she was noncompliant.  She turned in Summer Carpenter equipment and has been without CPAP therapy ever since.  Recently, she has noticed that she is awakening gasping for breath and choking.  She has an witnessed to have apneic spells.  She wakes up at least 5 times per night.  She contacted Summer Carpenter DME company, who recommended that she needed to have another sleep evaluation and prescription for therapy.  She presents for evaluation.  She admits to snoring.  Frequent awakenings.  Summer Carpenter sleep is nonrestorative.  She admits to hypersomnolence.  She denies restless legs.  Summer Carpenter Epworth Sleepiness Scale was recalculated and is positive for excessive daytime sleepiness as shown below.  Epworth Sleepiness Scale: Situation   Chance of Dozing/Sleeping (0 = never , 1 = slight chance , 2 = moderate chance , 3 = high chance )   sitting and reading 2   watching TV 2   sitting inactive in a public place 1   being a passenger in a motor vehicle for an hour or more 1   lying down in the afternoon 3   sitting and talking to someone 1   sitting quietly after lunch (no alcohol) 1   while stopped for a few  minutes in traffic as the driver 1   Total Score  12    Past Medical History  Diagnosis Date  . Hypertension   . Anxiety   . Hyperlipidemia   . GERD (gastroesophageal reflux disease)   . Prediabetes   . Palpitations   . Obesity   . Sleep apnea   . Sleep apnea     Past Surgical History  Procedure Laterality Date  . Cholecystectomy    . Doppler echocardiography  07/30/2000 [place]    EF 55-66%,lv normal  . Nm myocar perf wall motion  10/22/2011  . Cardiac catheterization  11/25/2001    norm. LV fx,norm. abd aorta, both renal arteries patent  . Oophorectomy  2008    lt  . Dilitation & currettage/hystroscopy with novasure ablation N/A 12/15/2014    Procedure: DILATATION & CURETTAGE/HYSTEROSCOPY WITH ENDOMETRIAL NOVASURE ABLATION;  Surgeon: Florian Buff, MD;  Location: [facility];  Service: Gynecology;  Laterality: N/A;  . Cervical polypectomy N/A 12/15/2014    Procedure: ENDOMETRIAL POLYPECTOMY;  Surgeon: Florian Buff, MD;  Location: [facility];  Service: Gynecology;  Laterality: N/A;    Allergies  Allergen Reactions  . Septra [Bactrim] Hives and Diarrhea    Current Outpatient Prescriptions  Medication Sig Dispense Refill  . aspirin EC 81 MG tablet Take 81 mg by mouth daily.      Marland Kitchen  hydrochlorothiazide (HYDRODIURIL) 25 MG tablet Take 12.5 mg by mouth daily.    . pantoprazole (PROTONIX) 40 MG tablet Take 1 tablet (40 mg total) by mouth daily. 30 tablet 1  . polyethylene glycol powder (GLYCOLAX/MIRALAX) powder Take 255 g by mouth once. Take one cap full daily daily dissolved in liquid.  If no effect in 3 days, take two capfuls daily. 500 g 0  . potassium chloride (K-DUR) 10 MEQ tablet Take 1 tablet (10 mEq total) by mouth daily. 90 tablet 1  . propranolol ER (INDERAL LA) 120 MG 24 hr capsule TAKE 2 CAPSULES (240 MG TOTAL) BY MOUTH DAILY. 180 capsule 1  . sertraline (ZOLOFT) 50 MG tablet Take 50 mg by mouth daily.    . simvastatin (ZOCOR) 10 MG tablet Take 10 mg by mouth at bedtime.      . sucralfate (CARAFATE) 1 G tablet Take 1 tablet by mouth 3 (three) times daily before meals.  0   No current facility-administered medications for this visit.    Social History   Social History  . Marital Status: Single    Spouse Name: N/A  . Number of Children: N/A  . Years of Education: N/A   Occupational History  . Not on file.   Social History Main Topics  . Smoking status: Never Smoker   . Smokeless tobacco: Never Used  . Alcohol Use: Yes     Comment: Occasionally, once a month  . Drug Use: No  . Sexual Activity: Yes    Birth Control/ Protection: Condom, None   Other Topics Concern  . Not on file   Social History Narrative    Family History  Problem Relation Age of Onset  . Hypertension Mother   . Heart attack Mother   . Seizures Father   . Hypertension Brother   . Hypertension Brother   . Hypertension Sister   . Early death Brother   . Hypertension Son      ROS General: Negative; No fevers, chills, or night sweats HEENT: Negative; No changes in vision or hearing, sinus congestion, difficulty swallowing Pulmonary: Negative; No cough, wheezing, shortness of breath, hemoptysis Cardiovascular: Negative; No chest pain, presyncope, syncope, palpatations GI: Negative; No nausea, vomiting, diarrhea, or abdominal pain GU: Negative; No dysuria, hematuria, or difficulty voiding Musculoskeletal: Negative; no myalgias, joint pain, or weakness Hematologic: Negative; no easy bruising, bleeding Endocrine: Negative; no heat/cold intolerance Neuro: Negative; no changes in balance, headaches Skin: Negative; No rashes or skin lesions Psychiatric: Negative; No behavioral problems, depression Sleep: See history of present illness; no bruxism, restless legs, hypnogognic hallucinations, no cataplexy   Physical Exam BP 112/78 mmHg  Pulse 72  Ht '5\' 7"'  (1.702 m)  Wt 243 lb 8 oz (110.451 kg)  BMI 38.13 kg/m2  LMP 01/26/2015   Wt Readings from Last 3 Encounters:    03/01/15 243 lb 8 oz (110.451 kg)  01/31/15 242 lb (109.77 kg)  01/28/15 240 lb (108.863 kg)   General: Alert, oriented, no distress.  Skin: normal turgor, no rashes HEENT: Normocephalic, atraumatic. Pupils round and reactive; sclera anicteric; extraocular muscles intact; Fundi without hemorrhages or exudates Nose without nasal septal hypertrophy Mouth/Parynx benign; Mallinpatti scale 3 Neck: No JVD, no carotid briuts Lungs: clear to ausculatation and percussion; no wheezing or rales  Chest wall: No tenderness to palpation Heart: RRR, s1 s2 normal  Abdomen: soft, nontender; no hepatosplenomehaly, BS+; abdominal aorta nontender and not dilated by palpation. Back: No CVA tenderness Pulses 2+ Extremities: no clubbinbg cyanosis or  edema, Homan's sign negative  Neurologic: grossly nonfocal; cranial nerves intact. Psychological: Normal affect and mood.  ECG (independently read by me): Normal sinus rhythm at 72 bpm with accelerated AV conduction and a short PR interval at 106 ms.  QTc interval 438 ms.  LABS:  BMP Latest Ref Rng 01/28/2015 01/13/2015 12/10/2014  Glucose 65 - 99 mg/dL 101(H) 104(H) 84  BUN 6 - 20 mg/dL '12 9 14  ' Creatinine 0.44 - 1.00 mg/dL 1.14(H) 1.06(H) 1.08(H)  Sodium 135 - 145 mmol/L 139 136 140  Potassium 3.5 - 5.1 mmol/L 3.2(L) 3.4(L) 3.4(L)  Chloride 101 - 111 mmol/L 100(L) 100(L) 106  CO2 22 - 32 mmol/L '30 27 24  ' Calcium 8.9 - 10.3 mg/dL 9.3 8.9 9.2     Hepatic Function Latest Ref Rng 01/28/2015 01/13/2015 12/10/2014  Total Protein 6.5 - 8.1 g/dL 7.9 8.0 7.9  Albumin 3.5 - 5.0 g/dL 4.1 4.1 3.9  AST 15 - 41 U/L '20 23 20  ' ALT 14 - 54 U/L '17 22 15  ' Alk Phosphatase 38 - 126 U/L 79 71 72  Total Bilirubin 0.3 - 1.2 mg/dL 0.8 0.6 0.6     CBC Latest Ref Rng 01/28/2015 01/13/2015 12/10/2014  WBC 4.0 - 10.5 K/uL 12.2(H) 10.5 11.9(H)  Hemoglobin 12.0 - 15.0 g/dL 10.9(L) 10.6(L) 10.8(L)  Hematocrit 36.0 - 46.0 % 34.6(L) 34.0(L) 33.9(L)  Platelets 150 - 400 K/uL 372  406(H) 470(H)     Lipid Panel  No results found for: CHOL, TRIG, HDL, CHOLHDL, VLDL, LDLCALC, LDLDIRECT   RADIOLOGY: No results found.    ASSESSMENT AND PLAN: Summer Carpenter is a 49 year old female who has a history of obesity with a body mass index of 38.13 kg/m, as well as a history of hypertension and palpitations.  She has accelerated AV conduction on Summer Carpenter ECG.  She has been demonstrated to have at least moderate sleep apnea overall, but Summer Carpenter sleep apnea was severe during REM sleep on Summer Carpenter diagnostic polysomnogram of 2 years ago.  She has become more symptomatic and has been awakening frequently, gasping for breath and feeling that she is choking at night.  She typically goes to bed at 10:30 AM and wakes up at 6:30, but often is up at least 5 times throughout the night.  I have written Summer Carpenter a new prescription for CPAP therapy.  I have reviewed with Summer Carpenter in detail Summer Carpenter previous diagnostic polysomnogram as well as Summer Carpenter CPAP titration study.  At present, I do not feel that a new sleep study is indicated.  We will re-initiate with a ResMed AirSense 10 auto unit with a minimum pressure of 8 and maximum up to 20 with EPR of 3.  I will see Summer Carpenter in follow-up in sleep clinic for further evaluation and to document that she is meeting compliance standards.  All questions were answered and Summer Carpenter medical records have been reviewed.  Time spent: 25 minutes  Troy Sine, MD, [facility]  03/05/2015 6:03 PM

## 2015-03-08 ENCOUNTER — Telehealth: Payer: Self-pay | Admitting: *Deleted

## 2015-03-08 NOTE — Telephone Encounter (Signed)
Sent CPAP referral to [facility]. 

## 2015-03-15 ENCOUNTER — Telehealth: Payer: Self-pay | Admitting: Cardiovascular Disease

## 2015-03-15 NOTE — Telephone Encounter (Signed)
Pt advised & voiced understanding

## 2015-03-15 NOTE — Telephone Encounter (Signed)
Went to EMS last night,her blood pressure was 180/110. Please call asap to advise,needs to know what she should do.Summer Carpenter

## 2015-03-15 NOTE — Telephone Encounter (Signed)
I agree we can wait until the 23rd. Please make sure she brings her blood pressure cuff with her when she comes.

## 2015-03-15 NOTE — Telephone Encounter (Signed)
Pt reports being lightheaded, dizzy on standing yesterday, BP low when checked on home cuff, [id number] systolic. After this, she went to [place] station and had them check w/ their cuff - reading was 180/110.   She admits being a little nervous/worked up going to first responders. She is unsure if either number was accurate. By this time she was not having the symptoms she'd experienced.  Pt reports feeling better today. She has not checked BP yet today. She wants to use drug store BP cuff since she is uncertain about the reliability of home cuff.  Advised we could do calibration/BP cuff check here in office. Pt has appt w/ Dr. Sallyanne Kuster on 12/23. She would elect to f/u then unless return of problems.  Informed pt I would route to Dr. Sallyanne Kuster for recommendation.

## 2015-03-16 ENCOUNTER — Telehealth: Payer: Self-pay | Admitting: *Deleted

## 2015-03-16 DIAGNOSIS — G4733 Obstructive sleep apnea (adult) (pediatric): Secondary | ICD-10-CM

## 2015-03-16 NOTE — Telephone Encounter (Signed)
Called and notified patient that she will need to have a sleep study. Her insurance will not cover new equipment since she stopped the use of it.

## 2015-03-28 ENCOUNTER — Other Ambulatory Visit: Payer: Self-pay | Admitting: *Deleted

## 2015-03-28 MED ORDER — PANTOPRAZOLE SODIUM 40 MG PO TBEC: 40.0000 mg | DELAYED_RELEASE_TABLET | Freq: Every day | ORAL | Status: DC

## 2015-04-01 ENCOUNTER — Ambulatory Visit (INDEPENDENT_AMBULATORY_CARE_PROVIDER_SITE_OTHER): Payer: PRIVATE HEALTH INSURANCE | Admitting: Cardiovascular Disease

## 2015-04-01 ENCOUNTER — Encounter: Payer: Self-pay | Admitting: Cardiovascular Disease

## 2015-04-01 VITALS — BP 130/82 | HR 63 | Ht 67.0 in | Wt 243.7 lb

## 2015-04-01 DIAGNOSIS — I10 Essential (primary) hypertension: Secondary | ICD-10-CM

## 2015-04-01 DIAGNOSIS — R002 Palpitations: Secondary | ICD-10-CM

## 2015-04-01 DIAGNOSIS — G4733 Obstructive sleep apnea (adult) (pediatric): Secondary | ICD-10-CM

## 2015-04-01 DIAGNOSIS — E785 Hyperlipidemia, unspecified: Secondary | ICD-10-CM

## 2015-04-01 DIAGNOSIS — Z79899 Other long term (current) drug therapy: Secondary | ICD-10-CM

## 2015-04-01 DIAGNOSIS — E669 Obesity, unspecified: Secondary | ICD-10-CM

## 2015-04-01 DIAGNOSIS — E876 Hypokalemia: Secondary | ICD-10-CM | POA: Diagnosis not present

## 2015-04-01 NOTE — Progress Notes (Signed)
[name], female   DOB: [birth year], 49 y.o.   MRN: [id number]    Cardiology Office Note    Date:  04/01/2015   ID:  Summer Carpenter, DOB Apr 06, 1966, MRN [id number]  PCP:  Pcp Not In System  Cardiologist:   Sanda Klein, MD   Chief complaint: palpitations and HBP follow up   History of Present Illness:  Summer Carpenter is a 49 y.o. female with palpitations that were often associated with hypokalemia, presumably diuretic related. Did not do well when we stopped HCTZ. Spironolactone added several months ago, but she did not refill (no clear side effects, I don't think she understood the purpose for it). Insurance took away he CPAP equipment due to noncompliance and she will have another study in February 2017 to be reinstated. (saw Dr. Claiborne Billings a few months ago).  Palpitations are currently very well controlled on propranolol and she no longer complains of fatigue from it.  Previous monitoring has not shown any evidence of serious arrhythmia. She has normal left ventricular size, wall thickness, systolic and diastolic function and no valvular abnormalities by echocardiography performed in September 2014. She is compliant with a statin for hyperlipidemia. Nebivolol was not as effective at symptom control.   Past Medical History  Diagnosis Date  . Hypertension   . Anxiety   . Hyperlipidemia   . GERD (gastroesophageal reflux disease)   . Prediabetes   . Palpitations   . Obesity   . Sleep apnea   . Sleep apnea     Past Surgical History  Procedure Laterality Date  . Cholecystectomy    . Doppler echocardiography  07/30/2000 [place]    EF 55-66%,lv normal  . Nm myocar perf wall motion  10/22/2011  . Cardiac catheterization  11/25/2001    norm. LV fx,norm. abd aorta, both renal arteries patent  . Oophorectomy  2008    lt  . Dilitation & currettage/hystroscopy with novasure ablation N/A 12/15/2014    Procedure: DILATATION & CURETTAGE/HYSTEROSCOPY  WITH ENDOMETRIAL NOVASURE ABLATION;  Surgeon: Florian Buff, MD;  Location: [facility];  Service: Gynecology;  Laterality: N/A;  . Cervical polypectomy N/A 12/15/2014    Procedure: ENDOMETRIAL POLYPECTOMY;  Surgeon: Florian Buff, MD;  Location: [facility];  Service: Gynecology;  Laterality: N/A;    Current Outpatient Prescriptions  Medication Sig Dispense Refill  . aspirin EC 81 MG tablet Take 81 mg by mouth daily.      Marland Kitchen lisinopril-hydrochlorothiazide (PRINZIDE,ZESTORETIC) 10-12.5 MG tablet Take 1 tablet by mouth daily.  1  . pantoprazole (PROTONIX) 40 MG tablet Take 1 tablet (40 mg total) by mouth daily. 90 tablet 3  . potassium chloride (K-DUR) 10 MEQ tablet Take 1 tablet (10 mEq total) by mouth daily. 90 tablet 1  . propranolol ER (INDERAL LA) 120 MG 24 hr capsule TAKE 2 CAPSULES (240 MG TOTAL) BY MOUTH DAILY. 180 capsule 1  . sertraline (ZOLOFT) 50 MG tablet Take 50 mg by mouth daily.    . simvastatin (ZOCOR) 10 MG tablet Take 10 mg by mouth at bedtime.     . sucralfate (CARAFATE) 1 G tablet Take 1 tablet by mouth 3 (three) times daily before meals.  0   No current facility-administered medications for this visit.    Allergies:   Septra   Social History   Social History  . Marital Status: Single    Spouse Name: N/A  . Number of Children: N/A  . Years of Education: N/A   Social History  Main Topics  . Smoking status: Never Smoker   . Smokeless tobacco: Never Used  . Alcohol Use: Yes     Comment: Occasionally, once a month  . Drug Use: No  . Sexual Activity: Yes    Birth Control/ Protection: Condom, None   Other Topics Concern  . None   Social History Narrative     Family History:  The patient's family history includes Early death in her brother; Heart attack in her mother; Hypertension in her brother, brother, mother, sister, and son; Seizures in her father.   ROS:   Please see the history of present illness.    Review of Systems  All other systems reviewed and are  negative.   PHYSICAL EXAM:   VS:  BP 130/82 mmHg  Pulse 63  Ht 5\' 7"  (1.702 m)  Wt 243 lb 11.2 oz (110.542 kg)  BMI 38.16 kg/m2  SpO2 99%   GEN: Well nourished, well developed, in no acute distress HEENT: normal Neck: no JVD, carotid bruits, or masses Cardiac: RRR; no murmurs, rubs, or gallops,no edema  Respiratory:  clear to auscultation bilaterally, normal work of breathing GI: soft, nontender, nondistended, + BS MS: no deformity or atrophy Skin: warm and dry, no rash Neuro:  Alert and Oriented x 3, Strength and sensation are intact Psych: euthymic mood, full affect  Wt Readings from Last 3 Encounters:  04/01/15 243 lb 11.2 oz (110.542 kg)  03/01/15 243 lb 8 oz (110.451 kg)  01/31/15 242 lb (109.77 kg)      Studies/Labs Reviewed:   EKG:  EKG is not ordered today.    Recent Labs: 07/14/2014: Magnesium 1.9; TSH 0.833 01/28/2015: ALT 17; BUN 12; Creatinine, Ser 1.14*; Hemoglobin 10.9*; Platelets 372; Potassium 3.2*; Sodium 139       ASSESSMENT:    1. Medication management   2. Essential hypertension   3. Hypokalemia   4. Palpitations   5. Hyperlipidemia   6. OSA (obstructive sleep apnea)   7. Obesity (BMI 35.0-39.9 without comorbidity) ([place])      PLAN:  In order of problems listed above:  1. Recheck labs 2. Good BP control 3. By most recent labs, K was still low. Has done poorly without HCTZ. CRecheck K level. Reconsider spironolactone if still low. 4. Currently not a problem 5. On statin, reports good results on last labs 6. Will need a repeat sleep study, scheduled for February 7. Weight loss would be highly beneficial, discussed diet and exercise.   Medication Adjustments/Labs and Tests Ordered: Current medicines are reviewed at length with the patient today.  Concerns regarding medicines are outlined above.  Medication changes, Labs and Tests ordered today are listed below. Patient Instructions  Your physician wants you to follow-up in: 1 Year. You  will receive a reminder letter in the mail two months in advance. If you don't receive a letter, please call our office to schedule the follow-up appointment.  Your physician recommends that you return for lab work in: [place] and Happy New Year!!     Mikael Spray, MD  04/01/2015 [facility] [address], [place], [place]  [address] Phone: [phone]; Fax: [phone]

## 2015-04-01 NOTE — Patient Instructions (Signed)
Your physician wants you to follow-up in: 1 Year. You will receive a reminder letter in the mail two months in advance. If you don't receive a letter, please call our office to schedule the follow-up appointment.  Your physician recommends that you return for lab work in: [place]   Merry Christmas and Happy New Year!!  

## 2015-04-12 ENCOUNTER — Telehealth: Payer: Self-pay | Admitting: Cardiovascular Disease

## 2015-04-12 NOTE — Telephone Encounter (Signed)
Please call,she says her blood pressure is running low. She says it is 120/62.

## 2015-04-12 NOTE — Telephone Encounter (Signed)
Spoke with pt, reassurance given that blood pressure is normal.

## 2015-05-15 ENCOUNTER — Emergency Department (HOSPITAL_COMMUNITY)
Admission: EM | Admit: 2015-05-15 | Discharge: 2015-05-15 | Disposition: A | Discharge status: Home / Self Care | Payer: PRIVATE HEALTH INSURANCE | Attending: Emergency Medicine | Admitting: Emergency Medicine

## 2015-05-15 ENCOUNTER — Encounter (HOSPITAL_COMMUNITY): Payer: Self-pay | Admitting: *Deleted

## 2015-05-15 ENCOUNTER — Emergency Department (HOSPITAL_COMMUNITY): Payer: PRIVATE HEALTH INSURANCE

## 2015-05-15 DIAGNOSIS — Z8669 Personal history of other diseases of the nervous system and sense organs: Secondary | ICD-10-CM | POA: Insufficient documentation

## 2015-05-15 DIAGNOSIS — Z9889 Other specified postprocedural states: Secondary | ICD-10-CM | POA: Insufficient documentation

## 2015-05-15 DIAGNOSIS — J40 Bronchitis, not specified as acute or chronic: Secondary | ICD-10-CM

## 2015-05-15 DIAGNOSIS — F419 Anxiety disorder, unspecified: Secondary | ICD-10-CM | POA: Insufficient documentation

## 2015-05-15 DIAGNOSIS — E785 Hyperlipidemia, unspecified: Secondary | ICD-10-CM | POA: Insufficient documentation

## 2015-05-15 DIAGNOSIS — Z7982 Long term (current) use of aspirin: Secondary | ICD-10-CM | POA: Insufficient documentation

## 2015-05-15 DIAGNOSIS — I10 Essential (primary) hypertension: Secondary | ICD-10-CM | POA: Insufficient documentation

## 2015-05-15 DIAGNOSIS — Z79899 Other long term (current) drug therapy: Secondary | ICD-10-CM | POA: Insufficient documentation

## 2015-05-15 DIAGNOSIS — E669 Obesity, unspecified: Secondary | ICD-10-CM | POA: Diagnosis not present

## 2015-05-15 DIAGNOSIS — R05 Cough: Secondary | ICD-10-CM | POA: Diagnosis present

## 2015-05-15 DIAGNOSIS — J45909 Unspecified asthma, uncomplicated: Secondary | ICD-10-CM | POA: Diagnosis not present

## 2015-05-15 DIAGNOSIS — K219 Gastro-esophageal reflux disease without esophagitis: Secondary | ICD-10-CM | POA: Diagnosis not present

## 2015-05-15 DIAGNOSIS — J019 Acute sinusitis, unspecified: Secondary | ICD-10-CM | POA: Diagnosis not present

## 2015-05-15 MED ORDER — ALBUTEROL SULFATE HFA 108 (90 BASE) MCG/ACT IN AERS: 1.0000 | INHALATION_SPRAY | Freq: Four times a day (QID) | RESPIRATORY_TRACT | Status: DC | PRN

## 2015-05-15 MED ORDER — AMOXICILLIN-POT CLAVULANATE 875-125 MG PO TABS: 1.0000 | ORAL_TABLET | Freq: Two times a day (BID) | ORAL | Status: DC

## 2015-05-15 NOTE — Discharge Instructions (Signed)
Acute Bronchitis Bronchitis is inflammation of the airways that extend from the windpipe into the lungs (bronchi). The inflammation often causes mucus to develop. This leads to a cough, which is the most common symptom of bronchitis.  In acute bronchitis, the condition usually develops suddenly and goes away over time, usually in a couple weeks. Smoking, allergies, and asthma can make bronchitis worse. Repeated episodes of bronchitis may cause further lung problems.  CAUSES Acute bronchitis is most often caused by the same virus that causes a cold. The virus can spread from person to person (contagious) through coughing, sneezing, and touching contaminated objects. SIGNS AND SYMPTOMS   Cough.   Fever.   Coughing up mucus.   Body aches.   Chest congestion.   Chills.   Shortness of breath.   Sore throat.  DIAGNOSIS  Acute bronchitis is usually diagnosed through a physical exam. Your health care provider will also ask you questions about your medical history. Tests, such as chest X-rays, are sometimes done to rule out other conditions.  TREATMENT  Acute bronchitis usually goes away in a couple weeks. Oftentimes, no medical treatment is necessary. Medicines are sometimes given for relief of fever or cough. Antibiotic medicines are usually not needed but may be prescribed in certain situations. In some cases, an inhaler may be recommended to help reduce shortness of breath and control the cough. A cool mist vaporizer may also be used to help thin bronchial secretions and make it easier to clear the chest.  HOME CARE INSTRUCTIONS  Get plenty of rest.   Drink enough fluids to keep your urine clear or pale yellow (unless you have a medical condition that requires fluid restriction). Increasing fluids may help thin your respiratory secretions (sputum) and reduce chest congestion, and it will prevent dehydration.   Take medicines only as directed by your health care provider.  If  you were prescribed an antibiotic medicine, finish it all even if you start to feel better.  Avoid smoking and secondhand smoke. Exposure to cigarette smoke or irritating chemicals will make bronchitis worse. If you are a smoker, consider using nicotine gum or skin patches to help control withdrawal symptoms. Quitting smoking will help your lungs heal faster.   Reduce the chances of another bout of acute bronchitis by washing your hands frequently, avoiding people with cold symptoms, and trying not to touch your hands to your mouth, nose, or eyes.   Keep all follow-up visits as directed by your health care provider.  SEEK MEDICAL CARE IF: Your symptoms do not improve after 1 week of treatment.  SEEK IMMEDIATE MEDICAL CARE IF:  You develop an increased fever or chills.   You have chest pain.   You have severe shortness of breath.  You have bloody sputum.   You develop dehydration.  You faint or repeatedly feel like you are going to pass out.  You develop repeated vomiting.  You develop a severe headache. MAKE SURE YOU:   Understand these instructions.  Will watch your condition.  Will get help right away if you are not doing well or get worse.   This information is not intended to replace advice given to you by your health care provider. Make sure you discuss any questions you have with your health care provider.   Document Released: 05/03/2004 Document Revised: 04/16/2014 Document Reviewed: 09/16/2012 Elsevier Interactive Patient Education 2016 [employer] Sinusitis, Adult Sinusitis is redness, soreness, and inflammation of the paranasal sinuses. Paranasal sinuses are air pockets within the   bones of your face. They are located beneath your eyes, in the middle of your forehead, and above your eyes. In healthy paranasal sinuses, mucus is able to drain out, and air is able to circulate through them by way of your nose. However, when your paranasal sinuses are inflamed,  mucus and air can become trapped. This can allow bacteria and other germs to grow and cause infection. Sinusitis can develop quickly and last only a short time (acute) or continue over a long period (chronic). Sinusitis that lasts for more than 12 weeks is considered chronic. CAUSES Causes of sinusitis include:  Allergies.  Structural abnormalities, such as displacement of the cartilage that separates your nostrils (deviated septum), which can decrease the air flow through your nose and sinuses and affect sinus drainage.  Functional abnormalities, such as when the small hairs (cilia) that line your sinuses and help remove mucus do not work properly or are not present. SIGNS AND SYMPTOMS Symptoms of acute and chronic sinusitis are the same. The primary symptoms are pain and pressure around the affected sinuses. Other symptoms include:  Upper toothache.  Earache.  Headache.  Bad breath.  Decreased sense of smell and taste.  A cough, which worsens when you are lying flat.  Fatigue.  Fever.  Thick drainage from your nose, which often is green and may contain pus (purulent).  Swelling and warmth over the affected sinuses. DIAGNOSIS Your health care provider will perform a physical exam. During your exam, your health care provider may perform any of the following to help determine if you have acute sinusitis or chronic sinusitis:  Look in your nose for signs of abnormal growths in your nostrils (nasal polyps).  Tap over the affected sinus to check for signs of infection.  View the inside of your sinuses using an imaging device that has a light attached (endoscope). If your health care provider suspects that you have chronic sinusitis, one or more of the following tests may be recommended:  Allergy tests.  Nasal culture. A sample of mucus is taken from your nose, sent to a lab, and screened for bacteria.  Nasal cytology. A sample of mucus is taken from your nose and examined by  your health care provider to determine if your sinusitis is related to an allergy. TREATMENT Most cases of acute sinusitis are related to a viral infection and will resolve on their own within 10 days. Sometimes, medicines are prescribed to help relieve symptoms of both acute and chronic sinusitis. These may include pain medicines, decongestants, nasal steroid sprays, or saline sprays. However, for sinusitis related to a bacterial infection, your health care provider will prescribe antibiotic medicines. These are medicines that will help kill the bacteria causing the infection. Rarely, sinusitis is caused by a fungal infection. In these cases, your health care provider will prescribe antifungal medicine. For some cases of chronic sinusitis, surgery is needed. Generally, these are cases in which sinusitis recurs more than 3 times per year, despite other treatments. HOME CARE INSTRUCTIONS  Drink plenty of water. Water helps thin the mucus so your sinuses can drain more easily.  Use a humidifier.  Inhale steam 3-4 times a day (for example, sit in the bathroom with the shower running).  Apply a warm, moist washcloth to your face 3-4 times a day, or as directed by your health care provider.  Use saline nasal sprays to help moisten and clean your sinuses.  Take medicines only as directed by your health care provider.  If   you were prescribed either an antibiotic or antifungal medicine, finish it all even if you start to feel better. SEEK IMMEDIATE MEDICAL CARE IF:  You have increasing pain or severe headaches.  You have nausea, vomiting, or drowsiness.  You have swelling around your face.  You have vision problems.  You have a stiff neck.  You have difficulty breathing.   This information is not intended to replace advice given to you by your health care provider. Make sure you discuss any questions you have with your health care provider.   Document Released: 03/26/2005 Document  Revised: 04/16/2014 Document Reviewed: 04/10/2011 Elsevier Interactive Patient Education 2016 Elsevier Inc[employer]  

## 2015-05-15 NOTE — ED Notes (Signed)
Pt has had a cough for around 2 weeks that is non-productive. In addition, she has nasal congestion. Pt has been taking OTC allergy medication with no relief. Pt denies any n/v/d.

## 2015-05-15 NOTE — ED Provider Notes (Signed)
CSN: [id number]     Arrival date & time 05/15/15  1056 History  By signing my name below, I, Starleen Arms, attest that this documentation has been prepared under the direction and in the presence of Marcene Brawn, PA-C. Electronically Signed: Starleen Arms ED Scribe. 05/15/2015. 11:13 AM.    Chief Complaint  Patient presents with  . Cough   The history is provided by the patient. No language interpreter was used.   HPI Comments: Summer Carpenter is a 50 y.o. female with hx of asthma, HTN who presents to the Emergency Department complaining of a persistent, dry cough onset 2 weeks ago that wakes her from sleep; unrelieved by OTC medications.  Associated symptoms include post-nasal drainage and chest congestion.  Patient has been seen by her ENT, Dr. Tamala Julian [facility]) who rx'd Zyrtec.  She has also tried an albuterol inhaler but believes the medication is expired.  Patient does not smoke.  She denies fever.  Patient reports an allergy to septra but no other  Past Medical History  Diagnosis Date  . Hypertension   . Anxiety   . Hyperlipidemia   . GERD (gastroesophageal reflux disease)   . Prediabetes   . Palpitations   . Obesity   . Sleep apnea   . Sleep apnea    Past Surgical History  Procedure Laterality Date  . Cholecystectomy    . Doppler echocardiography  07/30/2000 [place]    EF 55-66%,lv normal  . Nm myocar perf wall motion  10/22/2011  . Cardiac catheterization  11/25/2001    norm. LV fx,norm. abd aorta, both renal arteries patent  . Oophorectomy  2008    lt  . Dilitation & currettage/hystroscopy with novasure ablation N/A 12/15/2014    Procedure: DILATATION & CURETTAGE/HYSTEROSCOPY WITH ENDOMETRIAL NOVASURE ABLATION;  Surgeon: Florian Buff, MD;  Location: [facility];  Service: Gynecology;  Laterality: N/A;  . Cervical polypectomy N/A 12/15/2014    Procedure: ENDOMETRIAL POLYPECTOMY;  Surgeon: Florian Buff, MD;  Location: [facility];  Service: Gynecology;  Laterality: N/A;    Family History  Problem Relation Age of Onset  . Hypertension Mother   . Heart attack Mother   . Seizures Father   . Hypertension Brother   . Hypertension Brother   . Hypertension Sister   . Early death Brother   . Hypertension Son    Social History  Substance Use Topics  . Smoking status: Never Smoker   . Smokeless tobacco: Never Used  . Alcohol Use: Yes     Comment: Occasionally, once a month   OB History    Gravida Para Term Preterm AB TAB SAB Ectopic Multiple Living   6 3 3  3  3         Review of Systems A complete 10 system review of systems was obtained and all systems are negative except as noted in the HPI and PMH.   Allergies  Septra  Home Medications   Prior to Admission medications   Medication Sig Start Date End Date Taking? Authorizing Provider  aspirin EC 81 MG tablet Take 81 mg by mouth daily.      Historical Provider, MD  lisinopril-hydrochlorothiazide (PRINZIDE,ZESTORETIC) 10-12.5 MG tablet Take 1 tablet by mouth daily. 03/15/15   Historical Provider, MD  pantoprazole (PROTONIX) 40 MG tablet Take 1 tablet (40 mg total) by mouth daily. 03/28/15   Florian Buff, MD  potassium chloride (K-DUR) 10 MEQ tablet Take 1 tablet (10 mEq total) by mouth daily. 11/09/14  Mihai Croitoru, MD  propranolol ER (INDERAL LA) 120 MG 24 hr capsule TAKE 2 CAPSULES (240 MG TOTAL) BY MOUTH DAILY. 10/25/14   Mihai Croitoru, MD  sertraline (ZOLOFT) 50 MG tablet Take 50 mg by mouth daily.    Historical Provider, MD  simvastatin (ZOCOR) 10 MG tablet Take 10 mg by mouth at bedtime.     Historical Provider, MD  sucralfate (CARAFATE) 1 G tablet Take 1 tablet by mouth 3 (three) times daily before meals. 01/08/15   Historical Provider, MD   BP 142/82 mmHg  Pulse 70  Temp(Src) 98.6 F (37 C) (Oral)  Resp 16  Ht 5\' 7"  (1.702 m)  Wt 242 lb (109.77 kg)  BMI 37.89 kg/m2  SpO2 100% Physical Exam  Constitutional: She is oriented to person, place, and time. She appears well-developed and  well-nourished. No distress.  HENT:  Head: Normocephalic and atraumatic.  Eyes: Conjunctivae and EOM are normal.  Neck: Neck supple. No tracheal deviation present.  Cardiovascular: Normal rate.   Pulmonary/Chest: Effort normal. No respiratory distress.  Musculoskeletal: Normal range of motion.  Neurological: She is alert and oriented to person, place, and time.  Skin: Skin is warm and dry.  Psychiatric: She has a normal mood and affect. Her behavior is normal.  Nursing note and vitals reviewed.   ED Course  Procedures (including critical care time)  DIAGNOSTIC STUDIES: Oxygen Saturation is 100% on RA, normal by my interpretation.    COORDINATION OF CARE:  11:21 AM Will order CXR.  Patient acknowledges and agrees with plan.    Labs Review Labs Reviewed - No data to display  Imaging Review Dg Chest 2 View  05/15/2015  CLINICAL DATA:  Cough for 2 weeks. EXAM: CHEST  2 VIEW COMPARISON:  January 13, 2015. FINDINGS: The heart size and mediastinal contours are within normal limits. Both lungs are clear. No pneumothorax or pleural effusion is noted. The visualized skeletal structures are unremarkable. IMPRESSION: No active cardiopulmonary disease. Electronically Signed   By: Marijo Conception, M.D.   On: 05/15/2015 12:34   I have personally reviewed and evaluated these images and lab results as part of my medical decision-making.   EKG Interpretation None      MDM   Final diagnoses:  Bronchitis  Acute sinusitis, recurrence not specified, unspecified location    Meds ordered this encounter  Medications  . albuterol (PROVENTIL HFA;VENTOLIN HFA) 108 (90 Base) MCG/ACT inhaler    Sig: Inhale 1-2 puffs into the lungs every 6 (six) hours as needed for wheezing or shortness of breath.    Dispense:  1 Inhaler    Refill:  0    Order Specific Question:  Supervising Provider    Answer:  MILLER, BRIAN [[id number]]  . amoxicillin-clavulanate (AUGMENTIN) 875-125 MG tablet    Sig: Take 1 tablet  by mouth every 12 (twelve) hours.    Dispense:  14 tablet    Refill:  0    Order Specific Question:  Supervising Provider    Answer:  Noemi Chapel [[id number]]  An After Visit Summary was printed and given to the patient.  I personally performed the services in this documentation, which was scribed in my presence.  The recorded information has been reviewed and considered.   Ronnald Collum.  Hollace Kinnier [place], PA-C 05/15/15 [place], [place] 05/15/15 [phone]

## 2015-05-23 ENCOUNTER — Ambulatory Visit (HOSPITAL_BASED_OUTPATIENT_CLINIC_OR_DEPARTMENT_OTHER): Payer: PRIVATE HEALTH INSURANCE | Attending: Cardiovascular Disease | Admitting: Radiology

## 2015-05-23 DIAGNOSIS — R0683 Snoring: Secondary | ICD-10-CM | POA: Diagnosis not present

## 2015-05-23 DIAGNOSIS — Z79899 Other long term (current) drug therapy: Secondary | ICD-10-CM | POA: Diagnosis not present

## 2015-05-23 DIAGNOSIS — Z7982 Long term (current) use of aspirin: Secondary | ICD-10-CM | POA: Insufficient documentation

## 2015-05-23 DIAGNOSIS — G4733 Obstructive sleep apnea (adult) (pediatric): Secondary | ICD-10-CM | POA: Diagnosis not present

## 2015-05-23 DIAGNOSIS — R5383 Other fatigue: Secondary | ICD-10-CM | POA: Insufficient documentation

## 2015-05-23 DIAGNOSIS — G4719 Other hypersomnia: Secondary | ICD-10-CM | POA: Diagnosis not present

## 2015-05-23 DIAGNOSIS — Z6838 Body mass index (BMI) 38.0-38.9, adult: Secondary | ICD-10-CM | POA: Insufficient documentation

## 2015-05-23 DIAGNOSIS — R51 Headache: Secondary | ICD-10-CM | POA: Diagnosis not present

## 2015-05-23 DIAGNOSIS — E669 Obesity, unspecified: Secondary | ICD-10-CM | POA: Insufficient documentation

## 2015-05-23 DIAGNOSIS — I10 Essential (primary) hypertension: Secondary | ICD-10-CM | POA: Insufficient documentation

## 2015-05-24 ENCOUNTER — Ambulatory Visit: Payer: PRIVATE HEALTH INSURANCE | Admitting: Cardiovascular Disease

## 2015-05-28 ENCOUNTER — Emergency Department (HOSPITAL_COMMUNITY)
Admission: EM | Admit: 2015-05-28 | Discharge: 2015-05-28 | Disposition: A | Discharge status: Home / Self Care | Payer: PRIVATE HEALTH INSURANCE | Attending: Emergency Medicine | Admitting: Emergency Medicine

## 2015-05-28 ENCOUNTER — Encounter (HOSPITAL_COMMUNITY): Payer: Self-pay

## 2015-05-28 DIAGNOSIS — I10 Essential (primary) hypertension: Secondary | ICD-10-CM | POA: Diagnosis not present

## 2015-05-28 DIAGNOSIS — Z7952 Long term (current) use of systemic steroids: Secondary | ICD-10-CM | POA: Diagnosis not present

## 2015-05-28 DIAGNOSIS — Z7982 Long term (current) use of aspirin: Secondary | ICD-10-CM | POA: Insufficient documentation

## 2015-05-28 DIAGNOSIS — Z79899 Other long term (current) drug therapy: Secondary | ICD-10-CM | POA: Diagnosis not present

## 2015-05-28 DIAGNOSIS — F419 Anxiety disorder, unspecified: Secondary | ICD-10-CM | POA: Insufficient documentation

## 2015-05-28 DIAGNOSIS — J329 Chronic sinusitis, unspecified: Secondary | ICD-10-CM | POA: Diagnosis not present

## 2015-05-28 DIAGNOSIS — Z8669 Personal history of other diseases of the nervous system and sense organs: Secondary | ICD-10-CM | POA: Insufficient documentation

## 2015-05-28 DIAGNOSIS — E669 Obesity, unspecified: Secondary | ICD-10-CM | POA: Insufficient documentation

## 2015-05-28 DIAGNOSIS — Z9889 Other specified postprocedural states: Secondary | ICD-10-CM | POA: Insufficient documentation

## 2015-05-28 DIAGNOSIS — E785 Hyperlipidemia, unspecified: Secondary | ICD-10-CM | POA: Diagnosis not present

## 2015-05-28 DIAGNOSIS — R197 Diarrhea, unspecified: Secondary | ICD-10-CM | POA: Diagnosis not present

## 2015-05-28 DIAGNOSIS — K219 Gastro-esophageal reflux disease without esophagitis: Secondary | ICD-10-CM | POA: Insufficient documentation

## 2015-05-28 NOTE — ED Notes (Signed)
For the last 4 days patient has had generalized weakness and head pressure along with vomiting on average twice a day and diarrhea on average 4-5 times per day. Denies blood in vomit or stool  Took Imodium with minimal relief. On Tuesday seen physician and was diagnosed with sinus infection started in Amoxicillin and Prednisone which she states made her feel worse. States similar symptoms early January. Also states she has had a chronic productive cough with thick white tenacious secretions "for a long time".

## 2015-05-28 NOTE — Discharge Instructions (Signed)

## 2015-05-29 NOTE — ED Provider Notes (Signed)
CSN: [id number]     Arrival date & time 05/28/15  1244 History   First MD Initiated Contact with Patient 05/28/15 1445     No chief complaint on file.    (Consider location/radiation/quality/duration/timing/severity/associated sxs/prior Treatment) Patient is a 50 y.o. female presenting with cough. The history is provided by the patient. No language interpreter was used.  Cough Cough characteristics:  Non-productive Severity:  Moderate Onset quality:  Gradual Timing:  Constant Progression:  Worsening Chronicity:  New Smoker: no   Relieved by:  Nothing Worsened by:  Nothing tried Ineffective treatments:  None tried Associated symptoms: no shortness of breath   Risk factors: recent infection   Pt has chronic sinus disease.   Pt has been on multiple antibiotics. Pt reports 4 episodes of diarrhea yesterday.  One today.  Pt's ent stopped antibiotics.  Primary restarted.   Past Medical History  Diagnosis Date  . Hypertension   . Anxiety   . Hyperlipidemia   . GERD (gastroesophageal reflux disease)   . Prediabetes   . Palpitations   . Obesity   . Sleep apnea   . Sleep apnea    Past Surgical History  Procedure Laterality Date  . Cholecystectomy    . Doppler echocardiography  07/30/2000 [place]    EF 55-66%,lv normal  . Nm myocar perf wall motion  10/22/2011  . Cardiac catheterization  11/25/2001    norm. LV fx,norm. abd aorta, both renal arteries patent  . Oophorectomy  2008    lt  . Dilitation & currettage/hystroscopy with novasure ablation N/A 12/15/2014    Procedure: DILATATION & CURETTAGE/HYSTEROSCOPY WITH ENDOMETRIAL NOVASURE ABLATION;  Surgeon: Florian Buff, MD;  Location: [facility];  Service: Gynecology;  Laterality: N/A;  . Cervical polypectomy N/A 12/15/2014    Procedure: ENDOMETRIAL POLYPECTOMY;  Surgeon: Florian Buff, MD;  Location: [facility];  Service: Gynecology;  Laterality: N/A;   Family History  Problem Relation Age of Onset  . Hypertension Mother   . Heart  attack Mother   . Seizures Father   . Hypertension Brother   . Hypertension Brother   . Hypertension Sister   . Early death Brother   . Hypertension Son    Social History  Substance Use Topics  . Smoking status: Never Smoker   . Smokeless tobacco: Never Used  . Alcohol Use: Yes     Comment: Occasionally, once a month   OB History    Gravida Para Term Preterm AB TAB SAB Ectopic Multiple Living   6 3 3  3  3         Review of Systems  Respiratory: Positive for cough. Negative for shortness of breath.   All other systems reviewed and are negative.     Allergies  Septra  Home Medications   Prior to Admission medications   Medication Sig Start Date End Date Taking? Authorizing Provider  albuterol (PROVENTIL HFA;VENTOLIN HFA) 108 (90 Base) MCG/ACT inhaler Inhale 1-2 puffs into the lungs every 6 (six) hours as needed for wheezing or shortness of breath. 05/15/15  Yes Hollace Kinnier Lequisha Cammack, PA-C  amoxicillin-clavulanate (AUGMENTIN) 875-125 MG tablet Take 1 tablet by mouth every 12 (twelve) hours. 05/15/15  Yes Fransico Meadow, PA-C  aspirin EC 81 MG tablet Take 81 mg by mouth daily.     Yes Historical Provider, MD  lisinopril-hydrochlorothiazide (PRINZIDE,ZESTORETIC) 10-12.5 MG tablet Take 1 tablet by mouth daily. 03/15/15  Yes Historical Provider, MD  pantoprazole (PROTONIX) 40 MG tablet Take 1 tablet (40 mg total)  by mouth daily. 03/28/15  Yes Florian Buff, MD  potassium chloride (K-DUR) 10 MEQ tablet Take 1 tablet (10 mEq total) by mouth daily. 11/09/14  Yes Mihai Croitoru, MD  predniSONE (DELTASONE) 20 MG tablet Take 20 mg by mouth 2 (two) times daily with a meal.   Yes Historical Provider, MD  propranolol ER (INDERAL LA) 120 MG 24 hr capsule TAKE 2 CAPSULES (240 MG TOTAL) BY MOUTH DAILY. 10/25/14  Yes Mihai Croitoru, MD  sertraline (ZOLOFT) 50 MG tablet Take 50 mg by mouth daily.   Yes Historical Provider, MD  simvastatin (ZOCOR) 10 MG tablet Take 10 mg by mouth at bedtime.    Yes Historical  Provider, MD  sucralfate (CARAFATE) 1 G tablet Take 1 tablet by mouth 3 (three) times daily before meals. 01/08/15  Yes Historical Provider, MD   BP 143/70 mmHg  Pulse 75  Temp(Src) 98.9 F (37.2 C) (Oral)  Resp 16  Ht 5\' 7"  (1.702 m)  Wt 109.77 kg  BMI 37.89 kg/m2  SpO2 100% Physical Exam  Constitutional: She is oriented to person, place, and time. She appears well-developed and well-nourished.  HENT:  Head: Normocephalic.  Right Ear: External ear normal.  Left Ear: External ear normal.  Nose: Nose normal.  Mouth/Throat: Oropharynx is clear and moist.  Eyes: Conjunctivae and EOM are normal. Pupils are equal, round, and reactive to light.  Neck: Normal range of motion.  Cardiovascular: Normal rate.   Pulmonary/Chest: Effort normal and breath sounds normal.  Abdominal: Soft. She exhibits no distension.  Musculoskeletal: Normal range of motion.  Neurological: She is alert and oriented to person, place, and time.  Skin: Skin is warm.  Psychiatric: She has a normal mood and affect.  Nursing note and vitals reviewed.   ED Course  Procedures (including critical care time) Labs Review Labs Reviewed - No data to display  Imaging Review No results found. I have personally reviewed and evaluated these images and lab results as part of my medical decision-making.   EKG Interpretation None      MDM I don't think pt has c diff however I advised stop antibiotics.  Pt has chronic sinus disease and I don't think amoxicillian is helping and may be causing diarrhea.   Final diagnoses:  Diarrhea, unspecified type    Pt advised to stop amoxicillian and prednisone.  May take immodium.  See your MD on Monday for recheck    Fransico Meadow, PA-C 05/29/15 [place], [place] 05/29/15 1426  Noemi Chapel, MD 05/29/15 [phone]

## 2015-06-05 NOTE — Sleep Study (Addendum)
NAME: [name]  Patient Name: Summer Carpenter, Summer Carpenter Date: 05/23/2015 Gender: Female D.O.B: Aug 25, 1965 Age (years): 49 Referring Provider: Shelva Majestic MD, ABSM Height (inches): 75 Interpreting Physician: Shelva Majestic MD, ABSM Weight (lbs): 242 RPSGT: Carolin Coy BMI: 50 MRN: [id number] Neck Size: 15.00  CLINICAL INFORMATION Sleep Study Type: NPSG Indication for sleep study: Excessive Daytime Sleepiness, Fatigue, Hypertension, Morning Headaches, Obesity, OSA, Snoring.    Pt had had a prior study on 10/30/2012 with an AHI of 17.5 and was titrated to 12 cm water pressure on CPAP>  Epworth Sleepiness Score: 5  SLEEP STUDY TECHNIQUE As per the AASM Manual for the Scoring of Sleep and Associated Events v2.3 (April 2016) with a hypopnea requiring 4% desaturations. The channels recorded and monitored were frontal, central and occipital EEG, electrooculogram (EOG), submentalis EMG (chin), nasal and oral airflow, thoracic and abdominal wall motion, anterior tibialis EMG, snore microphone, electrocardiogram, and pulse oximetry.  MEDICATIONS   albuterol (PROVENTIL HFA;VENTOLIN HFA) 108 (90 Base) MCG/ACT inhaler 1-2 puff, Every 6 hours PRN     amoxicillin-clavulanate (AUGMENTIN) 875-125 MG tablet 1 tablet, Every 12 hours     aspirin EC 81 MG tablet 81 mg, Daily     lisinopril-hydrochlorothiazide (PRINZIDE,ZESTORETIC) 10-12.5 MG tablet 1 tablet, Daily     pantoprazole (PROTONIX) 40 MG tablet 40 mg, Daily     potassium chloride (K-DUR) 10 MEQ tablet 10 mEq, Daily     predniSONE (DELTASONE) 20 MG tablet 20 mg, 2 times daily with meals     propranolol ER (INDERAL LA) 120 MG 24 hr capsule      sertraline (ZOLOFT) 50 MG tablet 50 mg, Daily     simvastatin (ZOCOR) 10 MG tablet 10 mg, Daily at bedtime     sucralfate (CARAFATE) 1 G tablet 1 tablet, 3 times daily before meals   Medications self-administered by patient during sleep study : No sleep medicine  administered.  SLEEP ARCHITECTURE The study was initiated at 10:26:34 PM and ended at 5:01:15 AM. Sleep onset time was 6.8 minutes and the sleep efficiency was 86.4%. The total sleep time was 340.9 minutes. Wake after sleep onset (WASO) was 47 minutes. Stage REM latency was 323.5 minutes. The patient spent 20.64% of the night in stage N1 sleep, 66.74% in stage N2 sleep, 7.04% in stage N3 and 5.57% in REM. Alpha intrusion was absent. Supine sleep was 34.13%.  RESPIRATORY PARAMETERS The overall apnea/hypopnea index (AHI) was 3.2 per hour. There were 1 total apneas, including 1 obstructive, 0 central and 0 mixed apneas. There were 17 hypopneas and 67 RERAs. The AHI during Stage REM sleep was 22.1 per hour. AHI while supine was 2.1 per hour. The mean oxygen saturation was 95.74%. The minimum SpO2 during sleep was 89.00%. Moderate snoring was noted during this study.  CARDIAC DATA The 2 lead EKG demonstrated sinus rhythm. The mean heart rate was 78.88 beats per minute. Other EKG findings include: None.  LEG MOVEMENT DATA The total PLMS were 172 with a resulting PLMS index of 30.28. Associated arousal with leg movement index was 2.6 .  IMPRESSIONS - Increased Upper Airway resistance overall with an AHI = 3.2/h; however, moderate sleep apnea was present during REM sleep (AHI 22.1/h). - No significant central sleep apnea occurred during this study (CAI = 0.0/h). - The patient had minimal oxygen desaturation to a nadir of 89.00%. - The arousal index was abnornmal. - Abnormal sleep architecture with prolonged latency to REM sleep.  - The patient snored with Moderate  snoring volume. - No cardiac abnormalities were noted during this study. - Moderate periodic limb movements of sleep occurred during the study. No significant associated arousals.  DIAGNOSIS - Obstructive sleep apnea 327.23  RECOMMENDATIONS - At present patient does not meet criteria for CPAP, however, with an AHI of 22.1/h  during REM sleep, snoring and abnormal sleep architecture and prior moderate OSA on study of 10/2012  CPAP will be beneficial and may be necessary. - Recommend a follow up office visit to discuss initial alternatives to CPAP therapy versus CPAP therapy.  - Avoid alcohol, sedatives and other CNS depressants that may worsen sleep apnea and disrupt normal sleep architecture. - If patient is symptomatic with restless legs consider therapy with a PLMS index of 30.28. - Sleep hygiene should be reviewed to assess factors that may improve sleep quality. - Weight management and regular exercise should be initiated or continued if appropriate.   Troy Sine, MD, [place], American Board of Sleep Medicine  ELECTRONICALLY SIGNED ON:  06/05/2015, 1:00 PM [place] PH: [phone]   FX: [phone] [place]

## 2015-06-09 ENCOUNTER — Telehealth: Payer: Self-pay | Admitting: *Deleted

## 2015-06-09 ENCOUNTER — Other Ambulatory Visit: Payer: Self-pay | Admitting: Cardiovascular Disease

## 2015-06-09 NOTE — Telephone Encounter (Signed)
REFILL 

## 2015-06-09 NOTE — Progress Notes (Signed)
Patient notified of sleep study results and recommendations. 

## 2015-06-09 NOTE — Telephone Encounter (Signed)
Patient informed of sleep study results and recommendations. Appointment scheduled.

## 2015-06-11 ENCOUNTER — Emergency Department (HOSPITAL_COMMUNITY)
Admission: EM | Admit: 2015-06-11 | Discharge: 2015-06-12 | Disposition: A | Discharge status: Home / Self Care | Payer: PRIVATE HEALTH INSURANCE | Attending: Emergency Medicine | Admitting: Emergency Medicine

## 2015-06-11 ENCOUNTER — Encounter (HOSPITAL_COMMUNITY): Payer: Self-pay | Admitting: *Deleted

## 2015-06-11 ENCOUNTER — Emergency Department (HOSPITAL_COMMUNITY): Payer: PRIVATE HEALTH INSURANCE

## 2015-06-11 DIAGNOSIS — Z7952 Long term (current) use of systemic steroids: Secondary | ICD-10-CM | POA: Insufficient documentation

## 2015-06-11 DIAGNOSIS — M62830 Muscle spasm of back: Secondary | ICD-10-CM | POA: Insufficient documentation

## 2015-06-11 DIAGNOSIS — M542 Cervicalgia: Secondary | ICD-10-CM | POA: Diagnosis not present

## 2015-06-11 DIAGNOSIS — F419 Anxiety disorder, unspecified: Secondary | ICD-10-CM | POA: Insufficient documentation

## 2015-06-11 DIAGNOSIS — Z79899 Other long term (current) drug therapy: Secondary | ICD-10-CM | POA: Insufficient documentation

## 2015-06-11 DIAGNOSIS — Z8669 Personal history of other diseases of the nervous system and sense organs: Secondary | ICD-10-CM | POA: Insufficient documentation

## 2015-06-11 DIAGNOSIS — R079 Chest pain, unspecified: Secondary | ICD-10-CM | POA: Diagnosis present

## 2015-06-11 DIAGNOSIS — I10 Essential (primary) hypertension: Secondary | ICD-10-CM | POA: Diagnosis not present

## 2015-06-11 DIAGNOSIS — Z792 Long term (current) use of antibiotics: Secondary | ICD-10-CM | POA: Diagnosis not present

## 2015-06-11 DIAGNOSIS — K219 Gastro-esophageal reflux disease without esophagitis: Secondary | ICD-10-CM | POA: Diagnosis not present

## 2015-06-11 DIAGNOSIS — E669 Obesity, unspecified: Secondary | ICD-10-CM | POA: Insufficient documentation

## 2015-06-11 DIAGNOSIS — Z9889 Other specified postprocedural states: Secondary | ICD-10-CM | POA: Diagnosis not present

## 2015-06-11 DIAGNOSIS — E785 Hyperlipidemia, unspecified: Secondary | ICD-10-CM | POA: Diagnosis not present

## 2015-06-11 DIAGNOSIS — R0789 Other chest pain: Secondary | ICD-10-CM | POA: Diagnosis not present

## 2015-06-11 LAB — CBC WITH DIFFERENTIAL/PLATELET
BASOS ABS: 0 10*3/uL (ref 0.0–0.1)
BASOS PCT: 0 %
EOS PCT: 3 %
Eosinophils Absolute: 0.3 10*3/uL (ref 0.0–0.7)
HCT: 33.2 % — ABNORMAL LOW (ref 36.0–46.0)
Hemoglobin: 10.5 g/dL — ABNORMAL LOW (ref 12.0–15.0)
Lymphocytes Relative: 30 %
Lymphs Abs: 2.9 10*3/uL (ref 0.7–4.0)
MCH: 25.1 pg — ABNORMAL LOW (ref 26.0–34.0)
MCHC: 31.6 g/dL (ref 30.0–36.0)
MCV: 79.4 fL (ref 78.0–100.0)
MONO ABS: 0.7 10*3/uL (ref 0.1–1.0)
Monocytes Relative: 8 %
Neutro Abs: 5.7 10*3/uL (ref 1.7–7.7)
Neutrophils Relative %: 59 %
PLATELETS: 353 10*3/uL (ref 150–400)
RBC: 4.18 MIL/uL (ref 3.87–5.11)
RDW: 16.9 % — AB (ref 11.5–15.5)
WBC: 9.5 10*3/uL (ref 4.0–10.5)

## 2015-06-11 LAB — BASIC METABOLIC PANEL
Anion gap: 5 (ref 5–15)
BUN: 11 mg/dL (ref 6–20)
CO2: 27 mmol/L (ref 22–32)
CREATININE: 0.98 mg/dL (ref 0.44–1.00)
Calcium: 8.9 mg/dL (ref 8.9–10.3)
Chloride: 106 mmol/L (ref 101–111)
GFR calc Af Amer: >60 mL/min (ref >60)
Glucose, Bld: 101 mg/dL — ABNORMAL HIGH (ref 65–99)
POTASSIUM: 3.6 mmol/L (ref 3.5–5.1)
SODIUM: 138 mmol/L (ref 135–145)

## 2015-06-11 LAB — TROPONIN I

## 2015-06-11 NOTE — ED Notes (Signed)
Pt states she began having a pinching pain in the center of her chest and left arm pain yesterday. Pt states this pain happens for a couple of seconds and then goes away. Pt denies any other symptoms.

## 2015-06-11 NOTE — ED Provider Notes (Signed)
CSN: [id number]     Arrival date & time 06/11/15  2038 History   First MD Initiated Contact with Patient 06/11/15 2122     Chief Complaint  Patient presents with  . Chest Pain      HPI Pt was seen at 2125. Per pt, c/o gradual onset and persistence of multiple intermittent episodes of left sided chest wall "pain" that began yesterday. Pt describes the pain as "a tingle that lasts only a few seconds and goes away." Pain worsens with palpation of the area, left arm movement, and body position changes. Pt also states she has had "a muscle spasm" in her left trapezius muscle for the past few days. Denies palpitations, no SOB/cough, no abd pain, no N/V/D, no back pain, no injury, no fevers, no rash.    Past Medical History  Diagnosis Date  . Hypertension   . Anxiety   . Hyperlipidemia   . GERD (gastroesophageal reflux disease)   . Prediabetes   . Palpitations   . Obesity   . Sleep apnea   . Sleep apnea    Past Surgical History  Procedure Laterality Date  . Cholecystectomy    . Doppler echocardiography  07/30/2000 [place]    EF 55-66%,lv normal  . Nm myocar perf wall motion  10/22/2011  . Cardiac catheterization  11/25/2001    norm. LV fx,norm. abd aorta, both renal arteries patent  . Oophorectomy  2008    lt  . Dilitation & currettage/hystroscopy with novasure ablation N/A 12/15/2014    Procedure: DILATATION & CURETTAGE/HYSTEROSCOPY WITH ENDOMETRIAL NOVASURE ABLATION;  Surgeon: Florian Buff, MD;  Location: [facility];  Service: Gynecology;  Laterality: N/A;  . Cervical polypectomy N/A 12/15/2014    Procedure: ENDOMETRIAL POLYPECTOMY;  Surgeon: Florian Buff, MD;  Location: [facility];  Service: Gynecology;  Laterality: N/A;   Family History  Problem Relation Age of Onset  . Hypertension Mother   . Heart attack Mother   . Seizures Father   . Hypertension Brother   . Hypertension Brother   . Hypertension Sister   . Early death Brother   . Hypertension Son    Social History   Substance Use Topics  . Smoking status: Never Smoker   . Smokeless tobacco: Never Used  . Alcohol Use: Yes     Comment: Occasionally, once a month   OB History    Gravida Para Term Preterm AB TAB SAB Ectopic Multiple Living   6 3 3  3  3         Review of Systems ROS: Statement: All systems negative except as marked or noted in the HPI; Constitutional: Negative for fever and chills. ; ; Eyes: Negative for eye pain, redness and discharge. ; ; ENMT: Negative for ear pain, hoarseness, nasal congestion, sinus pressure and sore throat. ; ; Cardiovascular: Negative for palpitations, diaphoresis, dyspnea and peripheral edema. ; ; Respiratory: Negative for cough, wheezing and stridor. ; ; Gastrointestinal: Negative for nausea, vomiting, diarrhea, abdominal pain, blood in stool, hematemesis, jaundice and rectal bleeding. . ; ; Genitourinary: Negative for dysuria, flank pain and hematuria. ; ; Musculoskeletal: +neck pain, chest wall pain. Negative for back pain. Negative for swelling and trauma.; ; Skin: Negative for pruritus, rash, abrasions, blisters, bruising and skin lesion.; ; Neuro: Negative for headache, lightheadedness and neck stiffness. Negative for weakness, altered level of consciousness , altered mental status, extremity weakness, paresthesias, involuntary movement, seizure and syncope.      Allergies  Septra  Home Medications  Prior to Admission medications   Medication Sig Start Date End Date Taking? Authorizing Provider  albuterol (PROVENTIL HFA;VENTOLIN HFA) 108 (90 Base) MCG/ACT inhaler Inhale 1-2 puffs into the lungs every 6 (six) hours as needed for wheezing or shortness of breath. 05/15/15   Fransico Meadow, PA-C  amoxicillin-clavulanate (AUGMENTIN) 875-125 MG tablet Take 1 tablet by mouth every 12 (twelve) hours. 05/15/15   Fransico Meadow, PA-C  aspirin EC 81 MG tablet Take 81 mg by mouth daily.      Historical Provider, MD  KLOR-CON M10 10 MEQ tablet TAKE ONE CAPSULE BY MOUTH  EVERY DAY 06/09/15   Mihai Croitoru, MD  lisinopril-hydrochlorothiazide (PRINZIDE,ZESTORETIC) 10-12.5 MG tablet Take 1 tablet by mouth daily. 03/15/15   Historical Provider, MD  pantoprazole (PROTONIX) 40 MG tablet Take 1 tablet (40 mg total) by mouth daily. 03/28/15   Florian Buff, MD  predniSONE (DELTASONE) 20 MG tablet Take 20 mg by mouth 2 (two) times daily with a meal.    Historical Provider, MD  propranolol ER (INDERAL LA) 120 MG 24 hr capsule TAKE 2 CAPSULES (240 MG TOTAL) BY MOUTH DAILY. 10/25/14   Mihai Croitoru, MD  sertraline (ZOLOFT) 50 MG tablet Take 50 mg by mouth daily.    Historical Provider, MD  simvastatin (ZOCOR) 10 MG tablet Take 10 mg by mouth at bedtime.     Historical Provider, MD  sucralfate (CARAFATE) 1 G tablet Take 1 tablet by mouth 3 (three) times daily before meals. 01/08/15   Historical Provider, MD   BP 148/78 mmHg  Pulse 66  Temp(Src) 98.3 F (36.8 C) (Oral)  Resp 18  Ht 5\' 7"  (1.702 m)  Wt 240 lb (108.863 kg)  BMI 37.58 kg/m2  SpO2 100%  LMP 05/19/2015 Physical Exam  2130: Physical examination:  Nursing notes reviewed; Vital signs and O2 SAT reviewed;  Constitutional: Well developed, Well nourished, Well hydrated, In no acute distress; Head:  Normocephalic, atraumatic; Eyes: EOMI, PERRL, No scleral icterus; ENMT: Mouth and pharynx normal, Mucous membranes moist; Neck: Supple, Full range of motion, No lymphadenopathy; Cardiovascular: Regular rate and rhythm, No murmur, rub, or gallop; Respiratory: Breath sounds clear & equal bilaterally, No wheezes.  Speaking full sentences with ease, Normal respiratory effort/excursion; Chest: +left upper parasternal and anterior chest wall areas tender to palp. No rash, no soft tissue crepitus, no deformity. Movement normal; Abdomen: Soft, Nontender, Nondistended, Normal bowel sounds; Genitourinary: No CVA tenderness; Spine:  No midline CS, TS, LS tenderness. +TTP left hypertonic trapezius muscle. No rash.;; Extremities: Pulses  normal, No tenderness, No edema, No calf edema or asymmetry.; Neuro: AA&Ox3, Major CN grossly intact.  Speech clear. No gross focal motor or sensory deficits in extremities.; Skin: Color normal, Warm, Dry.   ED Course  Procedures (including critical care time) Labs Review  Imaging Review  I have personally reviewed and evaluated these images and lab results as part of my medical decision-making.   EKG Interpretation   Date/Time:  Saturday June 11 2015 20:50:41 EST Ventricular Rate:  65 PR Interval:  136 QRS Duration: 100 QT Interval:  412 QTC Calculation: 428 R Axis:   30 Text Interpretation:  Normal sinus rhythm Normal ECG When compared with  ECG of 08/07/2014 No significant change was found Confirmed by [facility]  MD,  Nunzio Cory [id number]) on 06/11/2015 9:40:23 PM      MDM  MDM Reviewed: previous chart, nursing note and vitals Reviewed previous: labs and ECG Interpretation: labs, ECG and x-ray     Results  for orders placed or performed during the hospital encounter of [id number]  Basic metabolic panel  Result Value Ref Range   Sodium 138 135 - 145 mmol/L   Potassium 3.6 3.5 - 5.1 mmol/L   Chloride 106 101 - 111 mmol/L   CO2 27 22 - 32 mmol/L   Glucose, Bld 101 (H) 65 - 99 mg/dL   BUN 11 6 - 20 mg/dL   Creatinine, Ser 0.98 0.44 - 1.00 mg/dL   Calcium 8.9 8.9 - 10.3 mg/dL   GFR calc non Af Amer >60 >60 mL/min   GFR calc Af Amer >60 >60 mL/min   Anion gap 5 5 - 15  Troponin I  Result Value Ref Range   Troponin I <0.03 <0.031 ng/mL  CBC with Differential  Result Value Ref Range   WBC 9.5 4.0 - 10.5 K/uL   RBC 4.18 3.87 - 5.11 MIL/uL   Hemoglobin 10.5 (L) 12.0 - 15.0 g/dL   HCT 33.2 (L) 36.0 - 46.0 %   MCV 79.4 78.0 - 100.0 fL   MCH 25.1 (L) 26.0 - 34.0 pg   MCHC 31.6 30.0 - 36.0 g/dL   RDW 16.9 (H) 11.5 - 15.5 %   Platelets 353 150 - 400 K/uL   Neutrophils Relative % 59 %   Neutro Abs 5.7 1.7 - 7.7 K/uL   Lymphocytes Relative 30 %   Lymphs Abs 2.9 0.7 - 4.0 K/uL    Monocytes Relative 8 %   Monocytes Absolute 0.7 0.1 - 1.0 K/uL   Eosinophils Relative 3 %   Eosinophils Absolute 0.3 0.0 - 0.7 K/uL   Basophils Relative 0 %   Basophils Absolute 0.0 0.0 - 0.1 K/uL   Dg Chest 2 View 06/11/2015  CLINICAL DATA:  Chest pain radiating to left arm since yesterday. Palpitations. Hyperlipidemia. Obesity. EXAM: CHEST  2 VIEW COMPARISON:  05/15/2015 FINDINGS: The heart size and mediastinal contours are within normal limits. Both lungs are clear. The visualized skeletal structures are unremarkable. IMPRESSION: No active cardiopulmonary disease. Electronically Signed   By: Earle Gell M.D.   On: 06/11/2015 21:22    2350:  H/H per baseline. Doubt PE as cause for symptoms with low risk [place].  Doubt ACS as cause for symptoms with normal troponin and unchanged EKG from previous after 2 days of atypical symptoms. Tx symptomatically at this time. Dx and testing d/w pt.  Questions answered.  Verb understanding, agreeable to d/c home with outpt f/u.     Francine Graven, DO 06/15/15 [phone]

## 2015-06-12 MED ORDER — HYDROCODONE-ACETAMINOPHEN 5-325 MG PO TABS: ORAL_TABLET | ORAL | Status: DC

## 2015-06-12 MED ORDER — METHOCARBAMOL 500 MG PO TABS: 1000.0000 mg | ORAL_TABLET | Freq: Four times a day (QID) | ORAL | Status: DC | PRN

## 2015-06-12 NOTE — Discharge Instructions (Signed)
Take the prescriptions as directed.  Apply moist heat or ice to the area(s) of discomfort, for 15 minutes at a time, several times per day for the next few days.  Do not fall asleep on a heating or ice pack.  Call your regular medical doctor on Monday to schedule a follow up appointment in the next 2 days.  Return to the Emergency Department immediately if worsening.

## 2015-06-24 ENCOUNTER — Telehealth: Payer: Self-pay | Admitting: Cardiovascular Disease

## 2015-06-24 NOTE — Telephone Encounter (Signed)
New MEssage  Pt stated her BP for the last couple weeks her BP has been low- running at around 117/55. Please call back and discuss.

## 2015-06-24 NOTE — Telephone Encounter (Signed)
Returned call. Pt having BPs of 101/60, 102/64. She denies symptoms or concerns. Recounts her daily meds, asked if changes to propranolol or lisinopril recommended. Advised BPs low normal -since no symptoms, leave medications as is. Pt aware to call if new problems.

## 2015-07-12 ENCOUNTER — Encounter (HOSPITAL_COMMUNITY): Payer: Self-pay | Admitting: Emergency Medicine

## 2015-07-12 ENCOUNTER — Emergency Department (HOSPITAL_COMMUNITY): Payer: PRIVATE HEALTH INSURANCE

## 2015-07-12 ENCOUNTER — Emergency Department (HOSPITAL_COMMUNITY)
Admission: EM | Admit: 2015-07-12 | Discharge: 2015-07-12 | Disposition: A | Discharge status: Home / Self Care | Payer: PRIVATE HEALTH INSURANCE | Attending: Emergency Medicine | Admitting: Emergency Medicine

## 2015-07-12 DIAGNOSIS — N644 Mastodynia: Secondary | ICD-10-CM | POA: Diagnosis present

## 2015-07-12 DIAGNOSIS — R079 Chest pain, unspecified: Secondary | ICD-10-CM | POA: Diagnosis not present

## 2015-07-12 DIAGNOSIS — E669 Obesity, unspecified: Secondary | ICD-10-CM | POA: Diagnosis not present

## 2015-07-12 DIAGNOSIS — I10 Essential (primary) hypertension: Secondary | ICD-10-CM | POA: Diagnosis not present

## 2015-07-12 DIAGNOSIS — Z7982 Long term (current) use of aspirin: Secondary | ICD-10-CM | POA: Diagnosis not present

## 2015-07-12 DIAGNOSIS — E785 Hyperlipidemia, unspecified: Secondary | ICD-10-CM | POA: Insufficient documentation

## 2015-07-12 DIAGNOSIS — Z79899 Other long term (current) drug therapy: Secondary | ICD-10-CM | POA: Insufficient documentation

## 2015-07-12 LAB — CBC
HEMATOCRIT: 32.3 % — AB (ref 36.0–46.0)
HEMOGLOBIN: 10.2 g/dL — AB (ref 12.0–15.0)
MCH: 25.5 pg — AB (ref 26.0–34.0)
MCHC: 31.6 g/dL (ref 30.0–36.0)
MCV: 80.8 fL (ref 78.0–100.0)
Platelets: 402 10*3/uL — ABNORMAL HIGH (ref 150–400)
RBC: 4 MIL/uL (ref 3.87–5.11)
RDW: 17 % — ABNORMAL HIGH (ref 11.5–15.5)
WBC: 10.4 10*3/uL (ref 4.0–10.5)

## 2015-07-12 LAB — COMPREHENSIVE METABOLIC PANEL
ALBUMIN: 4 g/dL (ref 3.5–5.0)
ALT: 17 U/L (ref 14–54)
AST: 22 U/L (ref 15–41)
Alkaline Phosphatase: 69 U/L (ref 38–126)
Anion gap: 8 (ref 5–15)
BUN: 14 mg/dL (ref 6–20)
CHLORIDE: 104 mmol/L (ref 101–111)
CO2: 28 mmol/L (ref 22–32)
CREATININE: 1.13 mg/dL — AB (ref 0.44–1.00)
Calcium: 8.6 mg/dL — ABNORMAL LOW (ref 8.9–10.3)
GFR calc non Af Amer: 56 mL/min — ABNORMAL LOW (ref >60)
Glucose, Bld: 90 mg/dL (ref 65–99)
Potassium: 3.3 mmol/L — ABNORMAL LOW (ref 3.5–5.1)
Sodium: 140 mmol/L (ref 135–145)
Total Bilirubin: 0.8 mg/dL (ref 0.3–1.2)
Total Protein: 7.7 g/dL (ref 6.5–8.1)

## 2015-07-12 LAB — I-STAT TROPONIN, ED: Troponin i, poc: 0 ng/mL (ref 0.00–0.08)

## 2015-07-12 LAB — PROTIME-INR
INR: 0.96 (ref 0.00–1.49)
Prothrombin Time: 13 seconds (ref 11.6–15.2)

## 2015-07-12 MED ORDER — ASPIRIN 81 MG PO CHEW
324.0000 mg | CHEWABLE_TABLET | Freq: Once | ORAL | Status: AC
  Administered 2015-07-12: 324 mg via ORAL
  Filled 2015-07-12: qty 4

## 2015-07-12 MED ORDER — SODIUM CHLORIDE 0.9 % IV SOLN
INTRAVENOUS | Status: DC
  Administered 2015-07-12: 19:00:00 via INTRAVENOUS

## 2015-07-12 MED ORDER — NAPROXEN 500 MG PO TABS: 500.0000 mg | ORAL_TABLET | Freq: Two times a day (BID) | ORAL | Status: DC

## 2015-07-12 NOTE — ED Provider Notes (Signed)
CSN: [id number]     Arrival date & time 07/12/15  1607 History   First MD Initiated Contact with Patient 07/12/15 1738     Chief Complaint  Patient presents with  . Breast Pain   HPI Patient presents to the emergency room with complaints of pain beneath her left breast that started about 2 days ago. It  has been constant but waxing and waning. It is aching and sometimes sharp. It was initially above her breast but now the pain seems to be so below the left breast. She denies any trouble with shortness of breath. She has had some intermittent coughing. She denies any fevers or chills. No vomiting or diarrhea. She called her primary doctors instructed to come to the emergency room. Patient had a similar episode of chest pain approximately 1 month ago. She was seen in the emergency room. She had a reassuring exam. She was treated with medications for muscle spasm and her symptoms all resolved. Past Medical History  Diagnosis Date  . Hypertension   . Anxiety   . Hyperlipidemia   . GERD (gastroesophageal reflux disease)   . Prediabetes   . Palpitations   . Obesity   . Sleep apnea   . Sleep apnea    Past Surgical History  Procedure Laterality Date  . Cholecystectomy    . Doppler echocardiography  07/30/2000 [place]    EF 55-66%,lv normal  . Nm myocar perf wall motion  10/22/2011  . Cardiac catheterization  11/25/2001    norm. LV fx,norm. abd aorta, both renal arteries patent  . Oophorectomy  2008    lt  . Dilitation & currettage/hystroscopy with novasure ablation N/A 12/15/2014    Procedure: DILATATION & CURETTAGE/HYSTEROSCOPY WITH ENDOMETRIAL NOVASURE ABLATION;  Surgeon: Florian Buff, MD;  Location: [facility];  Service: Gynecology;  Laterality: N/A;  . Cervical polypectomy N/A 12/15/2014    Procedure: ENDOMETRIAL POLYPECTOMY;  Surgeon: Florian Buff, MD;  Location: [facility];  Service: Gynecology;  Laterality: N/A;   Family History  Problem Relation Age of Onset  . Hypertension Mother   .  Heart attack Mother   . Seizures Father   . Hypertension Brother   . Hypertension Brother   . Hypertension Sister   . Early death Brother   . Hypertension Son    Social History  Substance Use Topics  . Smoking status: Never Smoker   . Smokeless tobacco: Never Used  . Alcohol Use: Yes     Comment: Occasionally, once a month   OB History    Gravida Para Term Preterm AB TAB SAB Ectopic Multiple Living   6 3 3  3  3         Review of Systems  All other systems reviewed and are negative.     Allergies  Septra  Home Medications   Prior to Admission medications   Medication Sig Start Date End Date Taking? Authorizing Provider  acetaminophen (TYLENOL) 500 MG tablet Take 500 mg by mouth every 6 (six) hours as needed for mild pain or moderate pain.   Yes Historical Provider, MD  albuterol (PROVENTIL HFA;VENTOLIN HFA) 108 (90 Base) MCG/ACT inhaler Inhale 1-2 puffs into the lungs every 6 (six) hours as needed for wheezing or shortness of breath. 05/15/15  Yes Fransico Meadow, PA-C  aspirin EC 81 MG tablet Take 81 mg by mouth daily.     Yes Historical Provider, MD  azelastine (ASTELIN) 0.1 % nasal spray SPRAY 2 SPRAYS IN EACH NOSTRIL TWICE A  DAY AS NEEDED FOR ALLERGIES/CONGESTION 04/14/15  Yes Historical Provider, MD  KLOR-CON M10 10 MEQ tablet TAKE ONE CAPSULE BY MOUTH EVERY DAY 06/09/15  Yes Mihai Croitoru, MD  lisinopril-hydrochlorothiazide (PRINZIDE,ZESTORETIC) 10-12.5 MG tablet Take 1 tablet by mouth daily. 03/15/15  Yes Historical Provider, MD  pantoprazole (PROTONIX) 40 MG tablet Take 1 tablet (40 mg total) by mouth daily. 03/28/15  Yes Florian Buff, MD  propranolol ER (INDERAL LA) 120 MG 24 hr capsule TAKE 2 CAPSULES (240 MG TOTAL) BY MOUTH DAILY. 10/25/14  Yes Mihai Croitoru, MD  sertraline (ZOLOFT) 50 MG tablet Take 50 mg by mouth every evening.    Yes Historical Provider, MD  simvastatin (ZOCOR) 10 MG tablet Take 10 mg by mouth at bedtime.    Yes Historical Provider, MD  sucralfate  (CARAFATE) 1 G tablet Take 1 tablet by mouth 3 (three) times daily before meals. 01/08/15  Yes Historical Provider, MD  methocarbamol (ROBAXIN) 500 MG tablet Take 2 tablets (1,000 mg total) by mouth 4 (four) times daily as needed for muscle spasms (muscle spasm/pain). Patient not taking: Reported on 07/12/2015 06/12/15   Francine Graven, DO  naproxen (NAPROSYN) 500 MG tablet Take 1 tablet (500 mg total) by mouth 2 (two) times daily with a meal. As needed for pain 07/12/15   Dorie Rank, MD   BP 122/73 mmHg  Pulse 78  Temp(Src) 98.3 F (36.8 C) (Oral)  Resp 25  Ht 5\' 7"  (1.702 m)  Wt 108.863 kg  BMI 37.58 kg/m2  SpO2 100%  LMP 06/11/2015 Physical Exam  Constitutional: She appears well-developed and well-nourished. No distress.  HENT:  Head: Normocephalic and atraumatic.  Right Ear: External ear normal.  Left Ear: External ear normal.  Eyes: Conjunctivae are normal. Right eye exhibits no discharge. Left eye exhibits no discharge. No scleral icterus.  Neck: Neck supple. No tracheal deviation present.  Cardiovascular: Normal rate, regular rhythm and intact distal pulses.   Pulmonary/Chest: Effort normal and breath sounds normal. No stridor. No respiratory distress. She has no wheezes. She has no rales.  Breast exam was performed with a chaperone, no induration or erythema of the breast, no mass or tenderness; patient does have tenderness to palpation along the chest wall  Abdominal: Soft. Bowel sounds are normal. She exhibits no distension. There is no tenderness. There is no rebound and no guarding.  Musculoskeletal: She exhibits no edema or tenderness.  Neurological: She is alert. She has normal strength. No cranial nerve deficit (no facial droop, extraocular movements intact, no slurred speech) or sensory deficit. She exhibits normal muscle tone. She displays no seizure activity. Coordination normal.  Skin: Skin is warm and dry. No rash noted.  Psychiatric: She has a normal mood and affect.   Nursing note and vitals reviewed.   ED Course  Procedures (including critical care time) Labs Review Labs Reviewed  CBC - Abnormal; Notable for the following:    Hemoglobin 10.2 (*)    HCT 32.3 (*)    MCH 25.5 (*)    RDW 17.0 (*)    Platelets 402 (*)    All other components within normal limits  COMPREHENSIVE METABOLIC PANEL - Abnormal; Notable for the following:    Potassium 3.3 (*)    Creatinine, Ser 1.13 (*)    Calcium 8.6 (*)    GFR calc non Af Amer 56 (*)    All other components within normal limits  PROTIME-INR  I-STAT TROPOININ, ED    Imaging Review Dg Chest 2 View  07/12/2015  CLINICAL DATA:  Left breast pain for 2 days. Mildly productive cough for 1 week. EXAM: CHEST  2 VIEW COMPARISON:  06/11/2015 FINDINGS: The heart size and mediastinal contours are within normal limits. Both lungs are clear. The visualized skeletal structures are unremarkable. IMPRESSION: No active cardiopulmonary disease. Electronically Signed   By: Andreas Newport M.D.   On: 07/12/2015 18:37   I have personally reviewed and evaluated these images and lab results as part of my medical decision-making.   EKG Interpretation   Date/Time:  Tuesday July 12 2015 18:09:36 EDT Ventricular Rate:  84 PR Interval:  143 QRS Duration: 114 QT Interval:  386 QTC Calculation: 456 R Axis:   29 Text Interpretation:  Sinus rhythm Borderline intraventricular conduction  delay Abnormal R-wave progression, early transition No significant change  since last tracing Confirmed by Tonea Leiphart  MD-J, Daisie Haft [id number]) on 07/12/2015  6:15:15 PM      MDM   Final diagnoses:  Chest pain with low risk for cardiac etiology    Sx atypical for heart disease.  Heart score is 2.  Low risk for ACS.  Overall doubt her sx are related to ACS.  No complaints of dyspnea.  No PE risk factors.  PERC negative.  Suspect musculoskeletal cause.  Stable to follow up with PCP.        Dorie Rank, MD 07/12/15 [phone]

## 2015-07-12 NOTE — ED Notes (Signed)
Pt reports left breast pain x2 days. Pt denies any known injury. Pt also reports intermittent cough. Pt reports called pcp and was told to come to ED. nad noted.

## 2015-07-12 NOTE — Discharge Instructions (Signed)
Chest Wall Pain °Chest wall pain is pain in or around the bones and muscles of your chest. Sometimes, an injury causes this pain. Sometimes, the cause may not be known. This pain may take several weeks or longer to get better. °HOME CARE °Pay attention to any changes in your symptoms. Take these actions to help with your pain: °· Rest as told by your doctor. °· Avoid activities that cause pain. Try not to use your chest, belly (abdominal), or side muscles to lift heavy things. °· If directed, apply ice to the painful area: °¨ Put ice in a plastic bag. °¨ Place a towel between your skin and the bag. °¨ Leave the ice on for 20 minutes, 2-3 times per day. °· Take over-the-counter and prescription medicines only as told by your doctor. °· Do not use tobacco products, including cigarettes, chewing tobacco, and e-cigarettes. If you need help quitting, ask your doctor. °· Keep all follow-up visits as told by your doctor. This is important. °GET HELP IF: °· You have a fever. °· Your chest pain gets worse. °· You have new symptoms. °GET HELP RIGHT AWAY IF: °· You feel sick to your stomach (nauseous) or you throw up (vomit). °· You feel sweaty or light-headed. °· You have a cough with phlegm (sputum) or you cough up blood. °· You are short of breath. °  °This information is not intended to replace advice given to you by your health care provider. Make sure you discuss any questions you have with your health care provider. °  °Document Released: 09/12/2007 Document Revised: 12/15/2014 Document Reviewed: 06/21/2014 °Elsevier Interactive Patient Education ©2016 [employer] ° °

## 2015-07-13 ENCOUNTER — Telehealth: Payer: Self-pay | Admitting: Cardiovascular Disease

## 2015-07-13 NOTE — Telephone Encounter (Signed)
Please make sure that she gradually comes off the medication, by reducing the dose in 1/2 every 48 hours, so that she weans off completely in about 4 days.

## 2015-07-13 NOTE — Telephone Encounter (Signed)
New message      Calling to give nurse update on ENT visit

## 2015-07-13 NOTE — Telephone Encounter (Signed)
Returned call to patient no answer.LMTC. 

## 2015-07-13 NOTE — Telephone Encounter (Signed)
Received call back from patient.She stated she is seeing a allergist and he wants to do a allergy skin test.Stated she has to hold propranolol for 2 weeks before skin test.She wanted to make sure ok with Dr.Croitoru.Message sent to Dr.Croitoru.

## 2015-07-14 NOTE — Telephone Encounter (Signed)
Returned call to patient Dr.Croitoru's recommendations given. 

## 2015-08-01 ENCOUNTER — Encounter: Payer: Self-pay | Admitting: Cardiovascular Disease

## 2015-08-01 ENCOUNTER — Ambulatory Visit (INDEPENDENT_AMBULATORY_CARE_PROVIDER_SITE_OTHER): Payer: PRIVATE HEALTH INSURANCE | Admitting: Cardiovascular Disease

## 2015-08-01 VITALS — BP 126/63 | HR 83 | Ht 67.0 in | Wt 242.8 lb

## 2015-08-01 DIAGNOSIS — I10 Essential (primary) hypertension: Secondary | ICD-10-CM

## 2015-08-01 DIAGNOSIS — R0683 Snoring: Secondary | ICD-10-CM

## 2015-08-01 DIAGNOSIS — R5382 Chronic fatigue, unspecified: Secondary | ICD-10-CM | POA: Diagnosis not present

## 2015-08-01 DIAGNOSIS — E669 Obesity, unspecified: Secondary | ICD-10-CM | POA: Diagnosis not present

## 2015-08-01 DIAGNOSIS — G4733 Obstructive sleep apnea (adult) (pediatric): Secondary | ICD-10-CM

## 2015-08-01 NOTE — Progress Notes (Signed)
[name], female   DOB: 01/27/1966, 50 y.o.   MRN: [id number]    Primary cardiologist: Dr. Sallyanne Kuster  HPI: Summer Carpenter is a 50 y.o. female who is followed by Dr. Sallyanne Kuster for cardiology care.  She is was referred to me in November 2016 for reevaluation of sleep apnea.  She presents for follow-up evaluation.  Summer Carpenter has a history of hypertension, obesity as well as palpitations.  In July 2014, she underwent a diagnostic polysomnogram to evaluate for sleep apnea and was found to have moderate obstructive sleep apnea with an AHI of 17.5 per hour but her events were severe during rams sleep at 40.6 per hour.  She had significant oxygen desaturation to a nadir of 86% and had mild snoring.  She subsequently underwent CPAP titration and was recommended to initiate usher at 12 cm water pressure.  Apparently, she had only used CPAP for a very short duration and since she was noncompliant she turned in her equipment and has been without CPAP therapy ever since.  When I saw her in November 2016 she had noticed that she was awakening gasping for breath and choking.  She was witnessed to have apneic spells.  She wakes up at least 5 times per night.  She contacted her DME company, who recommended that she needed to have another sleep evaluation and prescription for therapy.  At that time, she admitted to snoring, frequent awakenings and nonrestorative sleep.  She admitted  to hypersomnolence.  She denied restless legs.  Her Epworth Sleepiness Scale was recalculated and was positive for excessive daytime sleepiness as shown below.  Epworth Sleepiness Scale: Situation   Chance of Dozing/Sleeping (0 = never , 1 = slight chance , 2 = moderate chance , 3 = high chance )   sitting and reading 2   watching TV 2   sitting inactive in a public place 1   being a passenger in a motor vehicle for an hour or more 1   lying down in the afternoon 3   sitting and talking to someone 1   sitting quietly after lunch (no alcohol) 1   while stopped for a few minutes in traffic as the driver 1   Total Score  12   Summer Carpenter was referred for a follow-up sleep study.  This was done on 05/23/2015 at the [place] [facility] at [facility].  This study was significantly improved from her prior study and showed increased upper airway resistance.  Overall, with an AHI of 3.2 per hour; however, she still had moderate sleep apnea during ram sleep with an AHI of 22.1.  Her minimum oxygen saturation was 89%.  There was moderate snoring.  There was moderate periodic limb movement disorder with a PLMS index of 30.28.  She presents now for follow-up evaluation and discussion of treatment options.  A repeat Epworth Sleepiness Scale score was calculated today and this endorsed only at 4 RUQ against hypersomnolence.  Past Medical History  Diagnosis Date  . Hypertension   . Anxiety   . Hyperlipidemia   . GERD (gastroesophageal reflux disease)   . Prediabetes   . Palpitations   . Obesity   . Sleep apnea   . Sleep apnea     Past Surgical History  Procedure Laterality Date  . Cholecystectomy    . Doppler echocardiography  07/30/2000 [place]    EF 55-66%,lv normal  . Nm myocar perf wall motion  10/22/2011  . Cardiac  catheterization  11/25/2001    norm. LV fx,norm. abd aorta, both renal arteries patent  . Oophorectomy  2008    lt  . Dilitation & currettage/hystroscopy with novasure ablation N/A 12/15/2014    Procedure: DILATATION & CURETTAGE/HYSTEROSCOPY WITH ENDOMETRIAL NOVASURE ABLATION;  Surgeon: Florian Buff, MD;  Location: [facility];  Service: Gynecology;  Laterality: N/A;  . Cervical polypectomy N/A 12/15/2014    Procedure: ENDOMETRIAL POLYPECTOMY;  Surgeon: Florian Buff, MD;  Location: [facility];  Service: Gynecology;  Laterality: N/A;    Allergies  Allergen Reactions  . Septra [Bactrim] Hives and Diarrhea    Current Outpatient Prescriptions  Medication Sig  Dispense Refill  . acetaminophen (TYLENOL) 500 MG tablet Take 500 mg by mouth every 6 (six) hours as needed for mild pain or moderate pain.    Marland Kitchen albuterol (PROVENTIL HFA;VENTOLIN HFA) 108 (90 Base) MCG/ACT inhaler Inhale 1-2 puffs into the lungs every 6 (six) hours as needed for wheezing or shortness of breath. 1 Inhaler 0  . aspirin EC 81 MG tablet Take 81 mg by mouth daily.      Marland Kitchen azelastine (ASTELIN) 0.1 % nasal spray SPRAY 2 SPRAYS IN EACH NOSTRIL TWICE A DAY AS NEEDED FOR ALLERGIES/CONGESTION  12  . KLOR-CON M10 10 MEQ tablet TAKE ONE CAPSULE BY MOUTH EVERY DAY 90 tablet 0  . lisinopril-hydrochlorothiazide (PRINZIDE,ZESTORETIC) 10-12.5 MG tablet Take 1 tablet by mouth daily.  1  . methocarbamol (ROBAXIN) 500 MG tablet Take 2 tablets (1,000 mg total) by mouth 4 (four) times daily as needed for muscle spasms (muscle spasm/pain). 25 tablet 0  . naproxen (NAPROSYN) 500 MG tablet Take 1 tablet (500 mg total) by mouth 2 (two) times daily with a meal. As needed for pain 20 tablet 0  . pantoprazole (PROTONIX) 40 MG tablet Take 1 tablet (40 mg total) by mouth daily. 90 tablet 3  . propranolol ER (INDERAL LA) 120 MG 24 hr capsule Take 120 mg by mouth daily.    . sertraline (ZOLOFT) 50 MG tablet Take 50 mg by mouth every evening.     . simvastatin (ZOCOR) 10 MG tablet Take 10 mg by mouth at bedtime.     . sucralfate (CARAFATE) 1 G tablet Take 1 tablet by mouth 3 (three) times daily before meals.  0  . valACYclovir (VALTREX) 500 MG tablet Take 1 tablet by mouth as needed.     No current facility-administered medications for this visit.    Social History   Social History  . Marital Status: Single    Spouse Name: N/A  . Number of Children: N/A  . Years of Education: N/A   Occupational History  . Not on file.   Social History Main Topics  . Smoking status: Never Smoker   . Smokeless tobacco: Never Used  . Alcohol Use: Yes     Comment: Occasionally, once a month  . Drug Use: No  . Sexual  Activity: Yes    Birth Control/ Protection: Condom, None   Other Topics Concern  . Not on file   Social History Narrative    Family History  Problem Relation Age of Onset  . Hypertension Mother   . Heart attack Mother   . Seizures Father   . Hypertension Brother   . Hypertension Brother   . Hypertension Sister   . Early death Brother   . Hypertension Son      ROS Carpenter: Negative; No fevers, chills, or night sweats HEENT: Negative; No changes in vision  or hearing, sinus congestion, difficulty swallowing Pulmonary: Negative; No cough, wheezing, shortness of breath, hemoptysis Cardiovascular: Negative; No chest pain, presyncope, syncope, palpatations GI: Negative; No nausea, vomiting, diarrhea, or abdominal pain GU: Negative; No dysuria, hematuria, or difficulty voiding Musculoskeletal: Negative; no myalgias, joint pain, or weakness Hematologic: Negative; no easy bruising, bleeding Endocrine: Negative; no heat/cold intolerance Neuro: Negative; no changes in balance, headaches Skin: Negative; No rashes or skin lesions Psychiatric: Negative; No behavioral problems, depression Sleep: See history of present illness; no bruxism, restless legs, hypnogognic hallucinations, no cataplexy   Physical Exam BP 126/63 mmHg  Pulse 83  Ht '5\' 7"'  (1.702 m)  Wt 242 lb 12.8 oz (110.133 kg)  BMI 38.02 kg/m2  LMP 06/11/2015   Wt Readings from Last 3 Encounters:  08/01/15 242 lb 12.8 oz (110.133 kg)  07/12/15 240 lb (108.863 kg)  06/11/15 240 lb (108.863 kg)   Carpenter: Alert, oriented, no distress.  Skin: normal turgor, no rashes HEENT: Normocephalic, atraumatic. Pupils round and reactive; sclera anicteric; extraocular muscles intact; Fundi without hemorrhages or exudates Nose without nasal septal hypertrophy Mouth/Parynx benign; Mallinpatti scale 3 Neck: No JVD, no carotid briuts Lungs: clear to ausculatation and percussion; no wheezing or rales  Chest wall: No tenderness to  palpation Heart: RRR, s1 s2 normal  Abdomen: soft, nontender; no hepatosplenomehaly, BS+; abdominal aorta nontender and not dilated by palpation. Back: No CVA tenderness Pulses 2+ Extremities: no clubbinbg cyanosis or edema, Homan's sign negative  Neurologic: grossly nonfocal; cranial nerves intact. Psychological: Normal affect and mood.  November 2016 ECG (independently read by me): Normal sinus rhythm at 72 bpm with accelerated AV conduction and a short PR interval at 106 ms.  QTc interval 438 ms.  LABS:  BMP Latest Ref Rng 07/12/2015 06/11/2015 01/28/2015  Glucose 65 - 99 mg/dL 90 101(H) 101(H)  BUN 6 - 20 mg/dL '14 11 12  ' Creatinine 0.44 - 1.00 mg/dL 1.13(H) 0.98 1.14(H)  Sodium 135 - 145 mmol/L 140 138 139  Potassium 3.5 - 5.1 mmol/L 3.3(L) 3.6 3.2(L)  Chloride 101 - 111 mmol/L 104 106 100(L)  CO2 22 - 32 mmol/L '28 27 30  ' Calcium 8.9 - 10.3 mg/dL 8.6(L) 8.9 9.3     Hepatic Function Latest Ref Rng 07/12/2015 01/28/2015 01/13/2015  Total Protein 6.5 - 8.1 g/dL 7.7 7.9 8.0  Albumin 3.5 - 5.0 g/dL 4.0 4.1 4.1  AST 15 - 41 U/L '22 20 23  ' ALT 14 - 54 U/L '17 17 22  ' Alk Phosphatase 38 - 126 U/L 69 79 71  Total Bilirubin 0.3 - 1.2 mg/dL 0.8 0.8 0.6     CBC Latest Ref Rng 07/12/2015 06/11/2015 01/28/2015  WBC 4.0 - 10.5 K/uL 10.4 9.5 12.2(H)  Hemoglobin 12.0 - 15.0 g/dL 10.2(L) 10.5(L) 10.9(L)  Hematocrit 36.0 - 46.0 % 32.3(L) 33.2(L) 34.6(L)  Platelets 150 - 400 K/uL 402(H) 353 372     Lipid Panel  No results found for: CHOL, TRIG, HDL, CHOLHDL, VLDL, LDLCALC, LDLDIRECT   RADIOLOGY: Dg Chest 2 View  07/12/2015  CLINICAL DATA:  Left breast pain for 2 days. Mildly productive cough for 1 week. EXAM: CHEST  2 VIEW COMPARISON:  06/11/2015 FINDINGS: The heart size and mediastinal contours are within normal limits. Both lungs are clear. The visualized skeletal structures are unremarkable. IMPRESSION: No active cardiopulmonary disease. Electronically Signed   By: Andreas Newport M.D.   On:  07/12/2015 18:37      ASSESSMENT AND PLAN: Summer Carpenter is a 50 year old female who  has a history of obesity with a body mass index of 38.02 kg/m, as well as a history of hypertension and palpitations.  She has accelerated AV conduction on her ECG.  In July 2014, she was demonstrated to have at least moderate sleep apnea overall, but her sleep apnea was severe during REM sleep.  1.  I saw her in November 2016.  She had become more symptomatic and was been awakening frequently, gasping for breath and feeling that she is choking at night.  She typically goes to bed at 10:30 AM and wakes up at 6:30, but often is up at least 5 times throughout the night.  At that time, I wrote her a new prescription for CPAP therapy, but her insurance required a repeat evaluation.  A repeat sleep study was done and I reviewed this in detail with her.  The most recent study is improved from 2014 and did not reveal significant sleep apnea overall; however, there continued to be moderate sleep apnea with REM sleep.  Her Epworth Sleepiness Scale score has significantly improved.  She feels that see is sleeping significantly better.  She denies any major weight loss.  I discussed options with her concerning possible initiation of CPAP therapy particularly with her.  Moderate sleep apnea with REM sleep versus consideration of  Initially trying a customized oral appliance , which may provide some mandibular advancement and improve her symptomatology.  I discussed the pros and cons of each treatment option.  After much discussion, she would like to possibly consider an oral appliance.  I will refer her to Dr. Oneal Grout for further evaluation.  Time spent: 25 minutes  Troy Sine, MD, [facility]  08/01/2015 5:32 PM

## 2015-08-01 NOTE — Patient Instructions (Signed)
Your physician recommends that you schedule a follow-up appointment as needed for sleep.  You have been referred to Oneal Grout for a oral appliance.

## 2015-08-02 ENCOUNTER — Telehealth: Payer: Self-pay | Admitting: *Deleted

## 2015-08-02 NOTE — Telephone Encounter (Signed)
Faxed referral with notes to dr Kae Heller office for evaluation to get a oral appliance for OSA.

## 2015-08-15 ENCOUNTER — Encounter: Payer: Self-pay | Admitting: Obstetrics & Gynecology

## 2015-08-15 ENCOUNTER — Ambulatory Visit (INDEPENDENT_AMBULATORY_CARE_PROVIDER_SITE_OTHER): Payer: PRIVATE HEALTH INSURANCE | Admitting: Obstetrics & Gynecology

## 2015-08-15 VITALS — BP 120/68 | HR 68 | Ht 67.0 in | Wt 245.5 lb

## 2015-08-15 DIAGNOSIS — Z30011 Encounter for initial prescription of contraceptive pills: Secondary | ICD-10-CM

## 2015-08-15 MED ORDER — NORETHINDRONE 0.35 MG PO TABS: 1.0000 | ORAL_TABLET | Freq: Every day | ORAL | Status: DC

## 2015-08-15 NOTE — Progress Notes (Signed)
[name], female   DOB: 1965-11-30, 50 y.o.   MRN: [id number]      Chief Complaint  Patient presents with  . discuss tubal ligation    Blood pressure 120/68, pulse 68, height 5\' 7"  (1.702 m), weight 245 lb 8 oz (111.358 kg), last menstrual period 07/28/2015.  50 y.o. [id number] Patient's last menstrual period was 07/28/2015. The current method of family planning is none.  Subjective Patient had a hysteroscopy D&C endometrial ablation last September and her periods are much much lighter although she still has some cyclical bleeding each month it's much more all herbal and really no pain She is concerned however with contraception at the time she really didn't address the issue we talked about a tubal ligation but did not proceed with that  Objective No exam today  Pertinent ROS Per history of present illness otherwise negative  Labs or studies None    Impression Diagnoses this Encounter::   ICD-9-CM ICD-10-CM   1. Encounter for initial prescription of contraceptive pills V25.01 Z30.011     Established relevant diagnosis(es):   Plan/Recommendations: Meds ordered this encounter  Medications  . norethindrone (MICRONOR,CAMILA,ERRIN) 0.35 MG tablet    Sig: Take 1 tablet (0.35 mg total) by mouth daily. Take 1 a day    Dispense:  1 Package    Refill:  11    Pt is going to take the pills continuously no off week at all, so she will get the pills every 3 weeks    Labs or Scans Ordered: No orders of the defined types were placed in this encounter.    Management:: [place] the patient amenorrheic we'll place her on low-dose birth control pill norethindrone and see if that will stop her.  Also serve as birth control pills   if she has any problems with that she'll give me a call  Follow up Return if symptoms worsen or fail to improve.        Face to face time:  10 minutes  Greater than 50% of the visit time was spent in counseling and  coordination of care with the patient.  The summary and outline of the counseling and care coordination is summarized in the note above.   All questions were answered.

## 2015-09-07 ENCOUNTER — Other Ambulatory Visit: Payer: Self-pay | Admitting: Cardiovascular Disease

## 2015-09-07 NOTE — Telephone Encounter (Signed)
Rx(s) sent to pharmacy electronically.  

## 2015-09-14 ENCOUNTER — Telehealth: Payer: Self-pay | Admitting: Obstetrics & Gynecology

## 2015-09-14 ENCOUNTER — Other Ambulatory Visit: Payer: Self-pay | Admitting: Obstetrics & Gynecology

## 2015-09-14 MED ORDER — MEGESTROL ACETATE 40 MG PO TABS: ORAL_TABLET | ORAL | Status: DC

## 2015-09-14 NOTE — Telephone Encounter (Signed)
Pt called stating that she would like a call back from a Nurse, Pt states that she has a question to ask. Pt did not release the information to me. Please contact pt

## 2015-09-14 NOTE — Telephone Encounter (Signed)
Pt states has been taking the OCP to stop her periods x 30 days, continues to have vaginal bleeding. Please advise.

## 2015-09-19 ENCOUNTER — Telehealth: Payer: Self-pay | Admitting: *Deleted

## 2015-09-19 ENCOUNTER — Other Ambulatory Visit: Payer: Self-pay | Admitting: Obstetrics & Gynecology

## 2015-09-19 NOTE — Telephone Encounter (Signed)
I e prescribed megace, did she get it and start taking, if so give it some time and it should stop

## 2015-09-19 NOTE — Telephone Encounter (Signed)
Pt states started heavy period x 2 weeks ago, bleeding has just started to lighten. Pt states she has been taken the Micronor as Dr.Eure prescribed and thought it would have stopped her vaginal bleeding by now. Please advise.

## 2015-09-20 NOTE — Telephone Encounter (Signed)
Pt informed per Dr. Elonda Husky needs to take the Micronor and the Megace as prescribed to stop vaginal bleeding, may take some time. Pt verbalized understanding.

## 2015-10-11 ENCOUNTER — Emergency Department (HOSPITAL_COMMUNITY)
Admission: EM | Admit: 2015-10-11 | Discharge: 2015-10-11 | Disposition: A | Discharge status: Home / Self Care | Payer: PRIVATE HEALTH INSURANCE | Attending: Emergency Medicine | Admitting: Emergency Medicine

## 2015-10-11 ENCOUNTER — Encounter (HOSPITAL_COMMUNITY): Payer: Self-pay | Admitting: Emergency Medicine

## 2015-10-11 ENCOUNTER — Emergency Department (HOSPITAL_COMMUNITY): Payer: PRIVATE HEALTH INSURANCE

## 2015-10-11 DIAGNOSIS — Z7982 Long term (current) use of aspirin: Secondary | ICD-10-CM | POA: Diagnosis not present

## 2015-10-11 DIAGNOSIS — M79604 Pain in right leg: Secondary | ICD-10-CM

## 2015-10-11 DIAGNOSIS — Z79899 Other long term (current) drug therapy: Secondary | ICD-10-CM | POA: Diagnosis not present

## 2015-10-11 DIAGNOSIS — E785 Hyperlipidemia, unspecified: Secondary | ICD-10-CM | POA: Diagnosis not present

## 2015-10-11 DIAGNOSIS — M79661 Pain in right lower leg: Secondary | ICD-10-CM | POA: Diagnosis present

## 2015-10-11 DIAGNOSIS — I10 Essential (primary) hypertension: Secondary | ICD-10-CM | POA: Diagnosis not present

## 2015-10-11 MED ORDER — NAPROXEN 500 MG PO TABS: 500.0000 mg | ORAL_TABLET | Freq: Two times a day (BID) | ORAL | Status: DC

## 2015-10-11 NOTE — Discharge Instructions (Signed)
Workup without any evidence of a blood clot in the right leg no evidence of any bony injury. Will treat with anti-inflammatories. Follow up with your doctor if not improving in a few days. Work note provided.

## 2015-10-11 NOTE — ED Provider Notes (Signed)
CSN: [id number]     Arrival date & time 10/11/15  1155 History  By signing my name below, I, Emmanuella Mensah, attest that this documentation has been prepared under the direction and in the presence of Fredia Sorrow, MD. Electronically Signed: Judithann Sauger, ED Scribe. 10/11/2015. 1:02 PM.    Chief Complaint  Patient presents with  . Leg Pain   Patient is a 50 y.o. female presenting with leg pain. The history is provided by the patient. No language interpreter was used.  Leg Pain Location:  Leg Time since incident:  10 hours Injury: no   Leg location:  R lower leg Pain details:    Radiates to:  Does not radiate   Severity:  Mild   Onset quality:  Gradual   Duration:  10 hours   Timing:  Constant   Progression:  Unchanged Chronicity:  New Dislocation: no   Prior injury to area:  No Relieved by:  None tried Worsened by:  Nothing tried Ineffective treatments:  None tried Associated symptoms: no back pain, no fever and no swelling    HPI Comments: Summer Carpenter is a 50 y.o. female with a hx of hypertension and HLD who presents to the Emergency Department for evaluation for a possible DVT to right calf. Pt explains that she has been having ongoing 2/10 pain to the medial aspect of right leg onset 2 am today. She states that the pain is worse with movement. No recent falls, injuries, or trauma. She denies any long car/plane trips or immobilizations. No alleviating factors noted. Pt has not tried any medications PTA. She reports that she has been having intermittent ankle swelling for a while. She denies any fever, chest pain, or SOB   Past Medical History  Diagnosis Date  . Hypertension   . Anxiety   . Hyperlipidemia   . GERD (gastroesophageal reflux disease)   . Prediabetes   . Palpitations   . Obesity   . Sleep apnea   . Sleep apnea    Past Surgical History  Procedure Laterality Date  . Cholecystectomy    . Doppler echocardiography  07/30/2000 [place]     EF 55-66%,lv normal  . Nm myocar perf wall motion  10/22/2011  . Cardiac catheterization  11/25/2001    norm. LV fx,norm. abd aorta, both renal arteries patent  . Oophorectomy  2008    lt  . Dilitation & currettage/hystroscopy with novasure ablation N/A 12/15/2014    Procedure: DILATATION & CURETTAGE/HYSTEROSCOPY WITH ENDOMETRIAL NOVASURE ABLATION;  Surgeon: Florian Buff, MD;  Location: [facility];  Service: Gynecology;  Laterality: N/A;  . Cervical polypectomy N/A 12/15/2014    Procedure: ENDOMETRIAL POLYPECTOMY;  Surgeon: Florian Buff, MD;  Location: [facility];  Service: Gynecology;  Laterality: N/A;   Family History  Problem Relation Age of Onset  . Hypertension Mother   . Heart attack Mother   . Seizures Father   . Hypertension Brother   . Hypertension Brother   . Hypertension Sister   . Early death Brother   . Hypertension Son    Social History  Substance Use Topics  . Smoking status: Never Smoker   . Smokeless tobacco: Never Used  . Alcohol Use: Yes     Comment: Occasionally, once a month   OB History    Gravida Para Term Preterm AB TAB SAB Ectopic Multiple Living   6 3 3  3  3         Review of Systems  Constitutional: Negative  for fever and chills.  HENT: Negative for rhinorrhea and sore throat.   Eyes: Negative for visual disturbance.  Respiratory: Negative for cough and shortness of breath.   Cardiovascular: Positive for leg swelling. Negative for chest pain.  Gastrointestinal: Negative for nausea, vomiting, abdominal pain and diarrhea.  Genitourinary: Negative for dysuria and hematuria.  Musculoskeletal: Positive for arthralgias. Negative for back pain.  Skin: Negative for rash.  Neurological: Negative for headaches.  Hematological: Does not bruise/bleed easily.  Psychiatric/Behavioral: Negative for confusion.      Allergies  Septra  Home Medications   Prior to Admission medications   Medication Sig Start Date End Date Taking? Authorizing Provider   acetaminophen (TYLENOL) 500 MG tablet Take 500 mg by mouth every 6 (six) hours as needed for mild pain or moderate pain.   Yes Historical Provider, MD  albuterol (PROVENTIL HFA;VENTOLIN HFA) 108 (90 Base) MCG/ACT inhaler Inhale 1-2 puffs into the lungs every 6 (six) hours as needed for wheezing or shortness of breath. 05/15/15  Yes Fransico Meadow, PA-C  aspirin EC 81 MG tablet Take 81 mg by mouth daily.     Yes Historical Provider, MD  azelastine (ASTELIN) 0.1 % nasal spray SPRAY 2 SPRAYS IN EACH NOSTRIL TWICE A DAY AS NEEDED FOR ALLERGIES/CONGESTION 04/14/15  Yes Historical Provider, MD  KLOR-CON M10 10 MEQ tablet TAKE ONE CAPSULE BY MOUTH EVERY DAY 09/07/15  Yes Mihai Croitoru, MD  losartan-hydrochlorothiazide (HYZAAR) 50-12.5 MG tablet Take 1 tablet by mouth daily. 10/02/15  Yes Historical Provider, MD  pantoprazole (PROTONIX) 40 MG tablet Take 1 tablet (40 mg total) by mouth daily. 03/28/15  Yes Florian Buff, MD  propranolol ER (INDERAL LA) 120 MG 24 hr capsule Take 120 mg by mouth 2 (two) times daily.    Yes Historical Provider, MD  sertraline (ZOLOFT) 50 MG tablet Take 50 mg by mouth every evening.    Yes Historical Provider, MD  simvastatin (ZOCOR) 10 MG tablet Take 10 mg by mouth at bedtime.    Yes Historical Provider, MD  sucralfate (CARAFATE) 1 G tablet Take 1 tablet by mouth 3 (three) times daily before meals. 01/08/15  Yes Historical Provider, MD  valACYclovir (VALTREX) 500 MG tablet Take 1 tablet by mouth as needed. 07/09/15  Yes Historical Provider, MD  methocarbamol (ROBAXIN) 500 MG tablet Take 2 tablets (1,000 mg total) by mouth 4 (four) times daily as needed for muscle spasms (muscle spasm/pain). Patient not taking: Reported on 10/11/2015 06/12/15   Francine Graven, DO  naproxen (NAPROSYN) 500 MG tablet Take 1 tablet (500 mg total) by mouth 2 (two) times daily with a meal. As needed for pain Patient not taking: Reported on 10/11/2015 07/12/15   Dorie Rank, MD  naproxen (NAPROSYN) 500 MG tablet  Take 1 tablet (500 mg total) by mouth 2 (two) times daily. 10/11/15   Fredia Sorrow, MD  norethindrone (MICRONOR,CAMILA,ERRIN) 0.35 MG tablet Take 1 tablet (0.35 mg total) by mouth daily. Take 1 a day Patient not taking: Reported on 10/11/2015 08/15/15   Florian Buff, MD   BP 118/71 mmHg  Pulse 63  Temp(Src) 98.7 F (37.1 C) (Oral)  Resp 18  Ht 5\' 7"  (1.702 m)  Wt 110.224 kg  BMI 38.05 kg/m2  SpO2 100%  LMP 09/21/2015 Physical Exam  Constitutional: She is oriented to person, place, and time. She appears well-developed and well-nourished.  HENT:  Head: Normocephalic and atraumatic.  Mouth/Throat: Oropharynx is clear and moist.  Eyes: Pupils are equal, round, and reactive  to light.  Sclera clear Eyes track normal  Cardiovascular: Normal rate and regular rhythm.   Pulmonary/Chest: Effort normal and breath sounds normal. No respiratory distress. She has no wheezes. She has no rales.  Lungs clear bilaterally  Abdominal: Bowel sounds are normal. There is no tenderness.  Musculoskeletal:  Left leg: no pitting edema  Right leg: no pitting edema 2+ DP pulse right leg No posterior calf tenderness, no effusion to right knee, knee cap is in right place  Neurological: She is alert and oriented to person, place, and time.  Skin: Skin is warm and dry.  Psychiatric: She has a normal mood and affect.  Nursing note and vitals reviewed.   ED Course  Procedures (including critical care time) DIAGNOSTIC STUDIES: Oxygen Saturation is 100% on RA, normal by my interpretation.    COORDINATION OF CARE: 12:57 PM- Pt advised of plan for treatment and pt agrees. Pt will receive [place] for further evaluation.    Labs Review Labs Reviewed - No data to display  Imaging Review Dg Tibia/fibula Right  10/11/2015  CLINICAL DATA:  Lower leg pain today EXAM: RIGHT TIBIA AND FIBULA - 2 VIEW COMPARISON:  None. FINDINGS: No acute fracture. No dislocation.  Unremarkable soft tissues. IMPRESSION: No acute bony  pathology. Electronically Signed   By: Marybelle Killings M.D.   On: 10/11/2015 13:38   US Venous Img Lower Unilateral Right  10/11/2015  CLINICAL DATA:  Pain in left calf for 1 day. EXAM: Left LOWER EXTREMITY VENOUS DOPPLER ULTRASOUND TECHNIQUE: Gray-scale sonography with graded compression, as well as color Doppler and duplex ultrasound were performed to evaluate the lower extremity deep venous systems from the level of the common femoral vein and including the common femoral, femoral, profunda femoral, popliteal and calf veins including the posterior tibial, peroneal and gastrocnemius veins when visible. The superficial great saphenous vein was also interrogated. Spectral Doppler was utilized to evaluate flow at rest and with distal augmentation maneuvers in the common femoral, femoral and popliteal veins. COMPARISON:  None. FINDINGS: Contralateral Common Femoral Vein: Respiratory phasicity is normal and symmetric with the symptomatic side. No evidence of thrombus. Normal compressibility. Common Femoral Vein: No evidence of thrombus. Normal compressibility, respiratory phasicity and response to augmentation. Saphenofemoral Junction: No evidence of thrombus. Normal compressibility and flow on color Doppler imaging. Profunda Femoral Vein: No evidence of thrombus. Normal compressibility and flow on color Doppler imaging. Femoral Vein: No evidence of thrombus. Normal compressibility, respiratory phasicity and response to augmentation. Popliteal Vein: No evidence of thrombus. Normal compressibility, respiratory phasicity and response to augmentation. Calf Veins: No evidence of thrombus. Normal compressibility and flow on color Doppler imaging. Superficial Great Saphenous Vein: No evidence of thrombus. Normal compressibility and flow on color Doppler imaging. Venous Reflux:  None. Other Findings:  None. IMPRESSION: No evidence of deep venous thrombosis. Electronically Signed   By: Kerby Moors M.D.   On: 10/11/2015 13:58      Fredia Sorrow, MD has personally reviewed and evaluated these images and lab results as part of his medical decision-making.   MDM   Final diagnoses:  Pain of right lower extremity    Workup for the medial right leg pain without evidence of deep vein thrombosis. No bony injuries. Will treat symptomatically with anti-inflammatories and follow-up with her doctor.  I personally performed the services described in this documentation, which was scribed in my presence. The recorded information has been reviewed and is accurate.     Fredia Sorrow, MD 10/11/15 [phone]

## 2015-10-11 NOTE — ED Notes (Signed)
Pt states she was sent from [facility] for r/o dvt workup on right calf.  States she has been having pain since last night with no injury.

## 2015-10-17 ENCOUNTER — Other Ambulatory Visit: Payer: Self-pay | Admitting: *Deleted

## 2015-10-17 ENCOUNTER — Other Ambulatory Visit: Payer: Self-pay | Admitting: Obstetrics and Gynecology

## 2015-10-17 DIAGNOSIS — Z1231 Encounter for screening mammogram for malignant neoplasm of breast: Secondary | ICD-10-CM

## 2015-10-18 MED ORDER — NORETHINDRONE 0.35 MG PO TABS: 1.0000 | ORAL_TABLET | Freq: Every day | ORAL | Status: DC

## 2015-10-28 ENCOUNTER — Other Ambulatory Visit: Payer: Self-pay | Admitting: Cardiovascular Disease

## 2015-10-28 NOTE — Telephone Encounter (Signed)
REFILL 

## 2015-11-14 ENCOUNTER — Ambulatory Visit (HOSPITAL_COMMUNITY)
Admission: RE | Admit: 2015-11-14 | Discharge: 2015-11-14 | Disposition: A | Discharge status: Home / Self Care | Payer: PRIVATE HEALTH INSURANCE | Source: Ambulatory Visit | Attending: Obstetrics and Gynecology | Admitting: Obstetrics and Gynecology

## 2015-11-14 ENCOUNTER — Encounter: Payer: Self-pay | Admitting: Obstetrics and Gynecology

## 2015-11-14 ENCOUNTER — Ambulatory Visit (INDEPENDENT_AMBULATORY_CARE_PROVIDER_SITE_OTHER): Payer: PRIVATE HEALTH INSURANCE | Admitting: Obstetrics and Gynecology

## 2015-11-14 DIAGNOSIS — Z1231 Encounter for screening mammogram for malignant neoplasm of breast: Secondary | ICD-10-CM | POA: Diagnosis present

## 2015-11-14 DIAGNOSIS — Z01419 Encounter for gynecological examination (general) (routine) without abnormal findings: Secondary | ICD-10-CM | POA: Diagnosis not present

## 2015-11-14 NOTE — Progress Notes (Signed)
[name], female   DOB: [birth year], 50 y.o.   MRN: [id number]  Assessment:  Annual Gyn Exam   Plan:  1. pap smear not done (Last pap normal on 10/05/14) next pap due in 2 years 2. return annually or prn 3    Annual mammogram and routine self exams advised  Subjective:  Summer Carpenter is a 50 y.o. female [phone] who presents for annual exam. Patient's last menstrual period was 11/14/2015.   She reports her periods have continued to be heavy despite endometrial ablation last year. She reports that the duration is shorter, but the bleeding is still heavy. She is not currently taking birth control. She denies abnormal vaginal discharge or difficulty passing bowel movements.   The following portions of the patient's history were reviewed and updated as appropriate: allergies, current medications, past family history, past medical history, past social history, past surgical history and problem list.  Past Medical History:  Diagnosis Date  . Anxiety   . GERD (gastroesophageal reflux disease)   . Hyperlipidemia   . Hypertension   . Obesity   . Palpitations   . Prediabetes   . Sleep apnea   . Sleep apnea     Past Surgical History:  Procedure Laterality Date  . CARDIAC CATHETERIZATION  11/25/2001   norm. LV fx,norm. abd aorta, both renal arteries patent  . CERVICAL POLYPECTOMY N/A 12/15/2014   Procedure: ENDOMETRIAL POLYPECTOMY;  Surgeon: Florian Buff, MD;  Location: [facility];  Service: Gynecology;  Laterality: N/A;  . CHOLECYSTECTOMY    . DILITATION & CURRETTAGE/HYSTROSCOPY WITH NOVASURE ABLATION N/A 12/15/2014   Procedure: DILATATION & CURETTAGE/HYSTEROSCOPY WITH ENDOMETRIAL NOVASURE ABLATION;  Surgeon: Florian Buff, MD;  Location: [facility];  Service: Gynecology;  Laterality: N/A;  . DOPPLER ECHOCARDIOGRAPHY  07/30/2000 [place]   EF 55-66%,lv normal  . NM MYOCAR PERF WALL MOTION  10/22/2011  . OOPHORECTOMY  2008   lt     Current Outpatient  Prescriptions:  .  acetaminophen (TYLENOL) 500 MG tablet, Take 500 mg by mouth every 6 (six) hours as needed for mild pain or moderate pain., Disp: , Rfl:  .  albuterol (PROVENTIL HFA;VENTOLIN HFA) 108 (90 Base) MCG/ACT inhaler, Inhale 1-2 puffs into the lungs every 6 (six) hours as needed for wheezing or shortness of breath., Disp: 1 Inhaler, Rfl: 0 .  aspirin EC 81 MG tablet, Take 81 mg by mouth daily.  , Disp: , Rfl:  .  azelastine (ASTELIN) 0.1 % nasal spray, SPRAY 2 SPRAYS IN EACH NOSTRIL TWICE A DAY AS NEEDED FOR ALLERGIES/CONGESTION, Disp: , Rfl: 12 .  KLOR-CON M10 10 MEQ tablet, TAKE ONE CAPSULE BY MOUTH EVERY DAY, Disp: 90 tablet, Rfl: 1 .  losartan-hydrochlorothiazide (HYZAAR) 50-12.5 MG tablet, Take 1 tablet by mouth daily., Disp: , Rfl: 6 .  pantoprazole (PROTONIX) 40 MG tablet, Take 1 tablet (40 mg total) by mouth daily., Disp: 90 tablet, Rfl: 3 .  propranolol ER (INDERAL LA) 120 MG 24 hr capsule, TAKE ONE CAPSULE BY MOUTH TWICE A DAY, Disp: 180 capsule, Rfl: 1 .  sertraline (ZOLOFT) 50 MG tablet, Take 50 mg by mouth every evening. , Disp: , Rfl:  .  simvastatin (ZOCOR) 10 MG tablet, Take 10 mg by mouth at bedtime. , Disp: , Rfl:  .  sucralfate (CARAFATE) 1 G tablet, Take 1 tablet by mouth 3 (three) times daily before meals., Disp: , Rfl: 0 .  valACYclovir (VALTREX) 500 MG tablet, Take 1 tablet by mouth  as needed., Disp: , Rfl:   Review of Systems Constitutional: negative Gastrointestinal: negative Genitourinary: positive for menorrhagia   Objective:  BP 128/72 (BP Location: Left Arm, Patient Position: Sitting, Cuff Size: Large)   Ht 5\' 7"  (1.702 m)   Wt 242 lb (109.8 kg)   LMP 11/14/2015   BMI 37.90 kg/m    BMI: Body mass index is 37.9 kg/m.  General Appearance: Alert, appropriate appearance for age. No acute distress HEENT: Grossly normal Neck / Thyroid:  Cardiovascular: RRR; normal S1, S2, no murmur Lungs: CTA bilaterally Back: No CVAT Breast Exam: No masses or  nodes. No dimpling, nipple retraction or discharge. Tissues even.  Gastrointestinal: Soft, non-tender, no masses or organomegaly Pelvic Exam:  External genitalia: normal general appearance Vaginal: normal mucosa without prolapse or lesions Cervix: normal appearance Adnexa: normal bimanual exam Uterus: normal single, nontender Rectovaginal: normal rectal, no masses and guaiac negative stool obtained Lymphatic Exam: Non-palpable nodes in neck, clavicular, axillary, or inguinal regions  Skin: no rash or abnormalities Neurologic: Normal gait and speech, no tremor  Psychiatric: Alert and oriented, appropriate affect.  Urinalysis:Not done  Guaiac negative   Mallory Shirk. MD Pgr [phone] 11:07 AM    By signing my name below, I, Hansel Feinstein, attest that this documentation has been prepared under the direction and in the presence of Jonnie Kind, MD. Electronically Signed: Hansel Feinstein, ED Scribe. 11/14/15. 11:24 AM.  I personally performed the services described in this documentation, which was SCRIBED in my presence. The recorded information has been reviewed and considered accurate. It has been edited as necessary during review. Jonnie Kind, MD

## 2015-12-15 ENCOUNTER — Telehealth: Payer: Self-pay | Admitting: Cardiovascular Disease

## 2015-12-15 MED ORDER — PROPRANOLOL HCL ER 160 MG PO CP24: 160.0000 mg | ORAL_CAPSULE | Freq: Two times a day (BID) | ORAL | 1 refills | Status: DC

## 2015-12-15 NOTE — Telephone Encounter (Signed)
Spoke to [place] at [facility] who confirmed correct med filled last time, manufacturer change noted at recent restock. Informed me med will be filled today for dose change.

## 2015-12-15 NOTE — Telephone Encounter (Signed)
Interesting, almost sounds like the same amount of medicine isn't doing the same job. It may be a new generic drug manufacturer? We can increase to 160 mg twice daily.

## 2015-12-15 NOTE — Telephone Encounter (Signed)
Pt been having more palpitations,she thinks she needs to be seen asap please.

## 2015-12-15 NOTE — Telephone Encounter (Signed)
Left msg for patient to call. 

## 2015-12-15 NOTE — Telephone Encounter (Signed)
Pt of Dr. Sallyanne Kuster  Patient notes she is on propranolol ER for palpitations. She takes 120mg  BID. states in the past couple weeks she's had more frequent palpitations and is wondering if she needs to come in, or if dosage considerations for this medication warranted.  She states BP is typically [id number] systolic when checked. She also notes her HR recently has been higher than in the past. Typically was 70-80, her past few readings have been in the low 90s.  Patient is not experiencing any chest symptoms, shortness of breath, fatigue, or lightheadedness.  Discussed w her and will review w/ provider for recommendations.

## 2015-12-15 NOTE — Telephone Encounter (Signed)
Returned call. Advised patient on recommendations. Regarding this possible manufacturer change, I asked patient if she noted a different appearance to pills - she voiced that yes, this time when she had most recently filled the medication looked different than she remembered. Advised this would possibly affect potency, move to higher dose as recommended and notify us if she identifies concerns after she is established on this new dose. Pt voiced thanks and understanding.

## 2015-12-17 ENCOUNTER — Other Ambulatory Visit: Payer: Self-pay | Admitting: Cardiovascular Disease

## 2015-12-19 NOTE — Telephone Encounter (Signed)
Rx(s) sent to pharmacy electronically.  

## 2016-01-05 ENCOUNTER — Ambulatory Visit (INDEPENDENT_AMBULATORY_CARE_PROVIDER_SITE_OTHER): Payer: PRIVATE HEALTH INSURANCE | Admitting: Advanced Practice Midwife

## 2016-01-05 ENCOUNTER — Encounter: Payer: Self-pay | Admitting: Advanced Practice Midwife

## 2016-01-05 VITALS — BP 120/80 | HR 84 | Ht 67.0 in | Wt 241.3 lb

## 2016-01-05 DIAGNOSIS — R198 Other specified symptoms and signs involving the digestive system and abdomen: Secondary | ICD-10-CM

## 2016-01-05 DIAGNOSIS — N946 Dysmenorrhea, unspecified: Secondary | ICD-10-CM | POA: Diagnosis not present

## 2016-01-05 DIAGNOSIS — R309 Painful micturition, unspecified: Secondary | ICD-10-CM | POA: Diagnosis not present

## 2016-01-05 LAB — POCT URINALYSIS DIPSTICK
Blood, UA: NEGATIVE
GLUCOSE UA: NEGATIVE
KETONES UA: NEGATIVE
LEUKOCYTES UA: NEGATIVE
NITRITE UA: NEGATIVE

## 2016-01-05 NOTE — Progress Notes (Signed)
[facility] Visit  Patient name: Summer Carpenter MRN [id number]  Date of birth: [birth year]  CC & HPI:  Summer Carpenter is a 50 y.o. African American female presenting today for C/O rectal pressure when coughing.  She coughed 2 days ago, and lnoticed she had rectal pressure/pain during the cough.  She coughed again and had the same sensation.  She had some lower abdominal pain during one voiding episode yesterday, none since.  Started period 3 days ago, cramps have been "terrible".  She had an endo ablation last year for dysmnorrhea (after failing several types of birth control).  While flow is much lighter, her cramps are the same and "are getting to be excruciating.".  Interested in [facility].   Pertinent History Reviewed:  Medical & Surgical Hx:   Past Medical History:  Diagnosis Date  . Anxiety   . GERD (gastroesophageal reflux disease)   . Hyperlipidemia   . Hypertension   . Obesity   . Palpitations   . Prediabetes   . Sleep apnea   . Sleep apnea    Past Surgical History:  Procedure Laterality Date  . CARDIAC CATHETERIZATION  11/25/2001   norm. LV fx,norm. abd aorta, both renal arteries patent  . CERVICAL POLYPECTOMY N/A 12/15/2014   Procedure: ENDOMETRIAL POLYPECTOMY;  Surgeon: Florian Buff, MD;  Location: [facility];  Service: Gynecology;  Laterality: N/A;  . CHOLECYSTECTOMY    . DILITATION & CURRETTAGE/HYSTROSCOPY WITH NOVASURE ABLATION N/A 12/15/2014   Procedure: DILATATION & CURETTAGE/HYSTEROSCOPY WITH ENDOMETRIAL NOVASURE ABLATION;  Surgeon: Florian Buff, MD;  Location: [facility];  Service: Gynecology;  Laterality: N/A;  . DOPPLER ECHOCARDIOGRAPHY  07/30/2000 [place]   EF 55-66%,lv normal  . NM MYOCAR PERF WALL MOTION  10/22/2011  . OOPHORECTOMY  2008   lt   Family History  Problem Relation Age of Onset  . Hypertension Mother   . Heart attack Mother   . Seizures Father   . Hypertension Brother   . Hypertension Brother   . Hypertension Sister   . Early  death Brother   . Hypertension Son     Current Outpatient Prescriptions:  .  acetaminophen (TYLENOL) 500 MG tablet, Take 500 mg by mouth every 6 (six) hours as needed for mild pain or moderate pain., Disp: , Rfl:  .  aspirin EC 81 MG tablet, Take 81 mg by mouth daily.  , Disp: , Rfl:  .  azelastine (ASTELIN) 0.1 % nasal spray, SPRAY 2 SPRAYS IN EACH NOSTRIL TWICE A DAY AS NEEDED FOR ALLERGIES/CONGESTION, Disp: , Rfl: 12 .  KLOR-CON M10 10 MEQ tablet, TAKE ONE CAPSULE BY MOUTH EVERY DAY, Disp: 90 tablet, Rfl: 2 .  losartan-hydrochlorothiazide (HYZAAR) 50-12.5 MG tablet, Take 1 tablet by mouth daily., Disp: , Rfl: 6 .  pantoprazole (PROTONIX) 40 MG tablet, Take 1 tablet (40 mg total) by mouth daily., Disp: 90 tablet, Rfl: 3 .  propranolol ER (INDERAL LA) 160 MG SR capsule, Take 1 capsule (160 mg total) by mouth 2 (two) times daily., Disp: 180 capsule, Rfl: 1 .  sertraline (ZOLOFT) 50 MG tablet, Take 50 mg by mouth every evening. , Disp: , Rfl:  .  simvastatin (ZOCOR) 10 MG tablet, Take 10 mg by mouth at bedtime. , Disp: , Rfl:  .  sucralfate (CARAFATE) 1 G tablet, Take 1 tablet by mouth 3 (three) times daily before meals., Disp: , Rfl: 0 .  albuterol (PROVENTIL HFA;VENTOLIN HFA) 108 (90 Base) MCG/ACT inhaler, Inhale 1-2 puffs into  the lungs every 6 (six) hours as needed for wheezing or shortness of breath. (Patient not taking: Reported on 01/05/2016), Disp: 1 Inhaler, Rfl: 0 .  valACYclovir (VALTREX) 500 MG tablet, Take 1 tablet by mouth as needed., Disp: , Rfl:  Social History: Reviewed -  reports that she has never smoked. She has never used smokeless tobacco.  Review of Systems:   Constitutional: Negative for fever and chills Eyes: Negative for visual disturbances Respiratory: Negative for shortness of breath, dyspnea Cardiovascular: Negative for chest pain or palpitations  Gastrointestinal: Negative for vomiting, diarrhea and constipation; Genitourinary: Negative for dysuria and urgency,  vaginal irritation or itching Musculoskeletal: Negative for joint pain, myalgias  Neurological: Negative for dizziness and headaches    Objective Findings:    Physical Examination: General appearance - well appearing, and in no distress Mental status - alert, oriented to person, place, and time Chest:  Normal respiratory effort Heart - normal rate and regular rhythm Abdomen:  Soft, nontender Pelvic: Vulva normal. SSE:  Scant brown blood, no visible discharge. Cx non friable.  Neg CMT.  Uterus nontender to bimanual. Position is sl retroverted, not flexed. Cannot elicit same pressure sensation except with pressure on posterior vaginal wall.  Musculoskeletal:  Normal range of motion without pain Extremities:  No edema    Results for orders placed or performed in visit on 01/05/16 (from the past 24 hour(s))  POCT urinalysis dipstick   Collection Time: 01/05/16  9:11 AM  Result Value Ref Range   Color, UA     Clarity, UA     Glucose, UA neg    Bilirubin, UA     Ketones, UA neg    Spec Grav, UA     Blood, UA neg    pH, UA     Protein, UA trace    Urobilinogen, UA     Nitrite, UA neg    Leukocytes, UA Negative Negative      Assessment & Plan:  A:   Rectal pressure w/valsalva  "excruciating" dysmenorrhea; failed conservative management  No concern for infection P:     Return for come back to see J[facility]--discuss poss hysterectomy.  CRESENZO-DISHMAN,Susana Duell CNM 01/05/2016 10:08 AM

## 2016-01-23 ENCOUNTER — Ambulatory Visit: Payer: PRIVATE HEALTH INSURANCE | Admitting: Obstetrics and Gynecology

## 2016-02-23 ENCOUNTER — Encounter (HOSPITAL_COMMUNITY): Payer: Self-pay | Admitting: Emergency Medicine

## 2016-02-23 ENCOUNTER — Emergency Department (HOSPITAL_COMMUNITY)
Admission: EM | Admit: 2016-02-23 | Discharge: 2016-02-23 | Disposition: A | Discharge status: Home / Self Care | Payer: PRIVATE HEALTH INSURANCE | Attending: Emergency Medicine | Admitting: Emergency Medicine

## 2016-02-23 DIAGNOSIS — Z791 Long term (current) use of non-steroidal anti-inflammatories (NSAID): Secondary | ICD-10-CM | POA: Diagnosis not present

## 2016-02-23 DIAGNOSIS — Z79899 Other long term (current) drug therapy: Secondary | ICD-10-CM | POA: Insufficient documentation

## 2016-02-23 DIAGNOSIS — R42 Dizziness and giddiness: Secondary | ICD-10-CM | POA: Diagnosis present

## 2016-02-23 DIAGNOSIS — Z7982 Long term (current) use of aspirin: Secondary | ICD-10-CM | POA: Diagnosis not present

## 2016-02-23 DIAGNOSIS — I10 Essential (primary) hypertension: Secondary | ICD-10-CM | POA: Diagnosis not present

## 2016-02-23 DIAGNOSIS — R002 Palpitations: Secondary | ICD-10-CM | POA: Diagnosis not present

## 2016-02-23 LAB — I-STAT TROPONIN, ED: TROPONIN I, POC: 0 ng/mL (ref 0.00–0.08)

## 2016-02-23 LAB — I-STAT CHEM 8, ED
BUN: 17 mg/dL (ref 6–20)
CALCIUM ION: 1.24 mmol/L (ref 1.15–1.40)
Chloride: 105 mmol/L (ref 101–111)
Creatinine, Ser: 1.1 mg/dL — ABNORMAL HIGH (ref 0.44–1.00)
Glucose, Bld: 143 mg/dL — ABNORMAL HIGH (ref 65–99)
HCT: 34 % — ABNORMAL LOW (ref 36.0–46.0)
HEMOGLOBIN: 11.6 g/dL — AB (ref 12.0–15.0)
Potassium: 3.6 mmol/L (ref 3.5–5.1)
SODIUM: 141 mmol/L (ref 135–145)
TCO2: 25 mmol/L (ref 0–100)

## 2016-02-23 NOTE — ED Provider Notes (Signed)
[facility] DEPT Provider Note   CSN: [id number] Arrival date & time: 02/23/16  1657     History   Chief Complaint Chief Complaint  Patient presents with  . Palpitations    HPI Summer Carpenter is a 50 y.o. female.  HPI Patient presents with palpitations. States she was shopping and began to feel her heart racing fast and hard. States she felt a little lightheaded with it. No chest pain. No trouble breathing. She's been doing well the last few days but has had foot pain. States she's got started on prednisone by her primary care doctor yesterday. States she's had 3 pills yesterday to today. She's had previous palpitations. No previous coronary artery disease. She's had palpitations particularly when her potassium has been low. Past Medical History:  Diagnosis Date  . Anxiety   . GERD (gastroesophageal reflux disease)   . Hyperlipidemia   . Hypertension   . Obesity   . Palpitations   . Prediabetes   . Sleep apnea   . Sleep apnea     Patient Active Problem List   Diagnosis Date Noted  . Well woman exam with routine gynecological exam 11/14/2015  . Encounter for other contraceptive management 02/04/2014  . Hypokalemia 08/28/2013  . Anemia 08/28/2013  . OSA (obstructive sleep apnea) 12/22/2012  . Fatigue 10/08/2012  . Snoring 10/08/2012  . Palpitations 09/14/2012  . Essential hypertension 09/14/2012  . Hyperlipidemia 09/14/2012  . Obesity (BMI 35.0-39.9 without comorbidity) 09/14/2012    Past Surgical History:  Procedure Laterality Date  . CARDIAC CATHETERIZATION  11/25/2001   norm. LV fx,norm. abd aorta, both renal arteries patent  . CERVICAL POLYPECTOMY N/A 12/15/2014   Procedure: ENDOMETRIAL POLYPECTOMY;  Surgeon: Florian Buff, MD;  Location: [facility];  Service: Gynecology;  Laterality: N/A;  . CHOLECYSTECTOMY    . DILITATION & CURRETTAGE/HYSTROSCOPY WITH NOVASURE ABLATION N/A 12/15/2014   Procedure: DILATATION & CURETTAGE/HYSTEROSCOPY WITH ENDOMETRIAL  NOVASURE ABLATION;  Surgeon: Florian Buff, MD;  Location: [facility];  Service: Gynecology;  Laterality: N/A;  . DOPPLER ECHOCARDIOGRAPHY  07/30/2000 [place]   EF 55-66%,lv normal  . NM MYOCAR PERF WALL MOTION  10/22/2011  . OOPHORECTOMY  2008   lt    OB History    Gravida Para Term Preterm AB Living   6 3 3   3      SAB TAB Ectopic Multiple Live Births   3               Home Medications    Prior to Admission medications   Medication Sig Start Date End Date Taking? Authorizing Provider  acetaminophen (TYLENOL) 500 MG tablet Take 500 mg by mouth every 6 (six) hours as needed for mild pain or moderate pain.    Historical Provider, MD  albuterol (PROVENTIL HFA;VENTOLIN HFA) 108 (90 Base) MCG/ACT inhaler Inhale 1-2 puffs into the lungs every 6 (six) hours as needed for wheezing or shortness of breath. Patient not taking: Reported on 01/05/2016 05/15/15   Fransico Meadow, PA-C  aspirin EC 81 MG tablet Take 81 mg by mouth daily.      Historical Provider, MD  azelastine (ASTELIN) 0.1 % nasal spray SPRAY 2 SPRAYS IN EACH NOSTRIL TWICE A DAY AS NEEDED FOR ALLERGIES/CONGESTION 04/14/15   Historical Provider, MD  KLOR-CON M10 10 MEQ tablet TAKE ONE CAPSULE BY MOUTH EVERY DAY 12/19/15   Mihai Croitoru, MD  losartan-hydrochlorothiazide (HYZAAR) 50-12.5 MG tablet Take 1 tablet by mouth daily. 10/02/15   Historical Provider, MD  pantoprazole (PROTONIX) 40 MG tablet Take 1 tablet (40 mg total) by mouth daily. 03/28/15   Florian Buff, MD  propranolol ER (INDERAL LA) 160 MG SR capsule Take 1 capsule (160 mg total) by mouth 2 (two) times daily. 12/15/15   Mihai Croitoru, MD  sertraline (ZOLOFT) 50 MG tablet Take 50 mg by mouth every evening.     Historical Provider, MD  simvastatin (ZOCOR) 10 MG tablet Take 10 mg by mouth at bedtime.     Historical Provider, MD  sucralfate (CARAFATE) 1 G tablet Take 1 tablet by mouth 3 (three) times daily before meals. 01/08/15   Historical Provider, MD  valACYclovir (VALTREX)  500 MG tablet Take 1 tablet by mouth as needed. 07/09/15   Historical Provider, MD    Family History Family History  Problem Relation Age of Onset  . Hypertension Mother   . Heart attack Mother   . Seizures Father   . Hypertension Brother   . Hypertension Brother   . Hypertension Sister   . Early death Brother   . Hypertension Son     Social History Social History  Substance Use Topics  . Smoking status: Never Smoker  . Smokeless tobacco: Never Used  . Alcohol use Yes     Comment: Occasionally, once a month     Allergies   Septra [bactrim]   Review of Systems Review of Systems  Constitutional: Negative for appetite change and fever.  HENT: Negative for congestion.   Respiratory: Negative for shortness of breath.   Cardiovascular: Positive for palpitations. Negative for chest pain and leg swelling.  Gastrointestinal: Negative for abdominal pain.  Endocrine: Negative for polyphagia and polyuria.  Genitourinary: Negative for frequency.  Musculoskeletal: Negative for back pain.  Neurological: Negative for dizziness.  Hematological: Negative for adenopathy.  Psychiatric/Behavioral: Negative for agitation.     Physical Exam Updated Vital Signs BP 126/75   Pulse 87   Temp 98.3 F (36.8 C) (Oral)   Resp 18   Ht 5\' 7"  (1.702 m)   Wt 240 lb (108.9 kg)   LMP 02/05/2016   SpO2 100%   BMI 37.59 kg/m   Physical Exam  Constitutional: She appears well-developed and well-nourished.  HENT:  Head: Atraumatic.  Eyes: Pupils are equal, round, and reactive to light.  Neck: No JVD present. No thyromegaly present.  Cardiovascular: Normal rate.   Pulmonary/Chest: Effort normal.  Abdominal: Soft. There is no tenderness.  Musculoskeletal: She exhibits no edema.  Neurological: She is alert.  Skin: Skin is warm. Capillary refill takes less than 2 seconds.     ED Treatments / Results  Labs (all labs ordered are listed, but only abnormal results are displayed) Labs  Reviewed  I-STAT CHEM 8, ED - Abnormal; Notable for the following:       Result Value   Creatinine, Ser 1.10 (*)    Glucose, Bld 143 (*)    Hemoglobin 11.6 (*)    HCT 34.0 (*)    All other components within normal limits  I-STAT TROPOININ, ED    EKG  EKG Interpretation  Date/Time:  Thursday February 23 2016 17:05:17 EST Ventricular Rate:  93 PR Interval:    QRS Duration: 106 QT Interval:  360 QTC Calculation: 448 R Axis:   29 Text Interpretation:  Sinus rhythm Abnormal R-wave progression, early transition Baseline wander in lead(s) II Confirmed by Alvino Chapel  MD, Ovid Curd [id number]) on 02/23/2016 5:36:31 PM       Radiology No results found.  Procedures Procedures (  including critical care time)  Medications Ordered in ED Medications - No data to display   Initial Impression / Assessment and Plan / ED Course  I have reviewed the triage vital signs and the nursing notes.  Pertinent labs & imaging results that were available during my care of the patient were reviewed by me and considered in my medical decision making (see chart for details).  Clinical Course     Patient with palpitations. History of same. No history of coronary artery disease. Labs reassuring. Has had previous hypokalemia. Will discharge home.  Final Clinical Impressions(s) / ED Diagnoses   Final diagnoses:  Heart palpitations    New Prescriptions Discharge Medication List as of 02/23/2016  6:02 PM       Davonna Belling, MD 02/23/16 [phone]

## 2016-02-23 NOTE — ED Triage Notes (Signed)
Pt reports having onset of palpitations and rapid heartbeat at 3pm today.  Was started on Prednisone yesterday for heel spur.

## 2016-02-23 NOTE — ED Notes (Signed)
ED Provider at bedside. 

## 2016-03-23 ENCOUNTER — Ambulatory Visit (INDEPENDENT_AMBULATORY_CARE_PROVIDER_SITE_OTHER): Payer: PRIVATE HEALTH INSURANCE | Admitting: Cardiovascular Disease

## 2016-03-23 ENCOUNTER — Encounter: Payer: Self-pay | Admitting: Cardiovascular Disease

## 2016-03-23 VITALS — BP 118/81 | HR 70 | Ht 67.0 in | Wt 237.8 lb

## 2016-03-23 DIAGNOSIS — I10 Essential (primary) hypertension: Secondary | ICD-10-CM | POA: Diagnosis not present

## 2016-03-23 DIAGNOSIS — E782 Mixed hyperlipidemia: Secondary | ICD-10-CM

## 2016-03-23 DIAGNOSIS — E669 Obesity, unspecified: Secondary | ICD-10-CM | POA: Diagnosis not present

## 2016-03-23 DIAGNOSIS — E876 Hypokalemia: Secondary | ICD-10-CM

## 2016-03-23 NOTE — Patient Instructions (Signed)
Dr Croitoru recommends that you schedule a follow-up appointment in 12 months. You will receive a reminder letter in the mail two months in advance. If you don't receive a letter, please call our office to schedule the follow-up appointment.  If you need a refill on your cardiac medications before your next appointment, please call your pharmacy. 

## 2016-03-23 NOTE — Progress Notes (Signed)
[name], female   DOB: [birth year], 50 y.o.   MRN: [id number]    Cardiology Office Note    Date:  03/24/2016   ID:  Summer Carpenter, DOB November 11, 1965, MRN [id number]  PCP:  [facility]  Cardiologist:   Sanda Klein, MD   Chief complaint: palpitations and HBP follow up   History of Present Illness:  Summer Carpenter is a 50 y.o. female with palpitations that were often associated with hypokalemia, presumably diuretic related. Did not do well when we stopped HCTZ. Spironolactone added several months ago, but she has stopped it again (no clear explanation). She was seen in the emergency room on November 16 for palpitations and the potassium was low normal at 3.6. Otherwise negative workup.  Palpitations are currently very well controlled on propranolol and she does not have complaints of fatigue.  Previous monitoring has not shown any evidence of serious arrhythmia. She has normal left ventricular size, wall thickness, systolic and diastolic function and no valvular abnormalities by echocardiography performed in September 2014. She is compliant with a statin for hyperlipidemia. Nebivolol was not as effective at symptom control.   Past Medical History:  Diagnosis Date  . Anxiety   . GERD (gastroesophageal reflux disease)   . Hyperlipidemia   . Hypertension   . Obesity   . Palpitations   . Prediabetes   . Sleep apnea   . Sleep apnea     Past Surgical History:  Procedure Laterality Date  . CARDIAC CATHETERIZATION  11/25/2001   norm. LV fx,norm. abd aorta, both renal arteries patent  . CERVICAL POLYPECTOMY N/A 12/15/2014   Procedure: ENDOMETRIAL POLYPECTOMY;  Surgeon: Florian Buff, MD;  Location: [facility];  Service: Gynecology;  Laterality: N/A;  . CHOLECYSTECTOMY    . DILITATION & CURRETTAGE/HYSTROSCOPY WITH NOVASURE ABLATION N/A 12/15/2014   Procedure: DILATATION & CURETTAGE/HYSTEROSCOPY WITH ENDOMETRIAL NOVASURE ABLATION;   Surgeon: Florian Buff, MD;  Location: [facility];  Service: Gynecology;  Laterality: N/A;  . DOPPLER ECHOCARDIOGRAPHY  07/30/2000 [place]   EF 55-66%,lv normal  . NM MYOCAR PERF WALL MOTION  10/22/2011  . OOPHORECTOMY  2008   lt    Current Outpatient Prescriptions  Medication Sig Dispense Refill  . acetaminophen (TYLENOL) 500 MG tablet Take 500 mg by mouth every 6 (six) hours as needed for mild pain or moderate pain.    Marland Kitchen albuterol (PROVENTIL HFA;VENTOLIN HFA) 108 (90 Base) MCG/ACT inhaler Inhale 1-2 puffs into the lungs every 6 (six) hours as needed for wheezing or shortness of breath. 1 Inhaler 0  . aspirin EC 81 MG tablet Take 81 mg by mouth daily.      Marland Kitchen azelastine (ASTELIN) 0.1 % nasal spray SPRAY 2 SPRAYS IN EACH NOSTRIL TWICE A DAY AS NEEDED FOR ALLERGIES/CONGESTION  12  . KLOR-CON M10 10 MEQ tablet TAKE ONE CAPSULE BY MOUTH EVERY DAY 90 tablet 2  . losartan-hydrochlorothiazide (HYZAAR) 50-12.5 MG tablet Take 1 tablet by mouth daily.  6  . pantoprazole (PROTONIX) 40 MG tablet Take 1 tablet (40 mg total) by mouth daily. 90 tablet 3  . propranolol ER (INDERAL LA) 160 MG SR capsule Take 1 capsule (160 mg total) by mouth 2 (two) times daily. 180 capsule 1  . sertraline (ZOLOFT) 50 MG tablet Take 50 mg by mouth every evening.     . simvastatin (ZOCOR) 10 MG tablet Take 10 mg by mouth at bedtime.     . sucralfate (CARAFATE) 1 G  tablet Take 1 tablet by mouth 3 (three) times daily before meals.  0  . valACYclovir (VALTREX) 500 MG tablet Take 1 tablet by mouth as needed.     No current facility-administered medications for this visit.     Allergies:   Septra [bactrim]   Social History   Social History  . Marital status: Single    Spouse name: N/A  . Number of children: N/A  . Years of education: N/A   Social History Main Topics  . Smoking status: Never Smoker  . Smokeless tobacco: Never Used  . Alcohol use Yes     Comment: Occasionally, once a month  . Drug use: No  . Sexual  activity: Yes    Birth control/ protection: Condom, None   Other Topics Concern  . None   Social History Narrative  . None     Family History:  The patient's family history includes Early death in her brother; Heart attack in her mother; Hypertension in her brother, brother, mother, sister, and son; Seizures in her father.   ROS:   Please see the history of present illness.    Review of Systems  All other systems reviewed and are negative.   PHYSICAL EXAM:   VS:  BP 118/81   Pulse 70   Ht 5\' 7"  (1.702 m)   Wt 237 lb 12.8 oz (107.9 kg)   LMP 02/05/2016   BMI 37.24 kg/m    GEN: Well nourished, well developed, in no acute distress  HEENT: normal  Neck: no JVD, carotid bruits, or masses Cardiac: RRR; no murmurs, rubs, or gallops,no edema  Respiratory:  clear to auscultation bilaterally, normal work of breathing GI: soft, nontender, nondistended, + BS MS: no deformity or atrophy  Skin: warm and dry, no rash Neuro:  Alert and Oriented x 3, Strength and sensation are intact Psych: euthymic mood, full affect  Wt Readings from Last 3 Encounters:  03/23/16 237 lb 12.8 oz (107.9 kg)  02/23/16 240 lb (108.9 kg)  01/05/16 241 lb 4.8 oz (109.5 kg)      Studies/Labs Reviewed:   EKG:  EKG is not ordered today.    Recent Labs: 07/12/2015: ALT 17; Platelets 402 02/23/2016: BUN 17; Creatinine, Ser 1.10; Hemoglobin 11.6; Potassium 3.6; Sodium 141    ASSESSMENT:    1. Essential hypertension   2. Hypokalemia   3. Mixed hyperlipidemia   4. Obesity (BMI 35.0-39.9 without comorbidity)      PLAN:  In order of problems listed above:  1. HTN: Her blood pressure is in the desirable range. I again suggested that we could add spironolactone rather than losartan for blood pressure control since this will likely lead to better potassium levels. She prefers not change medications. 2. Hypokalemia: Has problems with palpitations due to isolated ectopy when her potassium is low. Encouraged  potassium-rich foods. 3. HLP: On statin. Labs followed by PCP. 4. Obesity: Some progress, encouraged additional weight loss. 5. OSA: Follow-up sleep study a year ago did not suggest need for CPAP, but Dr. Claiborne Billings recommended evaluation for an oral appliance with Dr. Ron Parker.   Medication Adjustments/Labs and Tests Ordered: Current medicines are reviewed at length with the patient today.  Concerns regarding medicines are outlined above.  Medication changes, Labs and Tests ordered today are listed below. Patient Instructions  Dr Sallyanne Kuster recommends that you schedule a follow-up appointment in 12 months. You will receive a reminder letter in the mail two months in advance. If you don't receive a letter,  please call our office to schedule the follow-up appointment.  If you need a refill on your cardiac medications before your next appointment, please call your pharmacy.     Signed, Sanda Klein, MD  03/24/2016 10:53 AM    [facility] [place], [place], [place]  [address] Phone: [phone]; Fax: [phone]

## 2016-04-19 ENCOUNTER — Encounter: Payer: Self-pay | Admitting: Student

## 2016-04-19 ENCOUNTER — Telehealth: Payer: Self-pay | Admitting: Cardiovascular Disease

## 2016-04-19 ENCOUNTER — Ambulatory Visit (INDEPENDENT_AMBULATORY_CARE_PROVIDER_SITE_OTHER): Payer: PRIVATE HEALTH INSURANCE | Admitting: Student

## 2016-04-19 VITALS — BP 123/85 | HR 75 | Ht 67.0 in | Wt 237.4 lb

## 2016-04-19 DIAGNOSIS — Z79899 Other long term (current) drug therapy: Secondary | ICD-10-CM

## 2016-04-19 DIAGNOSIS — I10 Essential (primary) hypertension: Secondary | ICD-10-CM

## 2016-04-19 DIAGNOSIS — R002 Palpitations: Secondary | ICD-10-CM | POA: Diagnosis not present

## 2016-04-19 MED ORDER — PROPRANOLOL HCL 20 MG PO TABS: ORAL_TABLET | ORAL | 1 refills | Status: DC

## 2016-04-19 NOTE — Telephone Encounter (Signed)
New Message Patient c/o Palpitations:  High priority if patient c/o lightheadedness and shortness of breath.  1. How long have you been having palpitations? Two Weeks  2. Are you currently experiencing lightheadedness and shortness of breath? Just shortness of breath don't hear SOB over phone  3. Have you checked your BP and heart rate? (document readings) 117/75 Last week when it was checked @ PCP  4. Are you experiencing any other symptoms? No

## 2016-04-19 NOTE — Progress Notes (Signed)
Cardiology Office Note    Date:  04/19/2016   ID:  Summer Carpenter, DOB 1965-08-07, MRN [id number]  PCP:  [facility]  Cardiologist: Dr. Sallyanne Kuster   Chief Complaint  Patient presents with  . Palpitations    History of Present Illness:    Summer Carpenter is a 51 y.o. female with past medical history of palpitations, HTN, HLD, and hypokalemia who presents to the office today for evaluation of palpitations.   Was last seen by Dr. Sallyanne Kuster on 03/23/2016 and reported her palpitations were well-controlled. It was suggested at that time to add Spironolactone to her medication regimen in place of Losartan, as she is on HCTZ but she declined.   In talking with the patient today, she reports having worsening episodes of palpitations for the past 2 weeks. Denies any associated chest discomfort, dyspnea, lightheadedness, dizziness, or presyncope. Reports having 5-10 episodes of palpitations per day. The episodes can last from a few seconds up to 20 minutes.  She reports good compliance with her medications, but has been taking both tablets of her Inderal LA 160mg  once daily (in the morning). Says her palpitations are worse in the evening hours before going to sleep. Reports being under increased stress as she just started a 2nd job.   TSH was recently checked by her PCP and "normal" according to the patient. K+ was low-normal and she is on K+ supplementation.    Past Medical History:  Diagnosis Date  . Anxiety   . GERD (gastroesophageal reflux disease)   . History of echocardiogram    a. 12/2012: EF 55-60%, no WMA, trivial TR  . Hyperlipidemia   . Hypertension   . Obesity   . Palpitations   . Prediabetes   . Sleep apnea     Past Surgical History:  Procedure Laterality Date  . CARDIAC CATHETERIZATION  11/25/2001   norm. LV fx,norm. abd aorta, both renal arteries patent  . CERVICAL POLYPECTOMY N/A 12/15/2014   Procedure: ENDOMETRIAL POLYPECTOMY;   Surgeon: Florian Buff, MD;  Location: [facility];  Service: Gynecology;  Laterality: N/A;  . CHOLECYSTECTOMY    . DILITATION & CURRETTAGE/HYSTROSCOPY WITH NOVASURE ABLATION N/A 12/15/2014   Procedure: DILATATION & CURETTAGE/HYSTEROSCOPY WITH ENDOMETRIAL NOVASURE ABLATION;  Surgeon: Florian Buff, MD;  Location: [facility];  Service: Gynecology;  Laterality: N/A;  . DOPPLER ECHOCARDIOGRAPHY  07/30/2000 [place]   EF 55-66%,lv normal  . NM MYOCAR PERF WALL MOTION  10/22/2011  . OOPHORECTOMY  2008   lt    Current Medications: Outpatient Medications Prior to Visit  Medication Sig Dispense Refill  . acetaminophen (TYLENOL) 500 MG tablet Take 500 mg by mouth every 6 (six) hours as needed for mild pain or moderate pain.    Marland Kitchen albuterol (PROVENTIL HFA;VENTOLIN HFA) 108 (90 Base) MCG/ACT inhaler Inhale 1-2 puffs into the lungs every 6 (six) hours as needed for wheezing or shortness of breath. 1 Inhaler 0  . aspirin EC 81 MG tablet Take 81 mg by mouth daily.      Marland Kitchen azelastine (ASTELIN) 0.1 % nasal spray SPRAY 2 SPRAYS IN EACH NOSTRIL TWICE A DAY AS NEEDED FOR ALLERGIES/CONGESTION  12  . KLOR-CON M10 10 MEQ tablet TAKE ONE CAPSULE BY MOUTH EVERY DAY 90 tablet 2  . losartan-hydrochlorothiazide (HYZAAR) 50-12.5 MG tablet Take 1 tablet by mouth daily.  6  . pantoprazole (PROTONIX) 40 MG tablet Take 1 tablet (40 mg total) by mouth daily. 90 tablet 3  . propranolol  ER (INDERAL LA) 160 MG SR capsule Take 1 capsule (160 mg total) by mouth 2 (two) times daily. 180 capsule 1  . sertraline (ZOLOFT) 50 MG tablet Take 50 mg by mouth every evening.     . simvastatin (ZOCOR) 10 MG tablet Take 10 mg by mouth at bedtime.     . sucralfate (CARAFATE) 1 G tablet Take 1 tablet by mouth 3 (three) times daily before meals.  0  . valACYclovir (VALTREX) 500 MG tablet Take 1 tablet by mouth as needed.     No facility-administered medications prior to visit.      Allergies:   Septra [bactrim] and Sulfamethoxazole-trimethoprim    Social History   Social History  . Marital status: Single    Spouse name: N/A  . Number of children: N/A  . Years of education: N/A   Social History Main Topics  . Smoking status: Never Smoker  . Smokeless tobacco: Never Used  . Alcohol use Yes     Comment: Occasionally, once a month  . Drug use: No  . Sexual activity: Yes    Birth control/ protection: Condom, None   Other Topics Concern  . None   Social History Narrative  . None     Family History:  The patient's family history includes Early death in her brother; Heart attack in her mother; Hypertension in her brother, brother, mother, sister, and son; Seizures in her father.   Review of Systems:   Please see the history of present illness.     General:  No chills, fever, night sweats or weight changes.  Cardiovascular:  No chest pain, dyspnea on exertion, edema, orthopnea, paroxysmal nocturnal dyspnea. Positive for palpitations.  Dermatological: No rash, lesions/masses Respiratory: No cough, dyspnea Urologic: No hematuria, dysuria Abdominal:   No nausea, vomiting, diarrhea, bright red blood per rectum, melena, or hematemesis Neurologic:  No visual changes, wkns, changes in mental status. All other systems reviewed and are otherwise negative except as noted above.   Physical Exam:    VS:  BP 123/85   Pulse 75   Ht 5\' 7"  (1.702 m)   Wt 237 lb 6.4 oz (107.7 kg)   BMI 37.18 kg/m    General: Well developed, well nourished [place] American female appearing in no acute distress. Head: Normocephalic, atraumatic, sclera non-icteric, no xanthomas, nares are without discharge.  Neck: No carotid bruits. JVD not elevated.  Lungs: Respirations regular and unlabored, without wheezes or rales.  Heart: Regular rate and rhythm. No S3 or S4.  No murmur, no rubs, or gallops appreciated. Abdomen: Soft, non-tender, non-distended with normoactive bowel sounds. No hepatomegaly. No rebound/guarding. No obvious abdominal  masses. Msk:  Strength and tone appear normal for age. No joint deformities or effusions. Extremities: No clubbing or cyanosis. No edema.  Distal pedal pulses are 2+ bilaterally. Neuro: Alert and oriented X 3. Moves all extremities spontaneously. No focal deficits noted. Psych:  Responds to questions appropriately with a normal affect. Skin: No rashes or lesions noted  Wt Readings from Last 3 Encounters:  04/19/16 237 lb 6.4 oz (107.7 kg)  03/23/16 237 lb 12.8 oz (107.9 kg)  02/23/16 240 lb (108.9 kg)     Studies/Labs Reviewed:   EKG:  EKG is ordered today.  The ekg ordered today demonstrates NSR, HR 75, with no acute ST or T-wave changes.   Recent Labs: 07/12/2015: ALT 17; Platelets 402 02/23/2016: BUN 17; Creatinine, Ser 1.10; Hemoglobin 11.6; Potassium 3.6; Sodium 141   Lipid Panel No results  found for: CHOL, TRIG, HDL, CHOLHDL, VLDL, LDLCALC, LDLDIRECT  Additional studies/ records that were reviewed today include:   Echocardiogram: 12/2012 Study Conclusions  - Left ventricle: The cavity size was normal. Wall thickness was normal. Systolic function was normal. The estimated ejection fraction was in the range of 55% to 60%. Wall motion was normal; there were no regional wall motion abnormalities. Left ventricular diastolic function parameters were normal. - Mitral valve: Thickening and redundancy of the mitral leaflets. Trace to mildregurgitation. - Left atrium: LA volume/ BSA = 24.0 ml/m2 The atrium was normal in size. - Tricuspid valve: Trivial regurgitation. - Pulmonary arteries: PA peak pressure: 73mm Hg (S). - Inferior vena cava: The vessel was normal in size; the respirophasic diameter changes were in the normal range (= 50%); findings are consistent with normal central venous pressure.  Assessment:    1. Palpitations   2. Essential hypertension   3. Medication management      Plan:   In order of problems listed above:  1.  Palpitations - reports worsening palpitations over the past 2 weeks in the setting of increased work stress. Episodes are occurring 5-10 times per day and lasting a few seconds up to 20 minutes. She denies any associated chest discomfort, dyspnea, lightheadedness, dizziness, or presyncope.  - she has been taking Inderal LA 320mg  once daily (in the morning) with palpitations being noticed more in the evening hours. Half-life of the medication is 8-10 hours. I advised her to take one tablet in the morning and the other in the evening hours to see if this helps with her symptoms. Will provide Rx for short-acting Propranolol as needed for break through episodes. Recheck BMET today to assess K+ levels as this has been a trigger for her palpitations in the past. TSH recently checked and normal.   2. Essential HTN - BP well-controlled at 123/85 today. - continue Losartan-HCTZ 50-12.5mg  daily and Inderal LA 160mg  BID.    Medication Adjustments/Labs and Tests Ordered: Current medicines are reviewed at length with the patient today.  Concerns regarding medicines are outlined above.  Medication changes, Labs and Tests ordered today are listed in the Patient Instructions below. Patient Instructions  Medication Instructions:  START Inderal-LA (Propranolol ER) take 1 tablet in the morning and one tablet in the evening at least 12 hours apart START Propranolol 20mg  take 1 tab once a day up to 3 tablets for palpitations.   Labwork: Your physician recommends that you return for lab work in: Today BMET  Testing/Procedures: NONE    Follow-Up: Your physician wants you to follow-up in: December 2018 [place]. You will receive a reminder letter in the mail two months in advance. If you don't receive a letter, please call our office to schedule the follow-up appointment.  Any Other Special Instructions Will Be Listed Below (If Applicable).  If you need a refill on your cardiac medications before your  next appointment, please call your pharmacy.   Arna Medici, [place]  04/19/2016 5:39 PM    [facility] [place], [place], [place]  [address] Phone: [phone]; Fax: [phone]  [address] [place]  [place], [place] [address] Phone: [phone]

## 2016-04-19 NOTE — Progress Notes (Signed)
Thanks. Her potassium levels and palpitations were best when she was taking spironolactone, but she seems to periodically and repeatedly come off it. MCr

## 2016-04-19 NOTE — Telephone Encounter (Signed)
Spoke w patient, who notes she's had palpitations for about 2 weeks, associates these with stress. Notes also she sometimes is getting more short of breath with exertion. She identifies no other symptoms.  She has a history of palpitations w/ anxiety, no evidence to show any arrythmia. She was just seen by Dr. Sallyanne Kuster on 03/23/16 at which time her symptoms were well controlled on propranolol.  She voiced that she would like to be reseen. Dr. Victorino December schedule is full. Informed her I could set up w APP this week. She voiced agreement and will come in today for appt at 330p  Routed to provider for [place].

## 2016-04-19 NOTE — Patient Instructions (Addendum)
Medication Instructions:  START Inderal-LA (Propranolol ER) take 1 tablet in the morning and one tablet in the evening at least 12 hours apart START Propranolol 20mg  take 1 tab once a day  Labwork: Your physician recommends that you return for lab work in: Today BMET  Testing/Procedures: NONE    Follow-Up: Your physician wants you to follow-up in: December 2018 [place]. You will receive a reminder letter in the mail two months in advance. If you don't receive a letter, please call our office to schedule the follow-up appointment.  Any Other Special Instructions Will Be Listed Below (If Applicable).  If you need a refill on your cardiac medications before your next appointment, please call your pharmacy.

## 2016-05-10 ENCOUNTER — Other Ambulatory Visit (HOSPITAL_COMMUNITY): Payer: Self-pay | Admitting: Internal Medicine

## 2016-05-10 DIAGNOSIS — R634 Abnormal weight loss: Secondary | ICD-10-CM

## 2016-05-14 ENCOUNTER — Other Ambulatory Visit: Payer: Self-pay | Admitting: *Deleted

## 2016-05-16 ENCOUNTER — Other Ambulatory Visit: Payer: Self-pay | Admitting: *Deleted

## 2016-05-16 ENCOUNTER — Telehealth: Payer: Self-pay | Admitting: *Deleted

## 2016-05-16 MED ORDER — VALACYCLOVIR HCL 500 MG PO TABS: 500.0000 mg | ORAL_TABLET | Freq: Two times a day (BID) | ORAL | 6 refills | Status: DC

## 2016-05-16 NOTE — Telephone Encounter (Signed)
Informed patient prescription was refilled and sent to pharmacy.

## 2016-05-17 ENCOUNTER — Ambulatory Visit (HOSPITAL_COMMUNITY)
Admission: RE | Admit: 2016-05-17 | Discharge: 2016-05-17 | Disposition: A | Discharge status: Home / Self Care | Payer: PRIVATE HEALTH INSURANCE | Source: Ambulatory Visit | Attending: Internal Medicine | Admitting: Internal Medicine

## 2016-05-17 DIAGNOSIS — R634 Abnormal weight loss: Secondary | ICD-10-CM | POA: Diagnosis present

## 2016-05-17 MED ORDER — IOPAMIDOL (ISOVUE-300) INJECTION 61%
100.0000 mL | Freq: Once | INTRAVENOUS | Status: AC | PRN
  Administered 2016-05-17: 100 mL via INTRAVENOUS

## 2016-06-14 ENCOUNTER — Other Ambulatory Visit: Payer: Self-pay | Admitting: Cardiovascular Disease

## 2016-06-14 NOTE — Telephone Encounter (Signed)
Rx(s) sent to pharmacy electronically.  

## 2016-06-20 ENCOUNTER — Other Ambulatory Visit: Payer: Self-pay | Admitting: Obstetrics & Gynecology

## 2016-08-01 ENCOUNTER — Emergency Department (HOSPITAL_COMMUNITY): Payer: PRIVATE HEALTH INSURANCE

## 2016-08-01 ENCOUNTER — Other Ambulatory Visit: Payer: Self-pay

## 2016-08-01 ENCOUNTER — Emergency Department (HOSPITAL_COMMUNITY)
Admission: EM | Admit: 2016-08-01 | Discharge: 2016-08-01 | Disposition: A | Discharge status: Home / Self Care | Payer: PRIVATE HEALTH INSURANCE | Attending: Emergency Medicine | Admitting: Emergency Medicine

## 2016-08-01 ENCOUNTER — Encounter (HOSPITAL_COMMUNITY): Payer: Self-pay | Admitting: Cardiology

## 2016-08-01 DIAGNOSIS — Z79899 Other long term (current) drug therapy: Secondary | ICD-10-CM | POA: Diagnosis not present

## 2016-08-01 DIAGNOSIS — Z7982 Long term (current) use of aspirin: Secondary | ICD-10-CM | POA: Insufficient documentation

## 2016-08-01 DIAGNOSIS — I10 Essential (primary) hypertension: Secondary | ICD-10-CM | POA: Diagnosis not present

## 2016-08-01 DIAGNOSIS — R531 Weakness: Secondary | ICD-10-CM | POA: Diagnosis not present

## 2016-08-01 DIAGNOSIS — R0789 Other chest pain: Secondary | ICD-10-CM | POA: Insufficient documentation

## 2016-08-01 LAB — BASIC METABOLIC PANEL
Anion gap: 8 (ref 5–15)
BUN: 10 mg/dL (ref 6–20)
CHLORIDE: 102 mmol/L (ref 101–111)
CO2: 27 mmol/L (ref 22–32)
CREATININE: 0.99 mg/dL (ref 0.44–1.00)
Calcium: 8.9 mg/dL (ref 8.9–10.3)
GFR calc Af Amer: >60 mL/min (ref >60)
GFR calc non Af Amer: >60 mL/min (ref >60)
Glucose, Bld: 93 mg/dL (ref 65–99)
Potassium: 3 mmol/L — ABNORMAL LOW (ref 3.5–5.1)
SODIUM: 137 mmol/L (ref 135–145)

## 2016-08-01 LAB — CBC
HCT: 34.7 % — ABNORMAL LOW (ref 36.0–46.0)
Hemoglobin: 11.2 g/dL — ABNORMAL LOW (ref 12.0–15.0)
MCH: 26.3 pg (ref 26.0–34.0)
MCHC: 32.3 g/dL (ref 30.0–36.0)
MCV: 81.5 fL (ref 78.0–100.0)
PLATELETS: 390 10*3/uL (ref 150–400)
RBC: 4.26 MIL/uL (ref 3.87–5.11)
RDW: 16.7 % — AB (ref 11.5–15.5)
WBC: 11.8 10*3/uL — ABNORMAL HIGH (ref 4.0–10.5)

## 2016-08-01 LAB — HEPATIC FUNCTION PANEL
ALK PHOS: 70 U/L (ref 38–126)
ALT: 14 U/L (ref 14–54)
AST: 17 U/L (ref 15–41)
Albumin: 4 g/dL (ref 3.5–5.0)
BILIRUBIN DIRECT: 0.1 mg/dL (ref 0.1–0.5)
BILIRUBIN TOTAL: 0.6 mg/dL (ref 0.3–1.2)
Indirect Bilirubin: 0.5 mg/dL (ref 0.3–0.9)
Total Protein: 7.4 g/dL (ref 6.5–8.1)

## 2016-08-01 LAB — TROPONIN I
Troponin I: <0.03 ng/mL (ref <0.03)
Troponin I: <0.03 ng/mL (ref <0.03)

## 2016-08-01 LAB — LIPASE, BLOOD: Lipase: 28 U/L (ref 11–51)

## 2016-08-01 MED ORDER — TRAMADOL HCL 50 MG PO TABS
50.0000 mg | ORAL_TABLET | Freq: Four times a day (QID) | ORAL | 0 refills | Status: DC | PRN
Start: 2016-08-01 — End: 2016-10-09

## 2016-08-01 MED ORDER — HYDROCODONE-ACETAMINOPHEN 5-325 MG PO TABS
1.0000 | ORAL_TABLET | Freq: Once | ORAL | Status: AC
  Administered 2016-08-01: 1 via ORAL
  Filled 2016-08-01: qty 1

## 2016-08-01 MED ORDER — POTASSIUM CHLORIDE CRYS ER 20 MEQ PO TBCR
40.0000 meq | EXTENDED_RELEASE_TABLET | Freq: Once | ORAL | Status: AC
  Administered 2016-08-01: 40 meq via ORAL
  Filled 2016-08-01: qty 2

## 2016-08-01 NOTE — ED Triage Notes (Signed)
Left sided chest pain since Sunday.

## 2016-08-01 NOTE — ED Notes (Signed)
Pt ambulatory to waiting room. Pt verbalized understanding of discharge instructions.   

## 2016-08-01 NOTE — ED Provider Notes (Signed)
[facility] DEPT Provider Note   CSN: [id number] Arrival date & time: 08/01/16  1541     History   Chief Complaint Chief Complaint  Patient presents with  . Chest Pain    HPI Summer Carpenter is a 51 y.o. female.  Patient states that she's been having chest pain for couple days steady.. No sweating or shortness of breath.  The pain is not exertional.   The history is provided by the patient.  Chest Pain   This is a new problem. The current episode started more than 2 days ago. The problem occurs constantly. The problem has not changed since onset.Associated with: weakness. The pain is present in the substernal region. The pain is at a severity of 5/10. The pain is moderate. The quality of the pain is described as dull. Pertinent negatives include no abdominal pain, no back pain, no cough and no headaches.  Pertinent negatives for past medical history include no seizures.    Past Medical History:  Diagnosis Date  . Anxiety   . GERD (gastroesophageal reflux disease)   . History of echocardiogram    a. 12/2012: EF 55-60%, no WMA, trivial TR  . Hyperlipidemia   . Hypertension   . Obesity   . Palpitations   . Prediabetes   . Sleep apnea     Patient Active Problem List   Diagnosis Date Noted  . Well woman exam with routine gynecological exam 11/14/2015  . Encounter for other contraceptive management 02/04/2014  . Hypokalemia 08/28/2013  . Anemia 08/28/2013  . mild OSA (obstructive sleep apnea) 12/22/2012  . Fatigue 10/08/2012  . Snoring 10/08/2012  . Palpitations 09/14/2012  . Essential hypertension 09/14/2012  . Hyperlipidemia 09/14/2012  . Obesity (BMI 35.0-39.9 without comorbidity) 09/14/2012    Past Surgical History:  Procedure Laterality Date  . CARDIAC CATHETERIZATION  11/25/2001   norm. LV fx,norm. abd aorta, both renal arteries patent  . CERVICAL POLYPECTOMY N/A 12/15/2014   Procedure: ENDOMETRIAL POLYPECTOMY;  Surgeon: Florian Buff, MD;   Location: [facility];  Service: Gynecology;  Laterality: N/A;  . CHOLECYSTECTOMY    . DILITATION & CURRETTAGE/HYSTROSCOPY WITH NOVASURE ABLATION N/A 12/15/2014   Procedure: DILATATION & CURETTAGE/HYSTEROSCOPY WITH ENDOMETRIAL NOVASURE ABLATION;  Surgeon: Florian Buff, MD;  Location: [facility];  Service: Gynecology;  Laterality: N/A;  . DOPPLER ECHOCARDIOGRAPHY  07/30/2000 [place]   EF 55-66%,lv normal  . NM MYOCAR PERF WALL MOTION  10/22/2011  . OOPHORECTOMY  2008   lt    OB History    Gravida Para Term Preterm AB Living   6 3 3   3      SAB TAB Ectopic Multiple Live Births   3               Home Medications    Prior to Admission medications   Medication Sig Start Date End Date Taking? Authorizing Provider  acetaminophen (TYLENOL) 500 MG tablet Take 500 mg by mouth every 6 (six) hours as needed for mild pain or moderate pain.    Historical Provider, MD  albuterol (PROVENTIL HFA;VENTOLIN HFA) 108 (90 Base) MCG/ACT inhaler Inhale 1-2 puffs into the lungs every 6 (six) hours as needed for wheezing or shortness of breath. 05/15/15   Fransico Meadow, PA-C  aspirin EC 81 MG tablet Take 81 mg by mouth daily.      Historical Provider, MD  azelastine (ASTELIN) 0.1 % nasal spray SPRAY 2 SPRAYS IN EACH NOSTRIL TWICE A DAY AS NEEDED FOR ALLERGIES/CONGESTION 04/14/15  Historical Provider, MD  KLOR-CON M10 10 MEQ tablet TAKE ONE CAPSULE BY MOUTH EVERY DAY 12/19/15   Mihai Croitoru, MD  losartan-hydrochlorothiazide (HYZAAR) 50-12.5 MG tablet Take 1 tablet by mouth daily. 10/02/15   Historical Provider, MD  pantoprazole (PROTONIX) 40 MG tablet TAKE 1 TABLET (40 MG TOTAL) BY MOUTH DAILY. 06/20/16   Florian Buff, MD  propranolol (INDERAL) 20 MG tablet TAKE 1 TABLET AS NEEDED FOR PALPITATIONS WITH A MAX OF 3 A DAY 04/19/16   Erma Heritage, PA-C  propranolol ER (INDERAL LA) 160 MG SR capsule TAKE 1 CAPSULE (160 MG TOTAL) BY MOUTH 2 (TWO) TIMES DAILY. 06/14/16   Mihai Croitoru, MD  sertraline (ZOLOFT) 50 MG  tablet Take 50 mg by mouth every evening.     Historical Provider, MD  simvastatin (ZOCOR) 10 MG tablet Take 10 mg by mouth at bedtime.     Historical Provider, MD  sucralfate (CARAFATE) 1 G tablet Take 1 tablet by mouth 3 (three) times daily before meals. 01/08/15   Historical Provider, MD  traMADol (ULTRAM) 50 MG tablet Take 1 tablet (50 mg total) by mouth every 6 (six) hours as needed. 08/01/16   Milton Ferguson, MD  valACYclovir (VALTREX) 500 MG tablet Take 1 tablet by mouth as needed. 07/09/15   Historical Provider, MD  valACYclovir (VALTREX) 500 MG tablet Take 1 tablet (500 mg total) by mouth 2 (two) times daily. 05/16/16   Christin Fudge, CNM    Family History Family History  Problem Relation Age of Onset  . Hypertension Mother   . Heart attack Mother   . Seizures Father   . Hypertension Brother   . Hypertension Brother   . Hypertension Sister   . Early death Brother   . Hypertension Son     Social History Social History  Substance Use Topics  . Smoking status: Never Smoker  . Smokeless tobacco: Never Used  . Alcohol use Yes     Comment: Occasionally, once a month     Allergies   Septra [bactrim] and Sulfamethoxazole-trimethoprim   Review of Systems Review of Systems  Constitutional: Negative for appetite change and fatigue.  HENT: Negative for congestion, ear discharge and sinus pressure.   Eyes: Negative for discharge.  Respiratory: Negative for cough.   Cardiovascular: Positive for chest pain.  Gastrointestinal: Negative for abdominal pain and diarrhea.  Genitourinary: Negative for frequency and hematuria.  Musculoskeletal: Negative for back pain.  Skin: Negative for rash.  Neurological: Negative for seizures and headaches.  Psychiatric/Behavioral: Negative for hallucinations.     Physical Exam Updated Vital Signs BP 124/66   Pulse 81   Temp 98.6 F (37 C) (Oral)   Resp (!) 21   Ht 5\' 7"  (1.702 m)   Wt 237 lb (107.5 kg)   SpO2 100%   BMI 37.12  kg/m   Physical Exam  Constitutional: She is oriented to person, place, and time. She appears well-developed.  HENT:  Head: Normocephalic.  Eyes: Conjunctivae and EOM are normal. No scleral icterus.  Neck: Neck supple. No thyromegaly present.  Cardiovascular: Normal rate and regular rhythm.  Exam reveals no gallop and no friction rub.   No murmur heard. Pulmonary/Chest: No stridor. She has no wheezes. She has no rales. She exhibits tenderness.  Abdominal: She exhibits no distension. There is no tenderness. There is no rebound.  Musculoskeletal: Normal range of motion. She exhibits no edema.  Lymphadenopathy:    She has no cervical adenopathy.  Neurological: She is oriented to  person, place, and time. She exhibits normal muscle tone. Coordination normal.  Skin: No rash noted. No erythema.  Psychiatric: She has a normal mood and affect. Her behavior is normal.     ED Treatments / Results  Labs (all labs ordered are listed, but only abnormal results are displayed) Labs Reviewed  BASIC METABOLIC PANEL - Abnormal; Notable for the following:       Result Value   Potassium 3.0 (*)    All other components within normal limits  CBC - Abnormal; Notable for the following:    WBC 11.8 (*)    Hemoglobin 11.2 (*)    HCT 34.7 (*)    RDW 16.7 (*)    All other components within normal limits  TROPONIN I  HEPATIC FUNCTION PANEL  LIPASE, BLOOD  TROPONIN I    EKG  EKG Interpretation None       Radiology Dg Chest 2 View  Result Date: 08/01/2016 CLINICAL DATA:  Chest pain. EXAM: CHEST  2 VIEW COMPARISON:  Radiographs of July 12, 2015. FINDINGS: The heart size and mediastinal contours are within normal limits. Both lungs are clear. No pneumothorax or pleural effusion is noted. The visualized skeletal structures are unremarkable. IMPRESSION: No active cardiopulmonary disease. Electronically Signed   By: Marijo Conception, M.D.   On: 08/01/2016 16:52    Procedures Procedures (including  critical care time)  Medications Ordered in ED Medications  potassium chloride SA (K-DUR,KLOR-CON) CR tablet 40 mEq (40 mEq Oral Given 08/01/16 1708)  HYDROcodone-acetaminophen (NORCO/VICODIN) 5-325 MG per tablet 1 tablet (1 tablet Oral Given 08/01/16 1708)     Initial Impression / Assessment and Plan / ED Course  I have reviewed the triage vital signs and the nursing notes.  Pertinent labs & imaging results that were available during my care of the patient were reviewed by me and considered in my medical decision making (see chart for details).     Labs unremarkable. Suspect chest pain is chest wall tenderness. She'll be given Ultram and will follow-up with her PCP  Final Clinical Impressions(s) / ED Diagnoses   Final diagnoses:  None    New Prescriptions New Prescriptions   TRAMADOL (ULTRAM) 50 MG TABLET    Take 1 tablet (50 mg total) by mouth every 6 (six) hours as needed.     Milton Ferguson, MD 08/01/16 2032

## 2016-08-01 NOTE — Discharge Instructions (Signed)
Follow up with your heart md in 1 week for recheck

## 2016-08-01 NOTE — ED Notes (Signed)
Patient transported to X-ray 

## 2016-08-13 ENCOUNTER — Telehealth: Payer: Self-pay | Admitting: Cardiovascular Disease

## 2016-08-13 NOTE — Telephone Encounter (Signed)
Agree  Thank you

## 2016-08-13 NOTE — Telephone Encounter (Signed)
Returned call to patient.Stated for the past 1 week her ankles have been swollen.No sob.Weight down 7 lbs.She stays away from salt.B/P 130 / 75 to 78,pulse 68.She works all day and is on her feet,when she comes home she elevates legs and swelling goes down.Advised to wear compression stockings.I will send message to Dr.Croitoru for advice.

## 2016-08-13 NOTE — Telephone Encounter (Signed)
Patient calling, states that both her ankles are swollen and have been for about a week or so. Patient would like to know if she should increase medication. Please call to discuss, thanks.

## 2016-08-14 NOTE — Telephone Encounter (Signed)
Returned call to patient.Dr.Croitoru advised to wear compression stockings.Advised to call back if swelling continues.

## 2016-10-03 ENCOUNTER — Other Ambulatory Visit: Payer: Self-pay | Admitting: Obstetrics and Gynecology

## 2016-10-03 DIAGNOSIS — Z1231 Encounter for screening mammogram for malignant neoplasm of breast: Secondary | ICD-10-CM

## 2016-10-09 ENCOUNTER — Ambulatory Visit (INDEPENDENT_AMBULATORY_CARE_PROVIDER_SITE_OTHER): Payer: PRIVATE HEALTH INSURANCE | Admitting: Cardiology

## 2016-10-09 ENCOUNTER — Encounter: Payer: Self-pay | Admitting: Cardiology

## 2016-10-09 VITALS — BP 110/80 | HR 80 | Ht 67.0 in | Wt 236.0 lb

## 2016-10-09 DIAGNOSIS — R002 Palpitations: Secondary | ICD-10-CM | POA: Diagnosis not present

## 2016-10-09 DIAGNOSIS — IMO0001 Reserved for inherently not codable concepts without codable children: Secondary | ICD-10-CM | POA: Insufficient documentation

## 2016-10-09 DIAGNOSIS — R079 Chest pain, unspecified: Secondary | ICD-10-CM | POA: Diagnosis not present

## 2016-10-09 DIAGNOSIS — E669 Obesity, unspecified: Secondary | ICD-10-CM | POA: Diagnosis not present

## 2016-10-09 DIAGNOSIS — Z0389 Encounter for observation for other suspected diseases and conditions ruled out: Secondary | ICD-10-CM

## 2016-10-09 DIAGNOSIS — I10 Essential (primary) hypertension: Secondary | ICD-10-CM | POA: Diagnosis not present

## 2016-10-09 DIAGNOSIS — G4733 Obstructive sleep apnea (adult) (pediatric): Secondary | ICD-10-CM

## 2016-10-09 NOTE — Assessment & Plan Note (Signed)
BMI 36 

## 2016-10-09 NOTE — Assessment & Plan Note (Addendum)
History of recurrent symptomatic palpitations- stable since her medications were adjusted in Jan 2018

## 2016-10-09 NOTE — Assessment & Plan Note (Signed)
Sleep study done Feb 2017- no C-pap

## 2016-10-09 NOTE — Patient Instructions (Signed)
Medication Instructions: No changes  Follow-Up: Your physician wants you to follow-up in: one year with Dr. Sallyanne Kuster. You will receive a reminder letter in the mail two months in advance. If you don't receive a letter, please call our office to schedule the follow-up appointment.    If you need a refill on your cardiac medications before your next appointment, please call your pharmacy.

## 2016-10-09 NOTE — Assessment & Plan Note (Signed)
Normal coronaries 2003, low risk Myoview 2013, normal echo 2014

## 2016-10-09 NOTE — Assessment & Plan Note (Signed)
Pt seen in the office as a referral from her PCP with complaints of "stinging" mid sternal chest pain that lasts "seconds".

## 2016-10-09 NOTE — Assessment & Plan Note (Signed)
Controlled.  

## 2016-10-09 NOTE — Progress Notes (Signed)
10/09/2016 Summer Carpenter   [birth year]  [id number]  Primary Physician [facility]ologist: Dr Sallyanne Kuster  HPI:  Pleasant 51 y/o AA female with a history of HTN, HLD, and palpitations. She had a cath in 2003 that showed normal coronaries. A nuclear study in 2013 was negative. She has had palpitations off and on in the past but this is now under control after her medications were adjusted in Jan. She is in the offcie today as a referral from her PCP for chest pain. The pt was seen in the ED is April for chest pain that was felt to be atypical. She describes recurrent "stinging" mid sternal chest pain that lasts "seconds". Her symptoms are always at rest, not with exertion. She may have one or two episodes a day. She denies any unusual DOE or chest pressure or tightness.    Current Outpatient Prescriptions  Medication Sig Dispense Refill  . acetaminophen (TYLENOL) 500 MG tablet Take 500 mg by mouth every 6 (six) hours as needed for mild pain or moderate pain.    Marland Kitchen albuterol (PROVENTIL HFA;VENTOLIN HFA) 108 (90 Base) MCG/ACT inhaler Inhale 1-2 puffs into the lungs every 6 (six) hours as needed for wheezing or shortness of breath. 1 Inhaler 0  . aspirin EC 81 MG tablet Take 81 mg by mouth daily.      Marland Kitchen azelastine (ASTELIN) 0.1 % nasal spray SPRAY 2 SPRAYS IN EACH NOSTRIL TWICE A DAY AS NEEDED FOR ALLERGIES/CONGESTION  12  . KLOR-CON M10 10 MEQ tablet TAKE ONE CAPSULE BY MOUTH EVERY DAY 90 tablet 2  . losartan-hydrochlorothiazide (HYZAAR) 50-12.5 MG tablet Take 1 tablet by mouth daily.  6  . pantoprazole (PROTONIX) 40 MG tablet TAKE 1 TABLET (40 MG TOTAL) BY MOUTH DAILY. 90 tablet 3  . propranolol (INDERAL) 20 MG tablet TAKE 1 TABLET AS NEEDED FOR PALPITATIONS WITH A MAX OF 3 A DAY 90 tablet 1  . propranolol ER (INDERAL LA) 160 MG SR capsule TAKE 1 CAPSULE (160 MG TOTAL) BY MOUTH 2 (TWO) TIMES DAILY. (Patient taking differently: Take 160 mg by mouth  daily. ) 180 capsule 3  . sertraline (ZOLOFT) 50 MG tablet Take 50 mg by mouth every evening.     . simvastatin (ZOCOR) 10 MG tablet Take 10 mg by mouth at bedtime.     . sucralfate (CARAFATE) 1 G tablet Take 1 tablet by mouth 3 (three) times daily before meals.  0  . valACYclovir (VALTREX) 500 MG tablet Take 1 tablet by mouth as needed.     No current facility-administered medications for this visit.     Allergies  Allergen Reactions  . Septra [Bactrim] Hives and Diarrhea  . Sulfamethoxazole-Trimethoprim Hives and Diarrhea    Past Medical History:  Diagnosis Date  . Anxiety   . GERD (gastroesophageal reflux disease)   . History of echocardiogram    a. 12/2012: EF 55-60%, no WMA, trivial TR  . Hyperlipidemia   . Hypertension   . Obesity   . Palpitations   . Prediabetes   . Sleep apnea     Social History   Social History  . Marital status: Single    Spouse name: N/A  . Number of children: N/A  . Years of education: N/A   Occupational History  . Not on file.   Social History Main Topics  . Smoking status: Never Smoker  . Smokeless tobacco: Never Used  . Alcohol use Yes  Comment: Occasionally, once a month  . Drug use: No  . Sexual activity: Yes    Birth control/ protection: Condom, None   Other Topics Concern  . Not on file   Social History Narrative  . No narrative on file     Family History  Problem Relation Age of Onset  . Hypertension Mother   . Heart attack Mother   . Seizures Father   . Hypertension Brother   . Hypertension Brother   . Hypertension Sister   . Early death Brother   . Hypertension Son      Review of Systems: General: negative for chills, fever, night sweats or weight changes.  Cardiovascular: negative for chest pain, dyspnea on exertion, edema, orthopnea, palpitations, paroxysmal nocturnal dyspnea or shortness of breath Dermatological: negative for rash Respiratory: negative for cough or wheezing Urologic: negative for  hematuria Abdominal: negative for nausea, vomiting, diarrhea, bright red blood per rectum, melena, or hematemesis Neurologic: negative for visual changes, syncope, or dizziness All other systems reviewed and are otherwise negative except as noted above.    Blood pressure 110/80, pulse 80, height 5\' 7"  (1.702 m), weight 236 lb (107 kg), SpO2 96 %.  General appearance: alert, cooperative, no distress and mildly obese Neck: no carotid bruit and no JVD Lungs: clear to auscultation bilaterally Heart: regular rate and rhythm Abdomen: soft, non-tender; bowel sounds normal; no masses,  no organomegaly Extremities: extremities normal, atraumatic, no cyanosis or edema Pulses: 2+ and symmetric Skin: Skin color, texture, turgor normal. No rashes or lesions Neurologic: Grossly normal  EKG NSR- no acute changes  ASSESSMENT AND PLAN:   Chest pain at rest Pt seen in the office as a referral from her PCP with complaints of "stinging" mid sternal chest pain that lasts "seconds".  Normal coronary arteries Normal coronaries 2003, low risk Myoview 2013, normal echo 2014  Palpitations History of recurrent symptomatic palpitations- stable since her medications were adjusted in Jan 2018  Obesity (BMI 35.0-39.9 without comorbidity) BMI 36  Essential hypertension Controlled  mild OSA (obstructive sleep apnea) Sleep study done Feb 2017- no C-pap   PLAN  I reassured Ms Trzcinski that I felt her symptoms were not typical for angina. I recommended no further testing unless her symptoms become more typical. F/U one year or sooner if she develops more typical chest pain.   Kerin Ransom PA-C 10/09/2016 8:49 AM

## 2016-10-10 IMAGING — DX DG ABDOMEN ACUTE W/ 1V CHEST
3 series · 3 of 3 positions shown · non-contrast
Comparison: 11/13/2014

CLINICAL DATA: Flank and abdominal pain, initial encounter

EXAM:
DG ABDOMEN ACUTE W/ 1V CHEST

[chest pa]
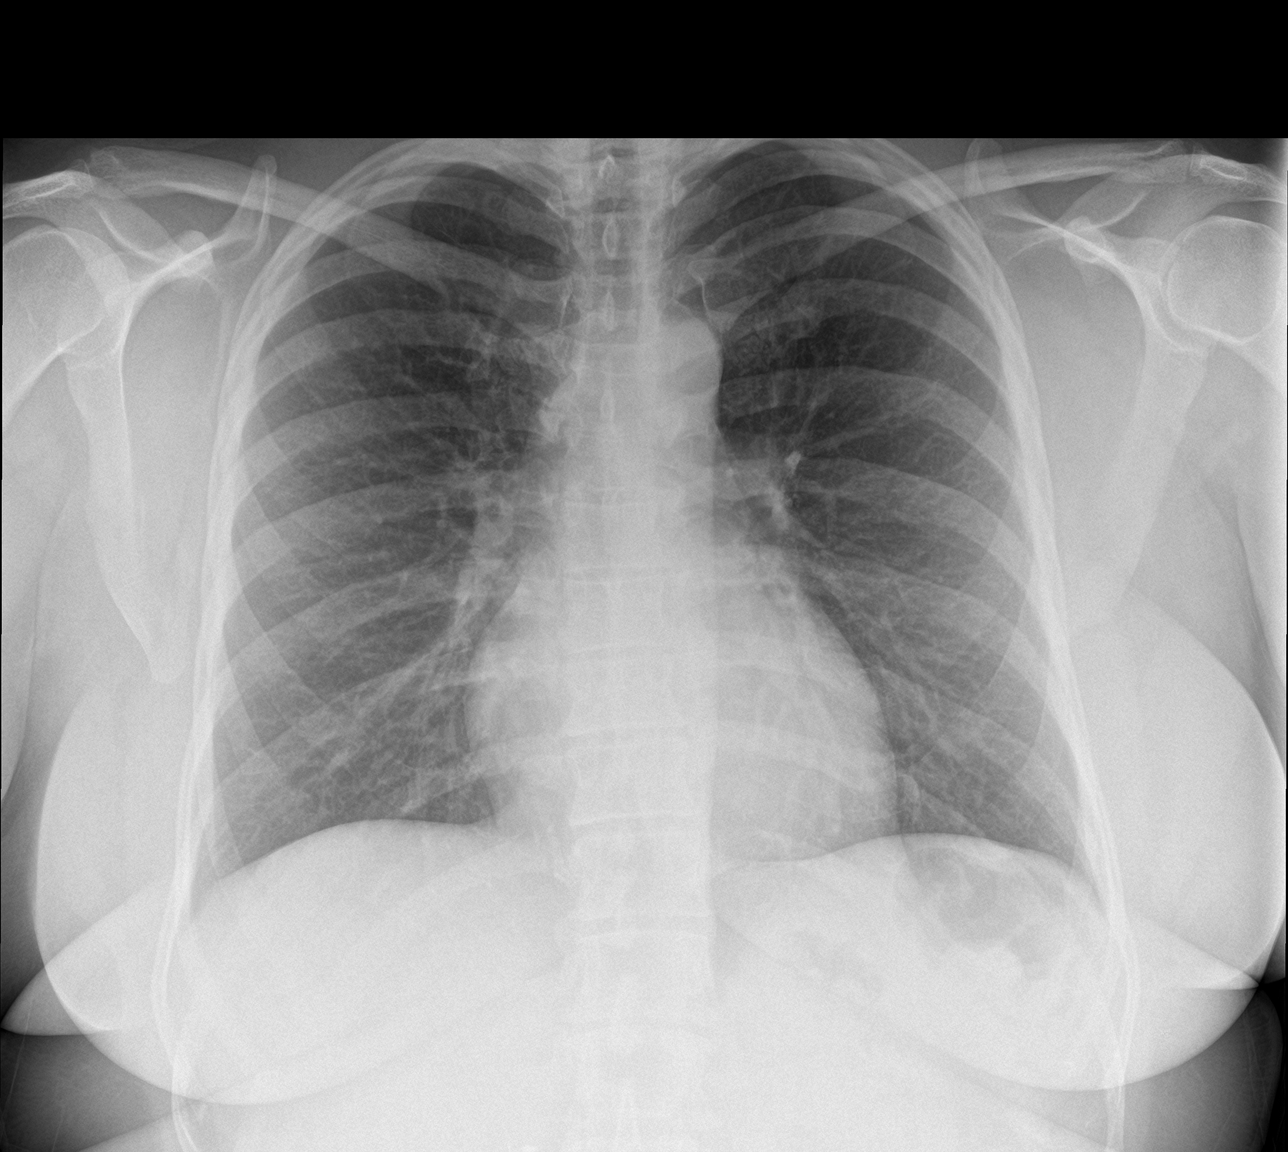

[abdomen erect]
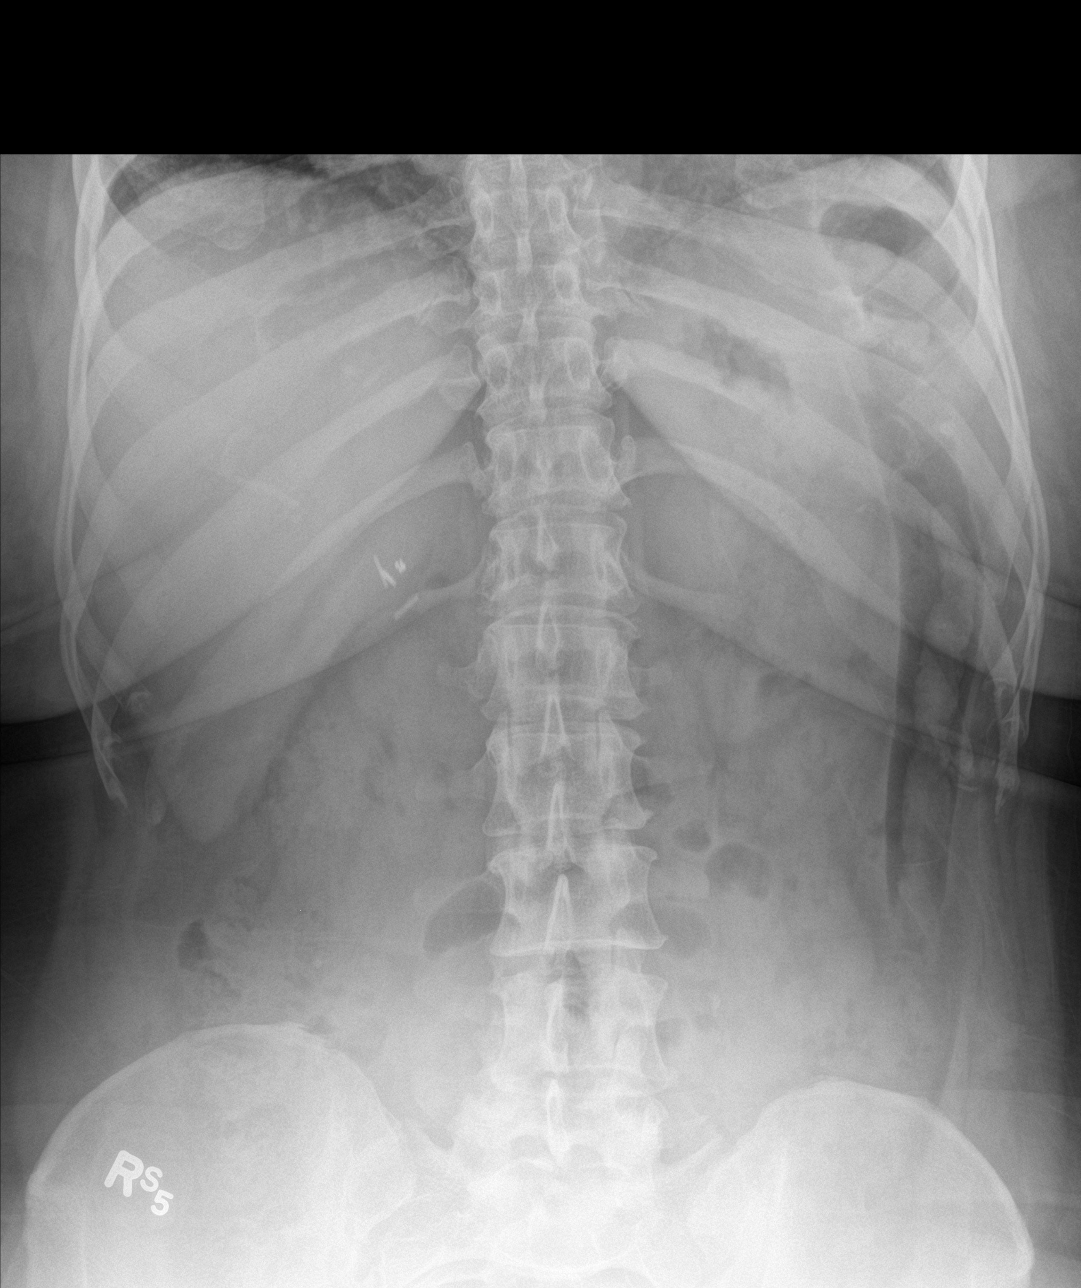

[abdomen supine]
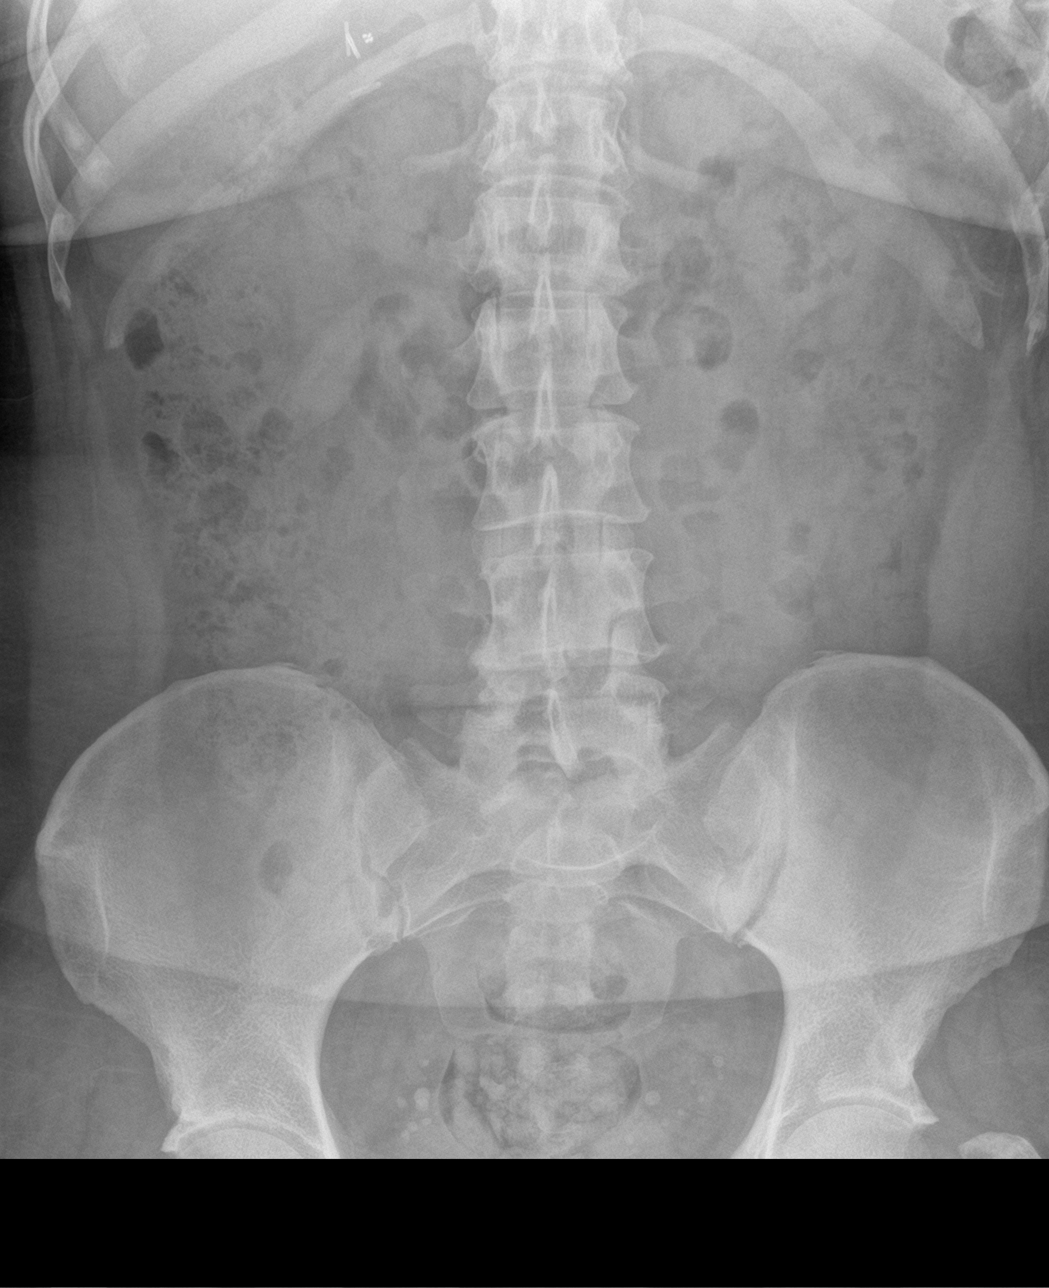

[3 of 3 positions shown; findings below may reference images not displayed]

FINDINGS: Cardiac shadow is within normal limits. The lungs are clear
bilaterally.

Scattered large and small bowel gas is noted. Fecal material is
noted within the colon without obstructive change. No abnormal mass
or abnormal calcifications are noted. No acute bony abnormality is
seen.
IMPRESSION: No acute abnormality noted.

## 2016-10-11 ENCOUNTER — Emergency Department (HOSPITAL_COMMUNITY): Payer: PRIVATE HEALTH INSURANCE

## 2016-10-11 ENCOUNTER — Encounter (HOSPITAL_COMMUNITY): Payer: Self-pay

## 2016-10-11 ENCOUNTER — Emergency Department (HOSPITAL_COMMUNITY)
Admission: EM | Admit: 2016-10-11 | Discharge: 2016-10-11 | Disposition: A | Discharge status: Home / Self Care | Payer: PRIVATE HEALTH INSURANCE | Attending: Emergency Medicine | Admitting: Emergency Medicine

## 2016-10-11 DIAGNOSIS — R0789 Other chest pain: Secondary | ICD-10-CM

## 2016-10-11 DIAGNOSIS — I10 Essential (primary) hypertension: Secondary | ICD-10-CM | POA: Diagnosis not present

## 2016-10-11 DIAGNOSIS — Z79899 Other long term (current) drug therapy: Secondary | ICD-10-CM | POA: Diagnosis not present

## 2016-10-11 DIAGNOSIS — Z7982 Long term (current) use of aspirin: Secondary | ICD-10-CM | POA: Diagnosis not present

## 2016-10-11 DIAGNOSIS — R202 Paresthesia of skin: Secondary | ICD-10-CM | POA: Diagnosis present

## 2016-10-11 DIAGNOSIS — R079 Chest pain, unspecified: Secondary | ICD-10-CM | POA: Insufficient documentation

## 2016-10-11 LAB — CBC
HCT: 32.6 % — ABNORMAL LOW (ref 36.0–46.0)
Hemoglobin: 10.5 g/dL — ABNORMAL LOW (ref 12.0–15.0)
MCH: 26.4 pg (ref 26.0–34.0)
MCHC: 32.2 g/dL (ref 30.0–36.0)
MCV: 82.1 fL (ref 78.0–100.0)
PLATELETS: 378 10*3/uL (ref 150–400)
RBC: 3.97 MIL/uL (ref 3.87–5.11)
RDW: 16 % — AB (ref 11.5–15.5)
WBC: 11.7 10*3/uL — ABNORMAL HIGH (ref 4.0–10.5)

## 2016-10-11 LAB — BASIC METABOLIC PANEL
Anion gap: 8 (ref 5–15)
BUN: 16 mg/dL (ref 6–20)
CHLORIDE: 104 mmol/L (ref 101–111)
CO2: 26 mmol/L (ref 22–32)
CREATININE: 1.08 mg/dL — AB (ref 0.44–1.00)
Calcium: 8.9 mg/dL (ref 8.9–10.3)
GFR calc Af Amer: >60 mL/min (ref >60)
GFR calc non Af Amer: 59 mL/min — ABNORMAL LOW (ref >60)
GLUCOSE: 98 mg/dL (ref 65–99)
Potassium: 3.6 mmol/L (ref 3.5–5.1)
Sodium: 138 mmol/L (ref 135–145)

## 2016-10-11 LAB — TROPONIN I: Troponin I: <0.03 ng/mL (ref <0.03)

## 2016-10-11 NOTE — Discharge Instructions (Signed)
Follow up with your doctor as we discussed, return to the ED as needed for worsening symptoms

## 2016-10-11 NOTE — ED Provider Notes (Signed)
[facility] DEPT Provider Note   CSN: [id number] Arrival date & time: 10/11/16  1505     History   Chief Complaint Chief Complaint  Patient presents with  . Tingling    HPI Summer Carpenter is a 51 y.o. female.  HPI Patient presents to the emergency room for evaluation of a tingling sensation in her left breast/chest area for the past couple of weeks. Symptoms have been intermittent. The episodes last maybe 10 seconds at a time. It is not painful it's tingling sensation underneath the left breast. Patient states she sometimes feels hot after that occurs. She denies any shortness of breath. No leg swelling. No abdominal pain. She went to see her cardiologist and she was told that everything looked okay. He had another episode today seemed worse so she was concerned something was going on.  Normal menses.  No known menopausal symptoms Past Medical History:  Diagnosis Date  . Anxiety   . GERD (gastroesophageal reflux disease)   . History of echocardiogram    a. 12/2012: EF 55-60%, no WMA, trivial TR  . Hyperlipidemia   . Hypertension   . Obesity   . Palpitations   . Prediabetes   . Sleep apnea     Patient Active Problem List   Diagnosis Date Noted  . Normal coronary arteries 10/09/2016  . Chest pain at rest 10/09/2016  . Well woman exam with routine gynecological exam 11/14/2015  . Encounter for other contraceptive management 02/04/2014  . Hypokalemia 08/28/2013  . Anemia 08/28/2013  . mild OSA (obstructive sleep apnea) 12/22/2012  . Fatigue 10/08/2012  . Snoring 10/08/2012  . Palpitations 09/14/2012  . Essential hypertension 09/14/2012  . Hyperlipidemia 09/14/2012  . Obesity (BMI 35.0-39.9 without comorbidity) 09/14/2012    Past Surgical History:  Procedure Laterality Date  . CARDIAC CATHETERIZATION  11/25/2001   norm. LV fx,norm. abd aorta, both renal arteries patent  . CERVICAL POLYPECTOMY N/A 12/15/2014   Procedure: ENDOMETRIAL POLYPECTOMY;  Surgeon:  Florian Buff, MD;  Location: [facility];  Service: Gynecology;  Laterality: N/A;  . CHOLECYSTECTOMY    . DILITATION & CURRETTAGE/HYSTROSCOPY WITH NOVASURE ABLATION N/A 12/15/2014   Procedure: DILATATION & CURETTAGE/HYSTEROSCOPY WITH ENDOMETRIAL NOVASURE ABLATION;  Surgeon: Florian Buff, MD;  Location: [facility];  Service: Gynecology;  Laterality: N/A;  . DOPPLER ECHOCARDIOGRAPHY  07/30/2000 [place]   EF 55-66%,lv normal  . NM MYOCAR PERF WALL MOTION  10/22/2011  . OOPHORECTOMY  2008   lt    OB History    Gravida Para Term Preterm AB Living   6 3 3   3      SAB TAB Ectopic Multiple Live Births   3               Home Medications    Prior to Admission medications   Medication Sig Start Date End Date Taking? Authorizing Provider  acetaminophen (TYLENOL) 500 MG tablet Take 1,000 mg by mouth every 6 (six) hours as needed for mild pain or moderate pain.    Yes [provider]  albuterol (PROVENTIL HFA;VENTOLIN HFA) 108 (90 Base) MCG/ACT inhaler Inhale 1-2 puffs into the lungs every 6 (six) hours as needed for wheezing or shortness of breath. 05/15/15  Yes Fransico Meadow, PA-C  aspirin EC 81 MG tablet Take 81 mg by mouth daily.     Yes [provider]  KLOR-CON M10 10 MEQ tablet TAKE ONE CAPSULE BY MOUTH EVERY DAY 12/19/15  Yes Croitoru, Dani Gobble, MD  losartan-hydrochlorothiazide (HYZAAR) 50-12.5  MG tablet Take 1 tablet by mouth daily. 10/02/15  Yes [provider]  pantoprazole (PROTONIX) 40 MG tablet TAKE 1 TABLET (40 MG TOTAL) BY MOUTH DAILY. 06/20/16  Yes Florian Buff, MD  propranolol (INDERAL) 20 MG tablet TAKE 1 TABLET AS NEEDED FOR PALPITATIONS WITH A MAX OF 3 A DAY 04/19/16  Yes Strader, [place] M, PA-C  propranolol ER (INDERAL LA) 160 MG SR capsule TAKE 1 CAPSULE (160 MG TOTAL) BY MOUTH 2 (TWO) TIMES DAILY. Patient taking differently: Take 160 mg by mouth daily.  06/14/16  Yes Croitoru, Mihai, MD  sertraline (ZOLOFT) 50 MG tablet Take 50 mg by mouth every evening.     Yes [provider]  simvastatin (ZOCOR) 10 MG tablet Take 10 mg by mouth at bedtime.    Yes [provider]  sucralfate (CARAFATE) 1 G tablet Take 1 tablet by mouth 3 (three) times daily before meals. 01/08/15  Yes [provider]  valACYclovir (VALTREX) 500 MG tablet Take 1 tablet by mouth as needed. 07/09/15  Yes [provider]    Family History Family History  Problem Relation Age of Onset  . Hypertension Mother   . Heart attack Mother   . Seizures Father   . Hypertension Brother   . Hypertension Brother   . Hypertension Sister   . Early death Brother   . Hypertension Son     Social History Social History  Substance Use Topics  . Smoking status: Never Smoker  . Smokeless tobacco: Never Used  . Alcohol use Yes     Comment: Occasionally, once a month     Allergies   Septra [bactrim] and Sulfamethoxazole-trimethoprim   Review of Systems Review of Systems  All other systems reviewed and are negative.    Physical Exam Updated Vital Signs BP (!) 132/91 (BP Location: Right Arm)   Pulse 72   Temp 98.1 F (36.7 C) (Oral)   Resp 18   Ht 1.702 m (5\' 7" )   Wt 107 kg (236 lb)   LMP 09/13/2016   SpO2 100%   BMI 36.96 kg/m   Physical Exam  Constitutional: She appears well-developed and well-nourished. No distress.  HENT:  Head: Normocephalic and atraumatic.  Right Ear: External ear normal.  Left Ear: External ear normal.  Eyes: Conjunctivae are normal. Right eye exhibits no discharge. Left eye exhibits no discharge. No scleral icterus.  Neck: Neck supple. No tracheal deviation present.  Cardiovascular: Normal rate, regular rhythm and intact distal pulses.   Pulmonary/Chest: Effort normal and breath sounds normal. No stridor. No respiratory distress. She has no wheezes. She has no rales.  Abdominal: Soft. Bowel sounds are normal. She exhibits no distension. There is no tenderness. There is no rebound and no guarding.    Musculoskeletal: She exhibits no edema or tenderness.  Neurological: She is alert. She has normal strength. No cranial nerve deficit (no facial droop, extraocular movements intact, no slurred speech) or sensory deficit. She exhibits normal muscle tone. She displays no seizure activity. Coordination normal.  Skin: Skin is warm and dry. No rash noted.  Psychiatric: She has a normal mood and affect.  Nursing note and vitals reviewed.    ED Treatments / Results  Labs (all labs ordered are listed, but only abnormal results are displayed) Labs Reviewed  BASIC METABOLIC PANEL - Abnormal; Notable for the following:       Result Value   Creatinine, Ser 1.08 (*)    GFR calc non Af Amer 59 (*)  All other components within normal limits  CBC - Abnormal; Notable for the following:    WBC 11.7 (*)    Hemoglobin 10.5 (*)    HCT 32.6 (*)    RDW 16.0 (*)    All other components within normal limits  TROPONIN I    EKG  EKG Interpretation  Date/Time:  Thursday October 11 2016 15:18:01 EDT Ventricular Rate:  71 PR Interval:  128 QRS Duration: 106 QT Interval:  396 QTC Calculation: 430 R Axis:   25 Text Interpretation:  Normal sinus rhythm Incomplete right bundle branch block Borderline ECG No significant change since last tracing Confirmed by Dorie Rank [phone]) on 10/11/2016 3:51:38 PM       Radiology Dg Chest 2 View  Result Date: 10/11/2016 CLINICAL DATA:  Palpitations. EXAM: CHEST  2 VIEW COMPARISON:  08/01/2016 FINDINGS: The heart size and mediastinal contours are within normal limits. Both lungs are clear. The visualized skeletal structures are unremarkable. IMPRESSION: No active cardiopulmonary disease. Electronically Signed   By: Misty Stanley M.D.   On: 10/11/2016 17:32    Procedures Procedures (including critical care time)  Medications Ordered in ED Medications - No data to display   Initial Impression / Assessment and Plan / ED Course  I have reviewed the triage vital signs  and the nursing notes.  Pertinent labs & imaging results that were available during my care of the patient were reviewed by me and considered in my medical decision making (see chart for details).  Sx are atypical for ACS.   Episodes are very brief, lasting 10 seconds and are described as a tingling.  Doubt PE, ACS, Dissection or other emergent condition.   Discussed outpatient follow up with PCP  Final Clinical Impressions(s) / ED Diagnoses   Final diagnoses:  Chest discomfort    New Prescriptions New Prescriptions   No medications on file     Dorie Rank, MD 10/11/16 [phone]

## 2016-10-11 NOTE — ED Triage Notes (Signed)
EKG given to Dr Zacowski 

## 2016-10-11 NOTE — ED Notes (Signed)
ED Provider at bedside. 

## 2016-10-11 NOTE — ED Triage Notes (Signed)
Pt reports that she has a tingling in her left breast/chest area that has been intermittent for the past 2 weeks. Pt reports that she went to cardiologist and he assured her there was nothing wrong. Pt reports that tingling seems worse today

## 2016-11-14 ENCOUNTER — Ambulatory Visit (INDEPENDENT_AMBULATORY_CARE_PROVIDER_SITE_OTHER): Payer: PRIVATE HEALTH INSURANCE | Admitting: Obstetrics and Gynecology

## 2016-11-14 ENCOUNTER — Other Ambulatory Visit (HOSPITAL_COMMUNITY)
Admission: RE | Admit: 2016-11-14 | Discharge: 2016-11-14 | Disposition: A | Discharge status: Home / Self Care | Payer: PRIVATE HEALTH INSURANCE | Source: Ambulatory Visit | Attending: Obstetrics and Gynecology | Admitting: Obstetrics and Gynecology

## 2016-11-14 ENCOUNTER — Encounter: Payer: Self-pay | Admitting: Obstetrics and Gynecology

## 2016-11-14 VITALS — BP 120/60 | HR 68 | Ht 67.5 in | Wt 238.2 lb

## 2016-11-14 DIAGNOSIS — Z01419 Encounter for gynecological examination (general) (routine) without abnormal findings: Secondary | ICD-10-CM

## 2016-11-14 DIAGNOSIS — Z1212 Encounter for screening for malignant neoplasm of rectum: Secondary | ICD-10-CM | POA: Diagnosis not present

## 2016-11-14 LAB — HEMOCCULT GUIAC POC 1CARD (OFFICE): Fecal Occult Blood, POC: NEGATIVE

## 2016-11-14 NOTE — Progress Notes (Signed)
Assessment:  Annual Gyn Exam   Plan:  1. pap smear done, next pap due in 3 years 2. return annually or prn 3    Annual mammogram advised 4.   Recommend family doctor to switch from HCTZ to Spironolactone  Subjective:  Summer Carpenter is a 51 y.o. female [phone] who presents for annual exam. Patient's last menstrual period was 11/11/2016 (exact date). The patient has no complaints today. Pt was seen in office 01/05/16 for an endo-ablation She states it has helped and her periods last 3-4 days now. Pt has a mammogram scheduled for 11/19/16. Pt is concerned about her weight, and states she is trying to lose some. Pt reports excessive facial hair growth, where she has to shave twice a week, causing some discoloration around her chin and neck.  The following portions of the patient's history were reviewed and updated as appropriate: allergies, current medications, past family history, past medical history, past social history, past surgical history and problem list. Past Medical History:  Diagnosis Date  . Anxiety   . GERD (gastroesophageal reflux disease)   . History of echocardiogram    a. 12/2012: EF 55-60%, no WMA, trivial TR  . Hyperlipidemia   . Hypertension   . Obesity   . Palpitations   . Prediabetes   . Sleep apnea     Past Surgical History:  Procedure Laterality Date  . CARDIAC CATHETERIZATION  11/25/2001   norm. LV fx,norm. abd aorta, both renal arteries patent  . CERVICAL POLYPECTOMY N/A 12/15/2014   Procedure: ENDOMETRIAL POLYPECTOMY;  Surgeon: Florian Buff, MD;  Location: [facility];  Service: Gynecology;  Laterality: N/A;  . CHOLECYSTECTOMY    . DILITATION & CURRETTAGE/HYSTROSCOPY WITH NOVASURE ABLATION N/A 12/15/2014   Procedure: DILATATION & CURETTAGE/HYSTEROSCOPY WITH ENDOMETRIAL NOVASURE ABLATION;  Surgeon: Florian Buff, MD;  Location: [facility];  Service: Gynecology;  Laterality: N/A;  . DOPPLER ECHOCARDIOGRAPHY  07/30/2000 [place]   EF 55-66%,lv normal  . NM  MYOCAR PERF WALL MOTION  10/22/2011  . OOPHORECTOMY  2008   lt     Current Outpatient Prescriptions:  .  acetaminophen (TYLENOL) 500 MG tablet, Take 1,000 mg by mouth every 6 (six) hours as needed for mild pain or moderate pain. , Disp: , Rfl:  .  albuterol (PROVENTIL HFA;VENTOLIN HFA) 108 (90 Base) MCG/ACT inhaler, Inhale 1-2 puffs into the lungs every 6 (six) hours as needed for wheezing or shortness of breath., Disp: 1 Inhaler, Rfl: 0 .  aspirin EC 81 MG tablet, Take 81 mg by mouth daily.  , Disp: , Rfl:  .  KLOR-CON M10 10 MEQ tablet, TAKE ONE CAPSULE BY MOUTH EVERY DAY, Disp: 90 tablet, Rfl: 2 .  losartan-hydrochlorothiazide (HYZAAR) 50-12.5 MG tablet, Take 1 tablet by mouth daily., Disp: , Rfl: 6 .  pantoprazole (PROTONIX) 40 MG tablet, TAKE 1 TABLET (40 MG TOTAL) BY MOUTH DAILY., Disp: 90 tablet, Rfl: 3 .  propranolol ER (INDERAL LA) 160 MG SR capsule, TAKE 1 CAPSULE (160 MG TOTAL) BY MOUTH 2 (TWO) TIMES DAILY., Disp: 180 capsule, Rfl: 3 .  sertraline (ZOLOFT) 50 MG tablet, Take 50 mg by mouth every evening. , Disp: , Rfl:  .  simvastatin (ZOCOR) 10 MG tablet, Take 10 mg by mouth at bedtime. , Disp: , Rfl:  .  sucralfate (CARAFATE) 1 G tablet, Take 1 tablet by mouth 3 (three) times daily before meals., Disp: , Rfl: 0 .  valACYclovir (VALTREX) 500 MG tablet, Take 1 tablet by mouth as  needed., Disp: , Rfl:   Review of Systems Constitutional: negative Gastrointestinal: negative Genitourinary: negative  Objective:  BP 120/60 (BP Location: Right Arm, Patient Position: Sitting, Cuff Size: Large)   Pulse 68   Ht 5' 7.5" (1.715 m)   Wt 238 lb 3.2 oz (108 kg)   LMP 11/11/2016 (Exact Date)   BMI 36.76 kg/m    BMI: Body mass index is 36.76 kg/m.  General Appearance: Alert, appropriate appearance for age. No acute distress HEENT: Grossly normal Neck / Thyroid:  Cardiovascular: RRR; normal S1, S2, no murmur Lungs: CTA bilaterally Back: No CVAT Breast Exam: No masses or nodes.No  dimpling, nipple retraction or discharge. Gastrointestinal: Soft, non-tender, no masses or organomegaly Pelvic Exam:  External genitalia: normal general appearanceNo clitorimegaly, , light menstrual blood Vaginal: normal mucosa without prolapse or lesions, good support Cervix: normal appearance Adnexa: normal bimanual exam Uterus: normal single, non-tender, excellent support Rectovaginal: guaiac negative stool obtained Lymphatic Exam: Non-palpable nodes in neck, clavicular, axillary, or inguinal regions  Skin: no rash or abnormalities Neurologic: Normal gait and speech, no tremor  Psychiatric: Alert and oriented, appropriate affect.  Urinalysis:Not done  Mallory Shirk. MD Pgr [phone] 9:19 AM    By signing my name below, I, Summer Carpenter, attest that this documentation has been prepared under the direction and in the presence of Jonnie Kind, MD. Electronically Signed: [name], Medical Scribe. 11/14/16. 9:19 AM.  I personally performed the services described in this documentation, which was SCRIBED in my presence. The recorded information has been reviewed and considered accurate. It has been edited as necessary during review. Jonnie Kind, MD

## 2016-11-15 LAB — CYTOLOGY - PAP
CHLAMYDIA, DNA PROBE: NEGATIVE
Diagnosis: NEGATIVE
HPV: NOT DETECTED
NEISSERIA GONORRHEA: NEGATIVE

## 2016-11-19 ENCOUNTER — Ambulatory Visit (HOSPITAL_COMMUNITY)
Admission: RE | Admit: 2016-11-19 | Discharge: 2016-11-19 | Disposition: A | Discharge status: Home / Self Care | Payer: PRIVATE HEALTH INSURANCE | Source: Ambulatory Visit | Attending: Obstetrics and Gynecology | Admitting: Obstetrics and Gynecology

## 2016-11-19 DIAGNOSIS — Z1231 Encounter for screening mammogram for malignant neoplasm of breast: Secondary | ICD-10-CM | POA: Diagnosis present

## 2016-12-21 ENCOUNTER — Other Ambulatory Visit: Payer: Self-pay | Admitting: Cardiovascular Disease

## 2016-12-23 ENCOUNTER — Encounter (HOSPITAL_COMMUNITY): Payer: Self-pay | Admitting: *Deleted

## 2016-12-23 ENCOUNTER — Emergency Department (HOSPITAL_COMMUNITY)
Admission: EM | Admit: 2016-12-23 | Discharge: 2016-12-23 | Disposition: A | Discharge status: Home / Self Care | Payer: PRIVATE HEALTH INSURANCE | Attending: Emergency Medicine | Admitting: Emergency Medicine

## 2016-12-23 DIAGNOSIS — M791 Myalgia: Secondary | ICD-10-CM | POA: Diagnosis not present

## 2016-12-23 DIAGNOSIS — I10 Essential (primary) hypertension: Secondary | ICD-10-CM | POA: Diagnosis not present

## 2016-12-23 DIAGNOSIS — Z79899 Other long term (current) drug therapy: Secondary | ICD-10-CM | POA: Diagnosis not present

## 2016-12-23 DIAGNOSIS — R1084 Generalized abdominal pain: Secondary | ICD-10-CM | POA: Diagnosis present

## 2016-12-23 DIAGNOSIS — M7918 Myalgia, other site: Secondary | ICD-10-CM

## 2016-12-23 DIAGNOSIS — Z7982 Long term (current) use of aspirin: Secondary | ICD-10-CM | POA: Diagnosis not present

## 2016-12-23 LAB — URINALYSIS, ROUTINE W REFLEX MICROSCOPIC
BACTERIA UA: NONE SEEN
Bilirubin Urine: NEGATIVE
GLUCOSE, UA: NEGATIVE mg/dL
Hgb urine dipstick: NEGATIVE
KETONES UR: NEGATIVE mg/dL
Leukocytes, UA: NEGATIVE
Nitrite: NEGATIVE
PROTEIN: 30 mg/dL — AB
Specific Gravity, Urine: 1.034 — ABNORMAL HIGH (ref 1.005–1.030)
pH: 5 (ref 5.0–8.0)

## 2016-12-23 MED ORDER — ONDANSETRON HCL 4 MG PO TABS
4.0000 mg | ORAL_TABLET | Freq: Once | ORAL | Status: AC
  Administered 2016-12-23: 4 mg via ORAL
  Filled 2016-12-23: qty 1

## 2016-12-23 MED ORDER — DICLOFENAC SODIUM 75 MG PO TBEC: 75.0000 mg | DELAYED_RELEASE_TABLET | Freq: Two times a day (BID) | ORAL | 0 refills | Status: DC

## 2016-12-23 MED ORDER — CYCLOBENZAPRINE HCL 10 MG PO TABS: 10.0000 mg | ORAL_TABLET | Freq: Three times a day (TID) | ORAL | 0 refills | Status: DC

## 2016-12-23 MED ORDER — CYCLOBENZAPRINE HCL 10 MG PO TABS
10.0000 mg | ORAL_TABLET | Freq: Once | ORAL | Status: AC
  Administered 2016-12-23: 10 mg via ORAL
  Filled 2016-12-23: qty 1

## 2016-12-23 MED ORDER — KETOROLAC TROMETHAMINE 10 MG PO TABS
10.0000 mg | ORAL_TABLET | Freq: Once | ORAL | Status: AC
  Administered 2016-12-23: 10 mg via ORAL
  Filled 2016-12-23: qty 1

## 2016-12-23 NOTE — Discharge Instructions (Signed)
Your vital signs within normal limits. Your urine test is negative for acute problem. I suspect the problem is related to musculoskeletal origin. Please use diclofenac 2 times daily. Use Flexeril 3 times daily for spasm pain. Please see her physicians at the [facility] for additional evaluation if not improving.

## 2016-12-23 NOTE — ED Provider Notes (Signed)
[facility] DEPT Provider Note   CSN: [id number] Arrival date & time: 12/23/16  2152     History   Chief Complaint Chief Complaint  Patient presents with  . Abdominal Pain    HPI Summer Carpenter is a 51 y.o. female.  Patient is a 51 year old female whe emergency departmenmplaint of right side pain. The patient states that on yesterday she began to have pain in her side that radiates into her abdomen. She's not had any fever chills, no nausea or vomiting reported. No diarrhea or constipation reported. She's not had any recent operations or procedures. She's not noticed any blood in her urine, or blood in her stool She's not been out of the country, or had any ill prepared food. He patient does acknowledge that she's been doing a lot of bending and stooping and moving some furniture recently.   The history is provided by the patient.    Past Medical History:  Diagnosis Date  . Anxiety   . GERD (gastroesophageal reflux disease)   . History of echocardiogram    a. 12/2012: EF 55-60%, no WMA, trivial TR  . Hyperlipidemia   . Hypertension   . Obesity   . Palpitations   . Prediabetes   . Sleep apnea     Patient Active Problem List   Diagnosis Date Noted  . Normal coronary arteries 10/09/2016  . Chest pain at rest 10/09/2016  . Well woman exam with routine gynecological exam 11/14/2015  . Encounter for other contraceptive management 02/04/2014  . Hypokalemia 08/28/2013  . Anemia 08/28/2013  . mild OSA (obstructive sleep apnea) 12/22/2012  . Fatigue 10/08/2012  . Snoring 10/08/2012  . Palpitations 09/14/2012  . Essential hypertension 09/14/2012  . Hyperlipidemia 09/14/2012  . Obesity (BMI 35.0-39.9 without comorbidity) 09/14/2012    Past Surgical History:  Procedure Laterality Date  . CARDIAC CATHETERIZATION  11/25/2001   norm. LV fx,norm. abd aorta, both renal arteries patent  . CERVICAL POLYPECTOMY N/A 12/15/2014   Procedure: ENDOMETRIAL POLYPECTOMY;   Surgeon: Florian Buff, MD;  Location: [facility];  Service: Gynecology;  Laterality: N/A;  . CHOLECYSTECTOMY    . DILITATION & CURRETTAGE/HYSTROSCOPY WITH NOVASURE ABLATION N/A 12/15/2014   Procedure: DILATATION & CURETTAGE/HYSTEROSCOPY WITH ENDOMETRIAL NOVASURE ABLATION;  Surgeon: Florian Buff, MD;  Location: [facility];  Service: Gynecology;  Laterality: N/A;  . DOPPLER ECHOCARDIOGRAPHY  07/30/2000 [place]   EF 55-66%,lv normal  . NM MYOCAR PERF WALL MOTION  10/22/2011  . OOPHORECTOMY  2008   lt    OB History    Gravida Para Term Preterm AB Living   6 3 3   3 3    SAB TAB Ectopic Multiple Live Births   3       3       Home Medications    Prior to Admission medications   Medication Sig Start Date End Date Taking? Authorizing Provider  acetaminophen (TYLENOL) 500 MG tablet Take 1,000 mg by mouth every 6 (six) hours as needed for mild pain or moderate pain.     [provider]  albuterol (PROVENTIL HFA;VENTOLIN HFA) 108 (90 Base) MCG/ACT inhaler Inhale 1-2 puffs into the lungs every 6 (six) hours as needed for wheezing or shortness of breath. 05/15/15   Fransico Meadow, PA-C  aspirin EC 81 MG tablet Take 81 mg by mouth daily.      [provider]  KLOR-CON M10 10 MEQ tablet TAKE ONE TABLET BY MOUTH DAILY 12/21/16   Croitoru, Dani Gobble, MD  losartan-hydrochlorothiazide (HYZAAR) 50-12.5 MG tablet Take 1 tablet by mouth daily. 10/02/15   [provider]  pantoprazole (PROTONIX) 40 MG tablet TAKE 1 TABLET (40 MG TOTAL) BY MOUTH DAILY. 06/20/16   Florian Buff, MD  propranolol ER (INDERAL LA) 160 MG SR capsule TAKE 1 CAPSULE (160 MG TOTAL) BY MOUTH 2 (TWO) TIMES DAILY. 06/14/16   Croitoru, Mihai, MD  sertraline (ZOLOFT) 50 MG tablet Take 50 mg by mouth every evening.     [provider]  simvastatin (ZOCOR) 10 MG tablet Take 10 mg by mouth at bedtime.     [provider]  sucralfate (CARAFATE) 1 G tablet Take 1 tablet by mouth 3 (three) times daily before  meals. 01/08/15   [provider]  valACYclovir (VALTREX) 500 MG tablet Take 1 tablet by mouth as needed. 07/09/15   [provider]    Family History Family History  Problem Relation Age of Onset  . Hypertension Mother   . Heart attack Mother   . Seizures Father   . Hypertension Brother   . Hypertension Brother   . Hypertension Sister   . Early death Brother   . Hypertension Son     Social History Social History  Substance Use Topics  . Smoking status: Never Smoker  . Smokeless tobacco: Never Used  . Alcohol use Yes     Comment: Occasionally, once a month     Allergies   Septra [bactrim] and Sulfamethoxazole-trimethoprim   Review of Systems Review of Systems  Constitutional: Negative for activity change.       All ROS Neg except as noted in HPI  HENT: Negative for nosebleeds.   Eyes: Negative for photophobia and discharge.  Respiratory: Negative for cough, shortness of breath and wheezing.   Cardiovascular: Negative for chest pain and palpitations.  Gastrointestinal: Negative for abdominal pain, blood in stool, constipation, diarrhea and nausea.  Genitourinary: Negative for dysuria, frequency and hematuria.  Musculoskeletal: Positive for myalgias. Negative for arthralgias, back pain and neck pain.  Skin: Negative.   Neurological: Negative for dizziness, seizures and speech difficulty.  Psychiatric/Behavioral: Negative for confusion and hallucinations.     Physical Exam Updated Vital Signs BP (!) 160/83 (BP Location: Right Arm)   Pulse 74   Temp 98.1 F (36.7 C) (Oral)   Resp 19   Ht 5' 7.5" (1.715 m)   Wt 109.3 kg (241 lb)   LMP 12/12/2016   SpO2 100%   BMI 37.19 kg/m   Physical Exam  Constitutional: She is oriented to person, place, and time. She appears well-developed and well-nourished.  Non-toxic appearance.  HENT:  Head: Normocephalic.  Right Ear: Tympanic membrane and external ear normal.  Left Ear: Tympanic membrane and  external ear normal.  Eyes: Pupils are equal, round, and reactive to light. EOM and lids are normal.  Neck: Normal range of motion. Neck supple. Carotid bruit is not present.  Cardiovascular: Normal rate, regular rhythm, normal heart sounds, intact distal pulses and normal pulses.   Pulmonary/Chest: Breath sounds normal. No respiratory distress.  Abdominal: Soft. Bowel sounds are normal. She exhibits no distension. There is no tenderness. There is no guarding.  There is pain to palpation and range of motion of the right flank area eg into the right abdomen. No pain at the McBurney's area. No CVA tenderness.  Musculoskeletal: Normal range of motion.  Pain of the flank and lower abdomen and back is reproduced by range of motion exercises  Lymphadenopathy:  Head (right side): No submandibular adenopathy present.       Head (left side): No submandibular adenopathy present.    She has no cervical adenopathy.  Neurological: She is alert and oriented to person, place, and time. She has normal strength. No cranial nerve deficit or sensory deficit.  Skin: Skin is warm and dry.  Psychiatric: She has a normal mood and affect. Her speech is normal.  Nursing note and vitals reviewed.    ED Treatments / Results  Labs (all labs ordered are listed, but only abnormal results are displayed) Labs Reviewed - No data to display  EKG  EKG Interpretation None       Radiology No results found.  Procedures Procedures (including critical care time)  Medications Ordered in ED Medications - No data to display   Initial Impression / Assessment and Plan / ED Course  I have reviewed the triage vital signs and the nursing notes.  Pertinent labs & imaging results that were available during my care of the patient were reviewed by me and considered in my medical decision making (see chart for details).       Final Clinical Impressions(s) / ED Diagnoses MDM Vital signs within normal limits.  Urinalysis is negative for kidney stone or urinary tract infection. I suspect that this pain is musculoskeletal in origin.ient will be treated for musculoskeletal pain. She will follow-up with the [facility] or return to the emergency department if any changes or problems. Patient is in agreement with this plan.   Final diagnoses:  Musculoskeletal pain    New Prescriptions Discharge Medication List as of 12/23/2016 11:19 PM    START taking these medications   Details  cyclobenzaprine (FLEXERIL) 10 MG tablet Take 1 tablet (10 mg total) by mouth 3 (three) times daily., Starting Sun 12/23/2016, Print         Lily Kocher, PA-C 12/25/16 1444    Milton Ferguson, MD 12/26/16 1555

## 2016-12-23 NOTE — ED Triage Notes (Signed)
Pt c/o right side abd pain that started yesterday, denies any n/v/d, fever,

## 2016-12-24 ENCOUNTER — Telehealth: Payer: Self-pay | Admitting: Cardiovascular Disease

## 2016-12-24 NOTE — Telephone Encounter (Signed)
Returned call to patient.She stated she pulled a muscle in her right mid side.She went to [facility] ED last night.B/P 160/85.This morning B/P 150/89.Stated she continues to be in pain.She is concerned B/P elevated.She is taking all medication as prescribed,has not missed any doses.Advised pain will cause B/P elevation.I will send message to Dr.Croitoru.

## 2016-12-24 NOTE — Telephone Encounter (Signed)
Returned call to patient Dr.Croitoru's advice given.Advised to call back if B/P remains elevated.

## 2016-12-24 NOTE — Telephone Encounter (Signed)
Pt said he blood pressure have been running high for the last 2 days. She wants to know if she should increase her medicine or just keep an eye on it. It was 160/89 last night and today it was 150/85.

## 2016-12-24 NOTE — Telephone Encounter (Signed)
I agree, it is normal for her blood pressure to be high when she is in any type of discomfort. That degree of blood pressure elevation is not unexpected and is not dangerous. Blood pressure should improve as her discomfort improves.

## 2017-03-07 ENCOUNTER — Ambulatory Visit: Payer: PRIVATE HEALTH INSURANCE | Admitting: Women's Health

## 2017-03-07 ENCOUNTER — Encounter: Payer: Self-pay | Admitting: Women's Health

## 2017-03-07 VITALS — BP 110/80 | HR 93 | Ht 67.2 in | Wt 241.0 lb

## 2017-03-07 DIAGNOSIS — R232 Flushing: Secondary | ICD-10-CM

## 2017-03-07 DIAGNOSIS — L739 Follicular disorder, unspecified: Secondary | ICD-10-CM

## 2017-03-07 NOTE — Progress Notes (Signed)
   GYN VISIT Patient name: Summer Carpenter MRN [id number]  Date of birth: 08/16/1965 Chief Complaint:   gyn visit (bump pubic area/ hot flashes)  History of Present Illness:   Summer Carpenter is a 51 y.o. [phone] African American female being seen today for report of bumps in vaginal area for about 6 weeks, went to [facility], they told her it was folliculitis and gave her rx for keflex QID x 10d, she has a few more days left on it, and says bumps are much better- most have gone away- only 2 left. Do not hurt/itch/burn or come to head- they are more deep under skin. Doesn't shave. Did change detergent just prior to them developing.  Also reports hot flashes at night, getting to point they are 'getting to me'. States she began 'perimenopause in 57s', still has regular periods. Denies vaginal dryness, mood swings, etc. Mom was 60yo when she stopped periods. Doesn't sleep well anyway, hot flashes haven't made sleeping worse. Hasn't tried anything. Was wanting to try something otc. Not interested in HRT or brisdelle at this time.      Patient's last menstrual period was 02/17/2017. The current method of family planning is ablation. Last pap 11/14/16. Results were:  normal Review of Systems:   Pertinent items are noted in HPI Denies fever/chills, dizziness, headaches, visual disturbances, fatigue, shortness of breath, chest pain, abdominal pain, vomiting, abnormal vaginal discharge/itching/odor/irritation, problems with periods, bowel movements, urination, or intercourse unless otherwise stated above.  Pertinent History Reviewed:  Reviewed past medical,surgical, social, obstetrical and family history.  Reviewed problem list, medications and allergies. Physical Assessment:   Vitals:   03/07/17 0958  BP: 110/80  Pulse: 93  Weight: 241 lb (109.3 kg)  Height: 5' 7.2" (1.707 m)  Body mass index is 37.52 kg/m.       Physical Examination:   General appearance: alert, well appearing, and in  no distress  Mental status: alert, oriented to person, place, and time  Skin: warm & dry   Cardiovascular: normal heart rate noted  Respiratory: normal respiratory effort, no distress  Abdomen: soft, non-tender   Pelvic: VULVA: one deep non-tender cyst Rt labia, unable to adequately feel other on Lt labia she points to  Extremities: no edema   No results found for this or any previous visit (from the past 24 hour(s)).  Assessment & Plan:  1) Resolving folliculitis> continue keflex qid x total of 10d, switch back to old detergent, let us know if bumps don't completely resolve/or if they return  2) Hot flashes> discussed w/ JAG, can try otc evening primrose oil q hs, pycnogenol 100mg  BID, soy products, yams. Avoid black cohash. Let us know if changes mind and wants HRT or brisdelle.   No orders of the defined types were placed in this encounter.   Return for Aug for physical.  Tawnya Crook CNM, [facility] 03/07/2017 10:40 AM

## 2017-03-07 NOTE — Patient Instructions (Addendum)
Pycnogenol 100mg  twice daily (online)  Evening Primrose Oil (follow directions on bottle)  Soy products  Yams      Finish antibiotics, switch detergent, let us know if bumps return

## 2017-03-15 ENCOUNTER — Other Ambulatory Visit: Payer: Self-pay

## 2017-03-15 ENCOUNTER — Telehealth: Payer: Self-pay

## 2017-03-15 MED ORDER — PROPRANOLOL HCL ER 160 MG PO CP24: 160.0000 mg | ORAL_CAPSULE | Freq: Two times a day (BID) | ORAL | 3 refills | Status: DC

## 2017-03-15 MED ORDER — POTASSIUM CHLORIDE CRYS ER 10 MEQ PO TBCR: 10.0000 meq | EXTENDED_RELEASE_TABLET | Freq: Every day | ORAL | 1 refills | Status: DC

## 2017-03-15 NOTE — Telephone Encounter (Signed)
error 

## 2017-06-11 ENCOUNTER — Other Ambulatory Visit: Payer: Self-pay | Admitting: Obstetrics & Gynecology

## 2017-06-20 ENCOUNTER — Other Ambulatory Visit: Payer: Self-pay | Admitting: Cardiovascular Disease

## 2017-06-27 ENCOUNTER — Emergency Department (HOSPITAL_COMMUNITY): Payer: BLUE CROSS/BLUE SHIELD

## 2017-06-27 ENCOUNTER — Other Ambulatory Visit: Payer: Self-pay

## 2017-06-27 ENCOUNTER — Encounter (HOSPITAL_COMMUNITY): Payer: Self-pay

## 2017-06-27 ENCOUNTER — Emergency Department (HOSPITAL_COMMUNITY)
Admission: EM | Admit: 2017-06-27 | Discharge: 2017-06-28 | Disposition: A | Discharge status: Home / Self Care | Payer: BLUE CROSS/BLUE SHIELD | Attending: Emergency Medicine | Admitting: Emergency Medicine

## 2017-06-27 DIAGNOSIS — Z79899 Other long term (current) drug therapy: Secondary | ICD-10-CM | POA: Diagnosis not present

## 2017-06-27 DIAGNOSIS — I10 Essential (primary) hypertension: Secondary | ICD-10-CM | POA: Insufficient documentation

## 2017-06-27 DIAGNOSIS — R0789 Other chest pain: Secondary | ICD-10-CM

## 2017-06-27 DIAGNOSIS — Z7982 Long term (current) use of aspirin: Secondary | ICD-10-CM | POA: Diagnosis not present

## 2017-06-27 DIAGNOSIS — R079 Chest pain, unspecified: Secondary | ICD-10-CM | POA: Diagnosis present

## 2017-06-27 LAB — CBC WITH DIFFERENTIAL/PLATELET
BASOS PCT: 0 %
Basophils Absolute: 0 10*3/uL (ref 0.0–0.1)
EOS ABS: 0.3 10*3/uL (ref 0.0–0.7)
EOS PCT: 2 %
HCT: 36.3 % (ref 36.0–46.0)
Hemoglobin: 11.2 g/dL — ABNORMAL LOW (ref 12.0–15.0)
Lymphocytes Relative: 27 %
Lymphs Abs: 2.9 10*3/uL (ref 0.7–4.0)
MCH: 26.4 pg (ref 26.0–34.0)
MCHC: 30.9 g/dL (ref 30.0–36.0)
MCV: 85.4 fL (ref 78.0–100.0)
MONO ABS: 1 10*3/uL (ref 0.1–1.0)
Monocytes Relative: 9 %
Neutro Abs: 6.6 10*3/uL (ref 1.7–7.7)
Neutrophils Relative %: 62 %
PLATELETS: 319 10*3/uL (ref 150–400)
RBC: 4.25 MIL/uL (ref 3.87–5.11)
RDW: 16.2 % — AB (ref 11.5–15.5)
WBC: 10.7 10*3/uL — ABNORMAL HIGH (ref 4.0–10.5)

## 2017-06-27 LAB — BASIC METABOLIC PANEL
Anion gap: 12 (ref 5–15)
BUN: 16 mg/dL (ref 6–20)
CALCIUM: 9.4 mg/dL (ref 8.9–10.3)
CO2: 25 mmol/L (ref 22–32)
CREATININE: 1.22 mg/dL — AB (ref 0.44–1.00)
Chloride: 105 mmol/L (ref 101–111)
GFR calc Af Amer: 58 mL/min — ABNORMAL LOW (ref >60)
GFR, EST NON AFRICAN AMERICAN: 50 mL/min — AB (ref >60)
GLUCOSE: 91 mg/dL (ref 65–99)
Potassium: 3.5 mmol/L (ref 3.5–5.1)
Sodium: 142 mmol/L (ref 135–145)

## 2017-06-27 LAB — TROPONIN I: Troponin I: <0.03 ng/mL (ref <0.03)

## 2017-06-27 MED ORDER — ASPIRIN 81 MG PO CHEW
324.0000 mg | CHEWABLE_TABLET | Freq: Once | ORAL | Status: AC
  Administered 2017-06-27: 324 mg via ORAL
  Filled 2017-06-27: qty 4

## 2017-06-27 NOTE — ED Notes (Signed)
Patient transported to X-ray 

## 2017-06-27 NOTE — ED Notes (Signed)
Pt returned from [facility].

## 2017-06-27 NOTE — ED Triage Notes (Signed)
Pt was at work and developed a dull chest pain. Pt has had intermittent chest pain on and off for the past few days.

## 2017-06-27 NOTE — ED Provider Notes (Signed)
[facility] EMERGENCY DEPARTMENT Provider Note   CSN: [id number] Arrival date & time: 06/27/17  1916     History   Chief Complaint Chief Complaint  Patient presents with  . Chest Pain    HPI Summer Carpenter is a 52 y.o. female.  HPI  52 year old female with a history of hypertension and hyperlipidemia presents with chest pain.  She states she has had this chest pain and been seen for multiple prior times.  It is been ongoing for the past 3 days but much more prevalent today.  She states that it comes and goes and lasts only a few seconds at a time.  It feels like a dull sensation in her mid chest.  Does not radiate and there is no associated back, neck, or abdominal pain.  There is no associated shortness of breath, nausea, or vomiting.  She states there is no diaphoresis but occasionally she will have a very warm/hot feeling throughout her body.  Nothing seems to elicit the chest pain.  She had some leftover Flexeril and has been told that her chest pain was muscular in etiology in the past and so she tried taking this but it has not helped.  Currently has no chest pain.  Occasionally at the end of the day she will have bilateral feet swelling but has not had any unilateral swelling and does not currently have leg swelling.  Past Medical History:  Diagnosis Date  . Anxiety   . GERD (gastroesophageal reflux disease)   . History of echocardiogram    a. 12/2012: EF 55-60%, no WMA, trivial TR  . Hyperlipidemia   . Hypertension   . Obesity   . Palpitations   . Prediabetes   . Sleep apnea     Patient Active Problem List   Diagnosis Date Noted  . Normal coronary arteries 10/09/2016  . Chest pain at rest 10/09/2016  . Hypokalemia 08/28/2013  . Anemia 08/28/2013  . mild OSA (obstructive sleep apnea) 12/22/2012  . Fatigue 10/08/2012  . Snoring 10/08/2012  . Palpitations 09/14/2012  . Essential hypertension 09/14/2012  . Hyperlipidemia 09/14/2012  . Obesity (BMI 35.0-39.9  without comorbidity) 09/14/2012    Past Surgical History:  Procedure Laterality Date  . CARDIAC SURGERY     catheterization in 2002  . CERVICAL POLYPECTOMY N/A 12/15/2014   Procedure: ENDOMETRIAL POLYPECTOMY;  Surgeon: Florian Buff, MD;  Location: [facility];  Service: Gynecology;  Laterality: N/A;  . CHOLECYSTECTOMY    . DILITATION & CURRETTAGE/HYSTROSCOPY WITH NOVASURE ABLATION N/A 12/15/2014   Procedure: DILATATION & CURETTAGE/HYSTEROSCOPY WITH ENDOMETRIAL NOVASURE ABLATION;  Surgeon: Florian Buff, MD;  Location: [facility];  Service: Gynecology;  Laterality: N/A;  . DOPPLER ECHOCARDIOGRAPHY  07/30/2000 [place]   EF 55-66%,lv normal  . NM MYOCAR PERF WALL MOTION  10/22/2011  . OOPHORECTOMY  2008   lt    OB History    Gravida  6   Para  3   Term  3   Preterm      AB  3   Living  3     SAB  3   TAB      Ectopic      Multiple      Live Births  3            Home Medications    Prior to Admission medications   Medication Sig Start Date End Date Taking? Authorizing Provider  acetaminophen (TYLENOL) 500 MG tablet Take 1,000 mg by mouth every  6 (six) hours as needed for mild pain or moderate pain.    Yes [provider]  aspirin EC 81 MG tablet Take 81 mg by mouth daily.     Yes [provider]  cyclobenzaprine (FLEXERIL) 10 MG tablet Take 1 tablet (10 mg total) by mouth 3 (three) times daily. 12/23/16  Yes Lily Kocher, PA-C  losartan-hydrochlorothiazide (HYZAAR) 50-12.5 MG tablet Take 1 tablet by mouth daily. 10/02/15  Yes [provider]  pantoprazole (PROTONIX) 40 MG tablet TAKE 1 TABLET (40 MG TOTAL) BY MOUTH DAILY. 06/11/17  Yes Florian Buff, MD  potassium chloride (KLOR-CON M10) 10 MEQ tablet Take 1 tablet (10 mEq total) by mouth daily. 03/15/17  Yes Croitoru, Mihai, MD  propranolol ER (INDERAL LA) 160 MG SR capsule Take 1 capsule (160 mg total) by mouth 2 (two) times daily. 03/15/17  Yes Croitoru, Mihai, MD  sertraline (ZOLOFT) 50 MG  tablet Take 50 mg by mouth every evening.    Yes [provider]  simvastatin (ZOCOR) 10 MG tablet Take 10 mg by mouth at bedtime.    Yes [provider]  sucralfate (CARAFATE) 1 G tablet Take 1 tablet by mouth 3 (three) times daily before meals. 01/08/15  Yes [provider]  albuterol (PROVENTIL HFA;VENTOLIN HFA) 108 (90 Base) MCG/ACT inhaler Inhale 1-2 puffs into the lungs every 6 (six) hours as needed for wheezing or shortness of breath. Patient not taking: Reported on 03/07/2017 05/15/15   Fransico Meadow, PA-C  cephALEXin [facility]) 250 MG capsule Take by mouth 4 (four) times daily.    [provider]  diclofenac (VOLTAREN) 75 MG EC tablet Take 1 tablet (75 mg total) by mouth 2 (two) times daily. Patient not taking: Reported on 03/07/2017 12/23/16   Lily Kocher, PA-C  valACYclovir (VALTREX) 500 MG tablet Take 1 tablet by mouth as needed. 07/09/15   [provider]    Family History Family History  Problem Relation Age of Onset  . Hypertension Mother   . Heart attack Mother   . Seizures Father   . Hypertension Brother   . Hypertension Brother   . Hypertension Sister   . Early death Brother   . Hypertension Son     Social History Social History   Tobacco Use  . Smoking status: Never Smoker  . Smokeless tobacco: Never Used  Substance Use Topics  . Alcohol use: Yes    Comment: Occasionally, once a month  . Drug use: No     Allergies   Septra [bactrim] and Sulfamethoxazole-trimethoprim   Review of Systems Review of Systems  Constitutional: Negative for diaphoresis.  Respiratory: Negative for shortness of breath.   Cardiovascular: Positive for chest pain. Negative for leg swelling.  Gastrointestinal: Negative for abdominal pain, nausea and vomiting.  Musculoskeletal: Negative for back pain.  All other systems reviewed and are negative.    Physical Exam Updated Vital Signs BP 133/67   Pulse 72   Temp 98.3 F (36.8 C)  (Oral)   Resp 15   Ht 5\' 7"  (1.702 m)   Wt 108.9 kg (240 lb)   LMP 06/17/2017 (Exact Date)   SpO2 99%   BMI 37.59 kg/m   Physical Exam  Constitutional: She is oriented to person, place, and time. She appears well-developed and well-nourished.  Non-toxic appearance. She does not appear ill. No distress.  HENT:  Head: Normocephalic and atraumatic.  Right Ear: External ear normal.  Left Ear: External ear normal.  Nose: Nose normal.  Eyes: Right eye exhibits no discharge. Left eye exhibits no discharge.  Cardiovascular: Normal rate, regular rhythm and normal heart sounds.  Pulses:      Radial pulses are 2+ on the right side, and 2+ on the left side.  Pulmonary/Chest: Effort normal and breath sounds normal.  Abdominal: Soft. There is no tenderness.  Musculoskeletal:       Right lower leg: She exhibits no edema.       Left lower leg: She exhibits no edema.  Neurological: She is alert and oriented to person, place, and time.  Skin: Skin is warm and dry.  Nursing note and vitals reviewed.    ED Treatments / Results  Labs (all labs ordered are listed, but only abnormal results are displayed) Labs Reviewed  BASIC METABOLIC PANEL - Abnormal; Notable for the following components:      Result Value   Creatinine, Ser 1.22 (*)    GFR calc non Af Amer 50 (*)    GFR calc Af Amer 58 (*)    All other components within normal limits  CBC WITH DIFFERENTIAL/PLATELET - Abnormal; Notable for the following components:   WBC 10.7 (*)    Hemoglobin 11.2 (*)    RDW 16.2 (*)    All other components within normal limits  TROPONIN I  TROPONIN I    EKG  EKG Interpretation  Date/Time:  Thursday June 27 2017 19:31:46 EDT Ventricular Rate:  83 PR Interval:    QRS Duration: 105 QT Interval:  379 QTC Calculation: 446 R Axis:   43 Text Interpretation:  Normal sinus rhythm Abnormal R-wave progression, early transition no significant change since July 2018 Confirmed by Sherwood Gambler [phone]) on  06/27/2017 8:00:22 PM       EKG Interpretation  Date/Time:  Thursday June 27 2017 21:55:54 EDT Ventricular Rate:  71 PR Interval:    QRS Duration: 105 QT Interval:  404 QTC Calculation: 439 R Axis:   26 Text Interpretation:  Normal sinus rhythm Abnormal R-wave progression, early transition no significant change since earlier in the day Confirmed by Sherwood Gambler [phone]) on 06/27/2017 11:13:23 PM       Radiology Dg Chest 2 View  Result Date: 06/27/2017 CLINICAL DATA:  Chest pain, onset today. EXAM: CHEST - 2 VIEW COMPARISON:  Radiograph 10/11/2016 FINDINGS: The cardiomediastinal contours are normal. The lungs are clear. Pulmonary vasculature is normal. No consolidation, pleural effusion, or pneumothorax. No acute osseous abnormalities are seen. IMPRESSION: No acute pulmonary process. Electronically Signed   By: Jeb Levering M.D.   On: 06/27/2017 21:33    Procedures Procedures (including critical care time)  Medications Ordered in ED Medications  aspirin chewable tablet 324 mg (324 mg Oral Given 06/27/17 2036)     Initial Impression / Assessment and Plan / ED Course  I have reviewed the triage vital signs and the nursing notes.  Pertinent labs & imaging results that were available during my care of the patient were reviewed by me and considered in my medical decision making (see chart for details).     The patient's workup is reassuring.  My suspicion for PE is quite low and I think ACS is also less likely.  However she does have some risk factors with hypertension and hyperlipidemia.  Thus ECG and troponins obtained.  Initial troponin negative and ECG is unchanged from baseline.  We discussed a second troponin given the on and off symptoms.  Unfortunately this was drawn 1 hour too early.  While it is negative,  I did suggest we could do a second 1 but the patient is declining.  Given the low suspicion for ACS and the on and off symptoms for a couple days I think this is  pretty reasonable.  She will follow-up with her cardiologist.  Discussed return precautions.  Final Clinical Impressions(s) / ED Diagnoses   Final diagnoses:  Atypical chest pain    ED Discharge Orders    None       Sherwood Gambler, MD 06/28/17 [phone]

## 2017-06-28 ENCOUNTER — Other Ambulatory Visit: Payer: Self-pay

## 2017-06-28 MED ORDER — POTASSIUM CHLORIDE CRYS ER 10 MEQ PO TBCR: 10.0000 meq | EXTENDED_RELEASE_TABLET | Freq: Every day | ORAL | 1 refills | Status: DC

## 2017-07-29 ENCOUNTER — Telehealth: Payer: Self-pay | Admitting: Obstetrics and Gynecology

## 2017-07-29 NOTE — Telephone Encounter (Signed)
Patient called stating that she called her pharmacy last week to get a refill of her Valtrex but she has not received a call stating that it is ready for pick up. Please contact pt

## 2017-07-30 ENCOUNTER — Other Ambulatory Visit: Payer: Self-pay | Admitting: Advanced Practice Midwife

## 2017-07-30 MED ORDER — VALACYCLOVIR HCL 500 MG PO TABS: 500.0000 mg | ORAL_TABLET | Freq: Every day | ORAL | 11 refills | Status: DC

## 2017-07-30 NOTE — Progress Notes (Signed)
Daily valtrex refilled

## 2017-10-07 ENCOUNTER — Other Ambulatory Visit: Payer: Self-pay | Admitting: Obstetrics and Gynecology

## 2017-10-07 DIAGNOSIS — Z1231 Encounter for screening mammogram for malignant neoplasm of breast: Secondary | ICD-10-CM

## 2017-10-22 ENCOUNTER — Encounter: Payer: Self-pay | Admitting: Cardiovascular Disease

## 2017-10-22 ENCOUNTER — Ambulatory Visit: Payer: BLUE CROSS/BLUE SHIELD | Admitting: Cardiovascular Disease

## 2017-10-22 VITALS — BP 122/72 | HR 74 | Ht 67.0 in | Wt 240.2 lb

## 2017-10-22 DIAGNOSIS — E7800 Pure hypercholesterolemia, unspecified: Secondary | ICD-10-CM | POA: Diagnosis not present

## 2017-10-22 DIAGNOSIS — E876 Hypokalemia: Secondary | ICD-10-CM | POA: Diagnosis not present

## 2017-10-22 DIAGNOSIS — I10 Essential (primary) hypertension: Secondary | ICD-10-CM

## 2017-10-22 DIAGNOSIS — E668 Other obesity: Secondary | ICD-10-CM

## 2017-10-22 DIAGNOSIS — G4733 Obstructive sleep apnea (adult) (pediatric): Secondary | ICD-10-CM

## 2017-10-22 NOTE — Progress Notes (Signed)
[name], female   DOB: Nov 04, 1965, 52 y.o.   MRN: [id number]    Cardiology Office Note    Date:  10/22/2017   ID:  Summer Carpenter, DOB Jul 29, 1965, MRN [id number]  PCP:  [facility]  Cardiologist:   Sanda Klein, MD   Chief complaint: palpitations and HBP follow up   History of Present Illness:  Summer Carpenter is a 52 y.o. female with HTN and palpitations, returning for follow up.  She generally feels well.  She is walking on a regular basis and does not have complaints of dyspnea.  She occasionally has upper chest tightness when she spends her arms but this sounds musculoskeletal.  It does not occur when she is climbing steps or doing other types of physical activity.  She has not been troubled by palpitations.  She has not had dizziness or lightheadedness or syncope or edema.  She denies excessive daytime sleepiness or fatigue.  She reports labs performed with the family practice that were generally "okay.  Potassium was reportedly normal.  Lipid profile was range.  She reports that her kidney function looked a little worrisome and she was told to avoid taking all NSAIDs.  Diclofenac is still on her medication list, but she is not taking it.  Previous monitoring has not shown any evidence of serious arrhythmia. She has normal left ventricular size, wall thickness, systolic and diastolic function and no valvular abnormalities by echocardiography performed in September 2014. She is compliant with a statin for hyperlipidemia. Nebivolol was not as effective at symptom control.  Coronary disease by remote cardiac catheterization in 2003 and had no ischemic defects on the nuclear stress test in 2013.  Past Medical History:  Diagnosis Date  . Anxiety   . GERD (gastroesophageal reflux disease)   . History of echocardiogram    a. 12/2012: EF 55-60%, no WMA, trivial TR  . Hyperlipidemia   . Hypertension   . Obesity   . Palpitations    . Prediabetes   . Sleep apnea     Past Surgical History:  Procedure Laterality Date  . CARDIAC SURGERY     catheterization in 2002  . CERVICAL POLYPECTOMY N/A 12/15/2014   Procedure: ENDOMETRIAL POLYPECTOMY;  Surgeon: Florian Buff, MD;  Location: [facility];  Service: Gynecology;  Laterality: N/A;  . CHOLECYSTECTOMY    . DILITATION & CURRETTAGE/HYSTROSCOPY WITH NOVASURE ABLATION N/A 12/15/2014   Procedure: DILATATION & CURETTAGE/HYSTEROSCOPY WITH ENDOMETRIAL NOVASURE ABLATION;  Surgeon: Florian Buff, MD;  Location: [facility];  Service: Gynecology;  Laterality: N/A;  . DOPPLER ECHOCARDIOGRAPHY  07/30/2000 [place]   EF 55-66%,lv normal  . NM MYOCAR PERF WALL MOTION  10/22/2011  . OOPHORECTOMY  2008   lt    Current Outpatient Medications  Medication Sig Dispense Refill  . acetaminophen (TYLENOL) 500 MG tablet Take 1,000 mg by mouth every 6 (six) hours as needed for mild pain or moderate pain.     Marland Kitchen aspirin EC 81 MG tablet Take 81 mg by mouth daily.      Marland Kitchen losartan-hydrochlorothiazide (HYZAAR) 50-12.5 MG tablet Take 1 tablet by mouth daily.  6  . pantoprazole (PROTONIX) 40 MG tablet TAKE 1 TABLET (40 MG TOTAL) BY MOUTH DAILY. 90 tablet 3  . potassium chloride (KLOR-CON M10) 10 MEQ tablet Take 1 tablet (10 mEq total) by mouth daily. 90 tablet 1  . propranolol ER (INDERAL LA) 160 MG SR capsule Take 1 capsule (160 mg total) by  mouth 2 (two) times daily. 180 capsule 3  . sertraline (ZOLOFT) 50 MG tablet Take 50 mg by mouth every evening.     . simvastatin (ZOCOR) 10 MG tablet Take 10 mg by mouth at bedtime.     . sucralfate (CARAFATE) 1 G tablet Take 1 tablet by mouth 3 (three) times daily before meals.  0  . valACYclovir (VALTREX) 500 MG tablet Take 1 tablet (500 mg total) by mouth daily. 30 tablet 11   No current facility-administered medications for this visit.     Allergies:   Septra [bactrim] and Sulfamethoxazole-trimethoprim   Social History   Socioeconomic History  . Marital  status: Single    Spouse name: Not on file  . Number of children: Not on file  . Years of education: Not on file  . Highest education level: Not on file  Occupational History  . Not on file  Social Needs  . Financial resource strain: Not on file  . Food insecurity:    Worry: Not on file    Inability: Not on file  . Transportation needs:    Medical: Not on file    Non-medical: Not on file  Tobacco Use  . Smoking status: Never Smoker  . Smokeless tobacco: Never Used  Substance and Sexual Activity  . Alcohol use: Yes    Comment: Occasionally, once a month  . Drug use: No  . Sexual activity: Yes    Birth control/protection: None  Lifestyle  . Physical activity:    Days per week: Not on file    Minutes per session: Not on file  . Stress: Not on file  Relationships  . Social connections:    Talks on phone: Not on file    Gets together: Not on file    Attends religious service: Not on file    Active member of club or organization: Not on file    Attends meetings of clubs or organizations: Not on file    Relationship status: Not on file  Other Topics Concern  . Not on file  Social History Narrative  . Not on file     Family History:  The patient's family history includes Early death in her brother; Heart attack in her mother; Hypertension in her brother, brother, mother, sister, and son; Seizures in her father.   ROS:   Please see the history of present illness.    Review of Systems  All other systems reviewed and are negative.   PHYSICAL EXAM:   VS:  BP 122/72   Pulse 74   Ht 5\' 7"  (1.702 m)   Wt 240 lb 3.2 oz (109 kg)   BMI 37.62 kg/m    GEN: Well nourished, well developed, in no acute distress  HEENT: normal  Neck: no JVD, carotid bruits, or masses Cardiac: RRR; no murmurs, rubs, or gallops,no edema  Respiratory:  clear to auscultation bilaterally, normal work of breathing GI: soft, nontender, nondistended, + BS MS: no deformity or atrophy  Skin: warm and  dry, no rash Neuro:  Alert and Oriented x 3, Strength and sensation are intact Psych: euthymic mood, full affect  Wt Readings from Last 3 Encounters:  10/22/17 240 lb 3.2 oz (109 kg)  06/27/17 240 lb (108.9 kg)  03/07/17 241 lb (109.3 kg)      Studies/Labs Reviewed:   EKG:  EKG is ordered today.  Shows sinus rhythm with incomplete right bundle branch block otherwise normal.  QTC 448 ms.  8143 during his last  treatment, Eliquis can be made work-up.  He had cardioverted with back at which may be a good plan for spasm thank you blood work review was depression also present patient presents appetite is he  Recent Labs: 06/27/2017: BUN 16; Creatinine, Ser 1.22; Hemoglobin 11.2; Platelets 319; Potassium 3.5; Sodium 142    ASSESSMENT:    1. Essential hypertension   2. Hypokalemia   3. Hypercholesterolemia   4. Moderate obesity   5. OSA (obstructive sleep apnea)      PLAN:  In order of problems listed above:  1. HTN: Well controlled.  Was difficult to control without thiazide diuretics. 2. Hypokalemia: Often precipitates palpitations.  Reminded her to eat potassium rich foods. 3. HLP: On statin.  Request labs from PCP 4. Obesity: Discussed healthy lifestyle changes. 5. OSA: Mild, not meeting criteria for CPAP.  Does not have daytime hypersomnolence.   Medication Adjustments/Labs and Tests Ordered: Current medicines are reviewed at length with the patient today.  Concerns regarding medicines are outlined above.  Medication changes, Labs and Tests ordered today are listed below. Patient Instructions  Dr Sallyanne Kuster recommends that you schedule a follow-up appointment in 12 months. You will receive a reminder letter in the mail two months in advance. If you don't receive a letter, please call our office to schedule the follow-up appointment.  If you need a refill on your cardiac medications before your next appointment, please call your pharmacy.     Signed, Sanda Klein, MD    10/22/2017 8:47 AM    [facility] [address], [place], [place]  [address] Phone: [phone]; Fax: [phone]

## 2017-10-22 NOTE — Patient Instructions (Signed)
Dr Croitoru recommends that you schedule a follow-up appointment in 12 months. You will receive a reminder letter in the mail two months in advance. If you don't receive a letter, please call our office to schedule the follow-up appointment.  If you need a refill on your cardiac medications before your next appointment, please call your pharmacy. 

## 2017-11-05 ENCOUNTER — Other Ambulatory Visit: Payer: Self-pay | Admitting: Cardiovascular Disease

## 2017-11-05 NOTE — Telephone Encounter (Signed)
Rx request sent to pharmacy.  

## 2017-11-22 ENCOUNTER — Ambulatory Visit (HOSPITAL_COMMUNITY): Payer: BLUE CROSS/BLUE SHIELD

## 2017-11-27 ENCOUNTER — Ambulatory Visit (HOSPITAL_COMMUNITY)
Admission: RE | Admit: 2017-11-27 | Discharge: 2017-11-27 | Disposition: A | Discharge status: Home / Self Care | Payer: BLUE CROSS/BLUE SHIELD | Source: Ambulatory Visit | Attending: Obstetrics and Gynecology | Admitting: Obstetrics and Gynecology

## 2017-11-27 DIAGNOSIS — Z1231 Encounter for screening mammogram for malignant neoplasm of breast: Secondary | ICD-10-CM

## 2017-12-23 ENCOUNTER — Other Ambulatory Visit: Payer: PRIVATE HEALTH INSURANCE | Admitting: Obstetrics and Gynecology

## 2018-01-08 ENCOUNTER — Telehealth: Payer: Self-pay | Admitting: Cardiovascular Disease

## 2018-01-08 DIAGNOSIS — R002 Palpitations: Secondary | ICD-10-CM

## 2018-01-08 NOTE — Telephone Encounter (Signed)
Pt c/o medication issue:  1. Name of Medication: pt did not state one particular medication.  2. How are you currently taking this medication (dosage and times per day)? Pt stated she thinks all of her medications need adjusting.    3. Are you having a reaction (difficulty breathing--STAT)? NO      4. What is your medication issue?  Per pt she does not think it is working having more Palpitation spells

## 2018-01-08 NOTE — Telephone Encounter (Signed)
Could we please have her wear a 7 day Zio for palpitations? MCr

## 2018-01-08 NOTE — Telephone Encounter (Signed)
Spoke with pt. Pt sts that she has been having increased palpitations over the last couple of weeks. See is taking Propanolol ER 160 bid and Propanolol SR 20mg  prn for break through.  Pt sts that she may need an adjustment in her dosage, the palpitations have increased in frequency and they are worrisome. She denies any other symptoms.  Adv pt that I will fwd an update to Dr.Croitoru and we will call back with his response. Pt agreeable with plan.

## 2018-01-08 NOTE — Telephone Encounter (Signed)
Called to give pt Dr.C's recommendation. [email or web address].

## 2018-01-08 NOTE — Telephone Encounter (Signed)
Pt is aware of Dr.Croitoru's recommendation for her to wear a 7 day Ziopatch monitor for palpitations. Pt is in agreement. Adv her that I will fwd the message to scheduledling to to call her to schedule the monitor appt.

## 2018-01-14 ENCOUNTER — Ambulatory Visit (INDEPENDENT_AMBULATORY_CARE_PROVIDER_SITE_OTHER): Payer: BLUE CROSS/BLUE SHIELD

## 2018-01-14 ENCOUNTER — Other Ambulatory Visit: Payer: Self-pay | Admitting: Cardiovascular Disease

## 2018-01-14 DIAGNOSIS — R002 Palpitations: Secondary | ICD-10-CM | POA: Diagnosis not present

## 2018-01-22 ENCOUNTER — Encounter (HOSPITAL_COMMUNITY): Payer: Self-pay

## 2018-01-22 ENCOUNTER — Other Ambulatory Visit: Payer: Self-pay

## 2018-01-22 ENCOUNTER — Emergency Department (HOSPITAL_COMMUNITY)
Admission: EM | Admit: 2018-01-22 | Discharge: 2018-01-22 | Disposition: A | Discharge status: Home / Self Care | Payer: BLUE CROSS/BLUE SHIELD | Attending: Emergency Medicine | Admitting: Emergency Medicine

## 2018-01-22 DIAGNOSIS — I10 Essential (primary) hypertension: Secondary | ICD-10-CM | POA: Diagnosis not present

## 2018-01-22 DIAGNOSIS — Z79899 Other long term (current) drug therapy: Secondary | ICD-10-CM | POA: Diagnosis not present

## 2018-01-22 DIAGNOSIS — Z7982 Long term (current) use of aspirin: Secondary | ICD-10-CM | POA: Diagnosis not present

## 2018-01-22 DIAGNOSIS — R110 Nausea: Secondary | ICD-10-CM | POA: Diagnosis present

## 2018-01-22 LAB — COMPREHENSIVE METABOLIC PANEL
ALBUMIN: 3.9 g/dL (ref 3.5–5.0)
ALK PHOS: 75 U/L (ref 38–126)
ALT: 14 U/L (ref 0–44)
AST: 14 U/L — ABNORMAL LOW (ref 15–41)
Anion gap: 7 (ref 5–15)
BILIRUBIN TOTAL: 0.7 mg/dL (ref 0.3–1.2)
BUN: 14 mg/dL (ref 6–20)
CO2: 27 mmol/L (ref 22–32)
CREATININE: 1.02 mg/dL — AB (ref 0.44–1.00)
Calcium: 9.4 mg/dL (ref 8.9–10.3)
Chloride: 104 mmol/L (ref 98–111)
GFR calc Af Amer: >60 mL/min (ref >60)
GFR calc non Af Amer: >60 mL/min (ref >60)
GLUCOSE: 104 mg/dL — AB (ref 70–99)
Potassium: 3.5 mmol/L (ref 3.5–5.1)
SODIUM: 138 mmol/L (ref 135–145)
TOTAL PROTEIN: 7.8 g/dL (ref 6.5–8.1)

## 2018-01-22 LAB — LIPASE, BLOOD: Lipase: 32 U/L (ref 11–51)

## 2018-01-22 LAB — URINALYSIS, ROUTINE W REFLEX MICROSCOPIC
BILIRUBIN URINE: NEGATIVE
Glucose, UA: NEGATIVE mg/dL
HGB URINE DIPSTICK: NEGATIVE
Ketones, ur: NEGATIVE mg/dL
Nitrite: NEGATIVE
PROTEIN: NEGATIVE mg/dL
Specific Gravity, Urine: 1.021 (ref 1.005–1.030)
pH: 5 (ref 5.0–8.0)

## 2018-01-22 LAB — CBC WITH DIFFERENTIAL/PLATELET
Abs Immature Granulocytes: 0.04 10*3/uL (ref 0.00–0.07)
Basophils Absolute: 0 10*3/uL (ref 0.0–0.1)
Basophils Relative: 0 %
EOS PCT: 4 %
Eosinophils Absolute: 0.4 10*3/uL (ref 0.0–0.5)
HEMATOCRIT: 37.1 % (ref 36.0–46.0)
HEMOGLOBIN: 11.6 g/dL — AB (ref 12.0–15.0)
Immature Granulocytes: 0 %
LYMPHS ABS: 2.7 10*3/uL (ref 0.7–4.0)
LYMPHS PCT: 26 %
MCH: 26.7 pg (ref 26.0–34.0)
MCHC: 31.3 g/dL (ref 30.0–36.0)
MCV: 85.3 fL (ref 80.0–100.0)
MONO ABS: 0.8 10*3/uL (ref 0.1–1.0)
MONOS PCT: 8 %
Neutro Abs: 6.4 10*3/uL (ref 1.7–7.7)
Neutrophils Relative %: 62 %
Platelets: 387 10*3/uL (ref 150–400)
RBC: 4.35 MIL/uL (ref 3.87–5.11)
RDW: 16.4 % — ABNORMAL HIGH (ref 11.5–15.5)
WBC: 10.4 10*3/uL (ref 4.0–10.5)
nRBC: 0 % (ref 0.0–0.2)

## 2018-01-22 LAB — POC URINE PREG, ED: PREG TEST UR: NEGATIVE

## 2018-01-22 MED ORDER — PROMETHAZINE HCL 25 MG PO TABS: 25.0000 mg | ORAL_TABLET | Freq: Four times a day (QID) | ORAL | 0 refills | Status: DC | PRN

## 2018-01-22 MED ORDER — GI COCKTAIL ~~LOC~~
30.0000 mL | Freq: Once | ORAL | Status: AC
  Administered 2018-01-22: 30 mL via ORAL
  Filled 2018-01-22: qty 30

## 2018-01-22 NOTE — Discharge Instructions (Signed)
Use the phenergan as prescribed if needed for nausea.  You may also take maalox or mylanta if needed per the label instructions  if needed for acid reflux or persistent symptoms.  The "GI cocktail" we gave you here is not available as a prescription but the main ingredient is maalox.

## 2018-01-22 NOTE — ED Notes (Signed)
No c/o nausea

## 2018-01-22 NOTE — ED Provider Notes (Signed)
[facility] Provider Note   CSN: [id number] Arrival date & time: 01/22/18  1430     History   Chief Complaint Chief Complaint  Patient presents with  . Nausea    HPI Summer Carpenter is a 52 y.o. female with noncontributory past medical history except for GERD, on daily protonix presenting with persistent nausea and early satiety for the past week.  She is followed by a GI specialist in [place] and is scheduled for a colonoscopy/endoscopy in 2 weeks but was not prescribed any medications for nausea, stating she left several messages with the office but with no response.  She denies chest pain, sob, abdominal pain, no vomiting or diarrhea and no sx of dysuria or back pain.  She has been prescribed zofran in the past but states phenergan works better for her.  She has found no alleviators for her symptoms.  The history is provided by the patient.    Past Medical History:  Diagnosis Date  . Anxiety   . GERD (gastroesophageal reflux disease)   . History of echocardiogram    a. 12/2012: EF 55-60%, no WMA, trivial TR  . Hyperlipidemia   . Hypertension   . Obesity   . Palpitations   . Prediabetes   . Sleep apnea     Patient Active Problem List   Diagnosis Date Noted  . Normal coronary arteries 10/09/2016  . Chest pain at rest 10/09/2016  . Hypokalemia 08/28/2013  . Anemia 08/28/2013  . mild OSA (obstructive sleep apnea) 12/22/2012  . Fatigue 10/08/2012  . Snoring 10/08/2012  . Palpitations 09/14/2012  . Essential hypertension 09/14/2012  . Hyperlipidemia 09/14/2012  . Obesity (BMI 35.0-39.9 without comorbidity) 09/14/2012    Past Surgical History:  Procedure Laterality Date  . CARDIAC SURGERY     catheterization in 2002  . CERVICAL POLYPECTOMY N/A 12/15/2014   Procedure: ENDOMETRIAL POLYPECTOMY;  Surgeon: Florian Buff, MD;  Location: [facility];  Service: Gynecology;  Laterality: N/A;  . CHOLECYSTECTOMY    . DILITATION & CURRETTAGE/HYSTROSCOPY  WITH NOVASURE ABLATION N/A 12/15/2014   Procedure: DILATATION & CURETTAGE/HYSTEROSCOPY WITH ENDOMETRIAL NOVASURE ABLATION;  Surgeon: Florian Buff, MD;  Location: [facility];  Service: Gynecology;  Laterality: N/A;  . DOPPLER ECHOCARDIOGRAPHY  07/30/2000 [place]   EF 55-66%,lv normal  . NM MYOCAR PERF WALL MOTION  10/22/2011  . OOPHORECTOMY  2008   lt     OB History    Gravida  6   Para  3   Term  3   Preterm      AB  3   Living  3     SAB  3   TAB      Ectopic      Multiple      Live Births  3            Home Medications    Prior to Admission medications   Medication Sig Start Date End Date Taking? Authorizing Provider  acetaminophen (TYLENOL) 500 MG tablet Take 1,000 mg by mouth every 6 (six) hours as needed for mild pain or moderate pain.     [provider]  aspirin EC 81 MG tablet Take 81 mg by mouth daily.      [provider]  losartan-hydrochlorothiazide (HYZAAR) 50-12.5 MG tablet TAKE 1 TABLET BY MOUTH EVERY DAY 01/14/18   Croitoru, Mihai, MD  pantoprazole (PROTONIX) 40 MG tablet TAKE 1 TABLET (40 MG TOTAL) BY MOUTH DAILY. 06/11/17   Tania Ade  H, MD  potassium chloride (K-DUR) 10 MEQ tablet TAKE 1 TABLET BY MOUTH EVERY DAY 11/05/17   Croitoru, Mihai, MD  potassium chloride (KLOR-CON M10) 10 MEQ tablet Take 1 tablet (10 mEq total) by mouth daily. 06/28/17   Croitoru, Mihai, MD  promethazine (PHENERGAN) 25 MG tablet Take 1 tablet (25 mg total) by mouth every 6 (six) hours as needed for nausea or vomiting. 01/22/18   Evalee Jefferson, PA-C  propranolol ER (INDERAL LA) 160 MG SR capsule Take 1 capsule (160 mg total) by mouth 2 (two) times daily. 03/15/17   Croitoru, Mihai, MD  sertraline (ZOLOFT) 50 MG tablet Take 50 mg by mouth every evening.     [provider]  simvastatin (ZOCOR) 10 MG tablet Take 10 mg by mouth at bedtime.     [provider]  sucralfate (CARAFATE) 1 G tablet Take 1 tablet by mouth 3 (three) times daily before  meals. 01/08/15   [provider]  valACYclovir (VALTREX) 500 MG tablet Take 1 tablet (500 mg total) by mouth daily. 07/30/17   Christin Fudge, CNM    Family History Family History  Problem Relation Age of Onset  . Hypertension Mother   . Heart attack Mother   . Seizures Father   . Hypertension Brother   . Hypertension Brother   . Hypertension Sister   . Early death Brother   . Hypertension Son     Social History Social History   Tobacco Use  . Smoking status: Never Smoker  . Smokeless tobacco: Never Used  Substance Use Topics  . Alcohol use: Yes    Comment: Occasionally, once a month  . Drug use: No     Allergies   Septra [bactrim] and Sulfamethoxazole-trimethoprim   Review of Systems Review of Systems  Constitutional: Negative for chills and fever.  HENT: Negative for congestion and sore throat.   Eyes: Negative.   Respiratory: Negative for chest tightness and shortness of breath.   Cardiovascular: Negative for chest pain.  Gastrointestinal: Positive for nausea. Negative for abdominal distention, abdominal pain, constipation, diarrhea and vomiting.  Genitourinary: Negative.   Musculoskeletal: Negative for arthralgias, back pain, joint swelling and neck pain.  Skin: Negative.  Negative for rash and wound.  Neurological: Negative for dizziness, weakness, light-headedness, numbness and headaches.  Psychiatric/Behavioral: Negative.      Physical Exam Updated Vital Signs BP 129/88 (BP Location: Right Arm)   Pulse 63   Temp 97.8 F (36.6 C) (Oral)   Resp 16   Ht 5\' 7"  (1.702 m)   Wt 107 kg   SpO2 100%   BMI 36.96 kg/m   Physical Exam  Constitutional: She appears well-developed and well-nourished.  HENT:  Head: Normocephalic and atraumatic.  Eyes: Conjunctivae are normal.  Neck: Normal range of motion.  Cardiovascular: Normal rate, regular rhythm, normal heart sounds and intact distal pulses.  Pulmonary/Chest: Effort normal and breath  sounds normal. She has no wheezes.  Abdominal: Soft. Bowel sounds are normal. She exhibits no distension and no mass. There is no tenderness. There is no rebound and no guarding.  Musculoskeletal: Normal range of motion.  Neurological: She is alert.  Skin: Skin is warm and dry.  Psychiatric: She has a normal mood and affect.  Nursing note and vitals reviewed.    ED Treatments / Results  Labs (all labs ordered are listed, but only abnormal results are displayed) Labs Reviewed  COMPREHENSIVE METABOLIC PANEL - Abnormal; Notable for the following components:  Result Value   Glucose, Bld 104 (*)    Creatinine, Ser 1.02 (*)    AST 14 (*)    All other components within normal limits  CBC WITH DIFFERENTIAL/PLATELET - Abnormal; Notable for the following components:   Hemoglobin 11.6 (*)    RDW 16.4 (*)    All other components within normal limits  URINALYSIS, ROUTINE W REFLEX MICROSCOPIC - Abnormal; Notable for the following components:   APPearance HAZY (*)    Leukocytes, UA TRACE (*)    Bacteria, UA RARE (*)    All other components within normal limits  LIPASE, BLOOD  POC URINE PREG, ED    EKG EKG Interpretation  Date/Time:  Wednesday January 22 2018 14:43:11 EDT Ventricular Rate:  68 PR Interval:  122 QRS Duration: 102 QT Interval:  402 QTC Calculation: 427 R Axis:   21 Text Interpretation:  Normal sinus rhythm Incomplete right bundle branch block Borderline ECG Confirmed by Nat Christen [id number]) on 01/22/2018 3:54:49 PM   Radiology No results found.  Procedures Procedures (including critical care time)  Medications Ordered in ED Medications  gi cocktail (Maalox,Lidocaine,Donnatal) (30 mLs Oral Given 01/22/18 1512)     Initial Impression / Assessment and Plan / ED Course  I have reviewed the triage vital signs and the nursing notes.  Pertinent labs & imaging results that were available during my care of the patient were reviewed by me and considered in my  medical decision making (see chart for details).     Labs reviewed and discussed, no acute findings. Nausea of unclear etiology, ekg no acute findings.  Sx resolved after given gi cocktail.  Phenergan prescribed, advised f/u with GI as planned.  Final Clinical Impressions(s) / ED Diagnoses   Final diagnoses:  Nausea    ED Discharge Orders         Ordered    promethazine (PHENERGAN) 25 MG tablet  Every 6 hours PRN     01/22/18 1634           Evalee Jefferson, PA-C 01/22/18 [place], [place], DO 01/23/18 [phone]

## 2018-01-22 NOTE — ED Triage Notes (Signed)
Pt came to ED today with complaints of persistent nausea x 1 week. No vomiting or diarrhea per patient. Pt denies abdominal pain. Pt states she seen Dr Posey Pronto and is scheduled for colonoscopy Oct. 30th.

## 2018-01-27 ENCOUNTER — Encounter: Payer: Self-pay | Admitting: Obstetrics & Gynecology

## 2018-01-27 ENCOUNTER — Ambulatory Visit: Payer: BLUE CROSS/BLUE SHIELD | Admitting: Obstetrics & Gynecology

## 2018-01-27 VITALS — BP 110/71 | HR 77 | Ht 67.0 in | Wt 239.0 lb

## 2018-01-27 DIAGNOSIS — N938 Other specified abnormal uterine and vaginal bleeding: Secondary | ICD-10-CM | POA: Diagnosis not present

## 2018-01-27 MED ORDER — MEGESTROL ACETATE 40 MG PO TABS: ORAL_TABLET | ORAL | 3 refills | Status: DC

## 2018-01-27 NOTE — Progress Notes (Signed)
Chief Complaint  Patient presents with  . Menorrhagia      52 y.o. [id number] Patient's last menstrual period was 01/07/2018 (exact date). The current method of family planning is condoms .  Outpatient Encounter Medications as of 01/27/2018  Medication Sig  . acetaminophen (TYLENOL) 500 MG tablet Take 1,000 mg by mouth every 6 (six) hours as needed for mild pain or moderate pain.   Marland Kitchen aspirin EC 81 MG tablet Take 81 mg by mouth daily.    Marland Kitchen losartan-hydrochlorothiazide (HYZAAR) 50-12.5 MG tablet TAKE 1 TABLET BY MOUTH EVERY DAY  . metroNIDAZOLE (FLAGYL) 500 MG tablet Take 500 mg by mouth 3 (three) times daily.  . pantoprazole (PROTONIX) 40 MG tablet TAKE 1 TABLET (40 MG TOTAL) BY MOUTH DAILY.  Marland Kitchen potassium chloride (K-DUR) 10 MEQ tablet TAKE 1 TABLET BY MOUTH EVERY DAY  . promethazine (PHENERGAN) 25 MG tablet Take 1 tablet (25 mg total) by mouth every 6 (six) hours as needed for nausea or vomiting.  . propranolol ER (INDERAL LA) 160 MG SR capsule Take 1 capsule (160 mg total) by mouth 2 (two) times daily.  . sertraline (ZOLOFT) 50 MG tablet Take 50 mg by mouth every evening.   . simvastatin (ZOCOR) 10 MG tablet Take 10 mg by mouth at bedtime.   . valACYclovir (VALTREX) 500 MG tablet Take 1 tablet (500 mg total) by mouth daily.  . megestrol (MEGACE) 40 MG tablet 3 tablets a day for 5 days, 2 tablets a day for 5 days then 1 tablet daily  . potassium chloride (KLOR-CON M10) 10 MEQ tablet Take 1 tablet (10 mEq total) by mouth daily. (Patient not taking: Reported on 01/27/2018)  . sucralfate (CARAFATE) 1 G tablet Take 1 tablet by mouth 3 (three) times daily before meals.   No facility-administered encounter medications on file as of 01/27/2018.     Subjective Pt has ablation 12/2014 Not adrop of blood since then until August then bled 3-4 days light Started bleeding 01/07/2018 non stop with heavy cramping Past Medical History:  Diagnosis Date  . Anxiety   . GERD (gastroesophageal  reflux disease)   . History of echocardiogram    a. 12/2012: EF 55-60%, no WMA, trivial TR  . Hyperlipidemia   . Hypertension   . Obesity   . Palpitations   . Prediabetes   . Sleep apnea     Past Surgical History:  Procedure Laterality Date  . CARDIAC SURGERY     catheterization in 2002  . CERVICAL POLYPECTOMY N/A 12/15/2014   Procedure: ENDOMETRIAL POLYPECTOMY;  Surgeon: Florian Buff, MD;  Location: [facility];  Service: Gynecology;  Laterality: N/A;  . CHOLECYSTECTOMY    . DILITATION & CURRETTAGE/HYSTROSCOPY WITH NOVASURE ABLATION N/A 12/15/2014   Procedure: DILATATION & CURETTAGE/HYSTEROSCOPY WITH ENDOMETRIAL NOVASURE ABLATION;  Surgeon: Florian Buff, MD;  Location: [facility];  Service: Gynecology;  Laterality: N/A;  . DOPPLER ECHOCARDIOGRAPHY  07/30/2000 [place]   EF 55-66%,lv normal  . NM MYOCAR PERF WALL MOTION  10/22/2011  . OOPHORECTOMY  2008   lt    OB History    Gravida  6   Para  3   Term  3   Preterm      AB  3   Living  3     SAB  3   TAB      Ectopic      Multiple      Live Births  3  Allergies  Allergen Reactions  . Septra [Bactrim] Hives and Diarrhea  . Sulfamethoxazole-Trimethoprim Hives and Diarrhea    Social History   Socioeconomic History  . Marital status: Single    Spouse name: Not on file  . Number of children: Not on file  . Years of education: Not on file  . Highest education level: Not on file  Occupational History  . Not on file  Social Needs  . Financial resource strain: Not on file  . Food insecurity:    Worry: Not on file    Inability: Not on file  . Transportation needs:    Medical: Not on file    Non-medical: Not on file  Tobacco Use  . Smoking status: Never Smoker  . Smokeless tobacco: Never Used  Substance and Sexual Activity  . Alcohol use: Yes    Comment: Occasionally, once a month  . Drug use: No  . Sexual activity: Yes    Birth control/protection: None  Lifestyle  . Physical activity:      Days per week: Not on file    Minutes per session: Not on file  . Stress: Not on file  Relationships  . Social connections:    Talks on phone: Not on file    Gets together: Not on file    Attends religious service: Not on file    Active member of club or organization: Not on file    Attends meetings of clubs or organizations: Not on file    Relationship status: Not on file  Other Topics Concern  . Not on file  Social History Narrative  . Not on file    Family History  Problem Relation Age of Onset  . Hypertension Mother   . Heart attack Mother   . Seizures Father   . Hypertension Brother   . Hypertension Brother   . Hypertension Sister   . Early death Brother   . Hypertension Son     Medications:       Current Outpatient Medications:  .  acetaminophen (TYLENOL) 500 MG tablet, Take 1,000 mg by mouth every 6 (six) hours as needed for mild pain or moderate pain. , Disp: , Rfl:  .  aspirin EC 81 MG tablet, Take 81 mg by mouth daily.  , Disp: , Rfl:  .  losartan-hydrochlorothiazide (HYZAAR) 50-12.5 MG tablet, TAKE 1 TABLET BY MOUTH EVERY DAY, Disp: 90 tablet, Rfl: 3 .  metroNIDAZOLE (FLAGYL) 500 MG tablet, Take 500 mg by mouth 3 (three) times daily., Disp: , Rfl:  .  pantoprazole (PROTONIX) 40 MG tablet, TAKE 1 TABLET (40 MG TOTAL) BY MOUTH DAILY., Disp: 90 tablet, Rfl: 3 .  potassium chloride (K-DUR) 10 MEQ tablet, TAKE 1 TABLET BY MOUTH EVERY DAY, Disp: 90 tablet, Rfl: 1 .  promethazine (PHENERGAN) 25 MG tablet, Take 1 tablet (25 mg total) by mouth every 6 (six) hours as needed for nausea or vomiting., Disp: 20 tablet, Rfl: 0 .  propranolol ER (INDERAL LA) 160 MG SR capsule, Take 1 capsule (160 mg total) by mouth 2 (two) times daily., Disp: 180 capsule, Rfl: 3 .  sertraline (ZOLOFT) 50 MG tablet, Take 50 mg by mouth every evening. , Disp: , Rfl:  .  simvastatin (ZOCOR) 10 MG tablet, Take 10 mg by mouth at bedtime. , Disp: , Rfl:  .  valACYclovir (VALTREX) 500 MG tablet, Take  1 tablet (500 mg total) by mouth daily., Disp: 30 tablet, Rfl: 11 .  megestrol (MEGACE) 40 MG tablet,  3 tablets a day for 5 days, 2 tablets a day for 5 days then 1 tablet daily, Disp: 45 tablet, Rfl: 3 .  potassium chloride (KLOR-CON M10) 10 MEQ tablet, Take 1 tablet (10 mEq total) by mouth daily. (Patient not taking: Reported on 01/27/2018), Disp: 90 tablet, Rfl: 1 .  sucralfate (CARAFATE) 1 G tablet, Take 1 tablet by mouth 3 (three) times daily before meals., Disp: , Rfl: 0  Objective Blood pressure 110/71, pulse 77, height 5\' 7"  (1.702 m), weight 239 lb (108.4 kg), last menstrual period 01/07/2018.  Gen WDWN NAD  Pertinent ROS No burning with urination, frequency or urgency No nausea, vomiting or diarrhea Nor fever chills or other constitutional symptoms   Labs or studies Sonogram ordered    Impression Diagnoses this Encounter::   ICD-10-CM   1. DUB (dysfunctional uterine bleeding) N93.8 US PELVIS TRANSVANGINAL NON-OB (TV ONLY)    US PELVIS (TRANSABDOMINAL ONLY)    Established relevant diagnosis(es):   Plan/Recommendations: Meds ordered this encounter  Medications  . megestrol (MEGACE) 40 MG tablet    Sig: 3 tablets a day for 5 days, 2 tablets a day for 5 days then 1 tablet daily    Dispense:  45 tablet    Refill:  3    Labs or Scans Ordered: Orders Placed This Encounter  Procedures  . US PELVIS TRANSVANGINAL NON-OB (TV ONLY)  . US PELVIS (TRANSABDOMINAL ONLY)    Management:: Megestrol for bleeding Sonogram to evaluate uterus   Follow up Return in about 3 weeks (around 02/17/2018) for cancel appt prviously made, , GYN sono, Follow up, with Dr Elonda Husky.        Face to face time:  15 minutes  Greater than 50% of the visit time was spent in counseling and coordination of care with the patient.  The summary and outline of the counseling and care coordination is summarized in the note above.   All questions were answered.

## 2018-01-30 ENCOUNTER — Telehealth: Payer: Self-pay

## 2018-01-30 MED ORDER — POTASSIUM CHLORIDE ER 10 MEQ PO TBCR: 10.0000 meq | EXTENDED_RELEASE_TABLET | Freq: Two times a day (BID) | ORAL | 3 refills | Status: DC

## 2018-01-30 NOTE — Telephone Encounter (Signed)
-----   Message from Sanda Klein, MD sent at 01/28/2018  8:28 AM EDT ----- No significant rhythm problems seen during the recording period. Potassium was low normal at recent labs, which can increase rhythm irritability. Please increase KCl to 10 mEq twice daily

## 2018-01-30 NOTE — Telephone Encounter (Signed)
Medical record updated 

## 2018-02-13 ENCOUNTER — Other Ambulatory Visit: Payer: PRIVATE HEALTH INSURANCE | Admitting: Obstetrics and Gynecology

## 2018-02-17 ENCOUNTER — Ambulatory Visit (INDEPENDENT_AMBULATORY_CARE_PROVIDER_SITE_OTHER): Payer: BLUE CROSS/BLUE SHIELD | Admitting: Obstetrics & Gynecology

## 2018-02-17 ENCOUNTER — Encounter: Payer: Self-pay | Admitting: Obstetrics & Gynecology

## 2018-02-17 ENCOUNTER — Ambulatory Visit: Payer: BLUE CROSS/BLUE SHIELD

## 2018-02-17 VITALS — BP 119/71 | HR 65 | Ht 67.0 in | Wt 232.0 lb

## 2018-02-17 DIAGNOSIS — N938 Other specified abnormal uterine and vaginal bleeding: Secondary | ICD-10-CM

## 2018-02-17 DIAGNOSIS — D219 Benign neoplasm of connective and other soft tissue, unspecified: Secondary | ICD-10-CM

## 2018-02-17 MED ORDER — PROGESTERONE MICRONIZED 200 MG PO CAPS: 200.0000 mg | ORAL_CAPSULE | Freq: Every day | ORAL | 11 refills | Status: DC

## 2018-02-17 NOTE — Progress Notes (Signed)
PELVIC US TA/TV: heterogeneous anteverted uterus w/mult.fibroids,largest fibroids (#1)post mid uterus intramural 2.5 x 1.8 x 2.5 cm,(#2) mid posterior submucosal fibroid  1.9 x 1.9 x 2.5 cm,(#3) mid right intramural  2.5 x 2.5 x 1.9 cm,(#4) intramural fundal fibroid 2.9 x 1.8 x 2.2 cm,EEC 3.5 cm,normal right ovary,left oophorectomy,left adnexa wnl,no free fluid,mult simple nabothian cysts

## 2018-02-17 NOTE — Progress Notes (Signed)
Follow up appointment for results  Chief Complaint  Patient presents with  . Follow-up    u/s today    Blood pressure 119/71, pulse 65, height 5\' 7"  (1.702 m), weight 232 lb (105.2 kg), last menstrual period 01/10/2018.   Stopped bleeding 2 weeks into the megestrol  US Pelvis Transvanginal Non-ob (tv Only)  Result Date: 02/17/2018 GYNECOLOGIC SONOGRAM Summer Carpenter is a 52 y.o. [id number] LMP 01/10/2018,she is here for a pelvic sonogram for dysfunctional uterine bleeding s/p ablation.Marland Kitchen Uterus                      7.4 x 5.8 x 7.2 cm, vol 162 ml, heterogeneous anteverted uterus w/mult.fibroids Endometrium          3.5 mm, symmetrical, endometrium is slightly displaced from the submucosal fibroid Right ovary             2.9 x 1.5 x 3.5 cm, wnl Left ovary               Left oophorectomy,left adnexa wnl Fibroids                   (#1)post mid uterus intramural 2.5 x 1.8 x 2.5 cm,(#2) mid posterior submucosal fibroid  1.9 x 1.9 x 2.5 cm,(#3) mid right intramural  2.5 x 2.5 x 1.9 cm,(#4) intramural fundal fibroid 2.9 x 1.8 x 2.2 cm Technician Comments: PELVIC US TA/TV: heterogeneous anteverted uterus w/mult.fibroids,largest fibroids (#1)post mid uterus intramural 2.5 x 1.8 x 2.5 cm,(#2) mid posterior submucosal fibroid  1.9 x 1.9 x 2.5 cm,(#3) mid right intramural  2.5 x 2.5 x 1.9 cm,(#4) intramural fundal fibroid 2.9 x 1.8 x 2.2 cm,EEC 3.5 cm,normal right ovary,left oophorectomy,left adnexa wnl,no free fluid,mult simple nabothian cysts Silver Huguenin 02/17/2018 9:21 AM Clinical Impression and recommendations: I have reviewed the sonogram results above, combined with the patient's current clinical course, below are my impressions and any appropriate recommendations for management based on the sonographic findings. Uterus is significant for multiple small myomas which are known Endometrium thin normal no hematometra Right ovary normal Left ovary absent Florian Buff 02/17/2018 9:42 AM   US Pelvis  (transabdominal Only)  Result Date: 02/17/2018 GYNECOLOGIC SONOGRAM Summer Carpenter is a 52 y.o. [id number] LMP 01/10/2018,she is here for a pelvic sonogram for dysfunctional uterine bleeding s/p ablation.Marland Kitchen Uterus                      7.4 x 5.8 x 7.2 cm, vol 162 ml, heterogeneous anteverted uterus w/mult.fibroids Endometrium          3.5 mm, symmetrical, endometrium is slightly displaced from the submucosal fibroid Right ovary             2.9 x 1.5 x 3.5 cm, wnl Left ovary               Left oophorectomy,left adnexa wnl Fibroids                   (#1)post mid uterus intramural 2.5 x 1.8 x 2.5 cm,(#2) mid posterior submucosal fibroid  1.9 x 1.9 x 2.5 cm,(#3) mid right intramural  2.5 x 2.5 x 1.9 cm,(#4) intramural fundal fibroid 2.9 x 1.8 x 2.2 cm Technician Comments: PELVIC US TA/TV: heterogeneous anteverted uterus w/mult.fibroids,largest fibroids (#1)post mid uterus intramural 2.5 x 1.8 x 2.5 cm,(#2) mid posterior submucosal fibroid  1.9 x 1.9 x 2.5 cm,(#3) mid right intramural  2.5 x 2.5 x  1.9 cm,(#4) intramural fundal fibroid 2.9 x 1.8 x 2.2 cm,EEC 3.5 cm,normal right ovary,left oophorectomy,left adnexa wnl,no free fluid,mult simple nabothian cysts Silver Huguenin 02/17/2018 9:21 AM Clinical Impression and recommendations: I have reviewed the sonogram results above, combined with the patient's current clinical course, below are my impressions and any appropriate recommendations for management based on the sonographic findings. Uterus is significant for multiple small myomas which are known Endometrium thin normal no hematometra Right ovary normal Left ovary absent Florian Buff 02/17/2018 9:42 AM      MEDS ordered this encounter: Meds ordered this encounter  Medications  . progesterone (PROMETRIUM) 200 MG capsule    Sig: Take 1 capsule (200 mg total) by mouth daily.    Dispense:  30 capsule    Refill:  11    Orders for this encounter: No orders of the defined types were placed in this  encounter.   Impression: DUB (dysfunctional uterine bleeding)  Fibroids    Plan: >stop megestrol now >begin prometrium 200 mg n 1 week  Hopefully some relatively low dose progesterone will stabilize her endometrium so she can make it to menopause relatively unremarkably  Follow Up: Return if symptoms worsen or fail to improve, for yearly.       Face to face time:  15 minutes  Greater than 50% of the visit time was spent in counseling and coordination of care with the patient.  The summary and outline of the counseling and care coordination is summarized in the note above.   All questions were answered.  Past Medical History:  Diagnosis Date  . Anxiety   . GERD (gastroesophageal reflux disease)   . History of echocardiogram    a. 12/2012: EF 55-60%, no WMA, trivial TR  . Hyperlipidemia   . Hypertension   . Obesity   . Palpitations   . Prediabetes   . Sleep apnea     Past Surgical History:  Procedure Laterality Date  . CARDIAC SURGERY     catheterization in 2002  . CERVICAL POLYPECTOMY N/A 12/15/2014   Procedure: ENDOMETRIAL POLYPECTOMY;  Surgeon: Florian Buff, MD;  Location: [facility];  Service: Gynecology;  Laterality: N/A;  . CHOLECYSTECTOMY    . DILITATION & CURRETTAGE/HYSTROSCOPY WITH NOVASURE ABLATION N/A 12/15/2014   Procedure: DILATATION & CURETTAGE/HYSTEROSCOPY WITH ENDOMETRIAL NOVASURE ABLATION;  Surgeon: Florian Buff, MD;  Location: [facility];  Service: Gynecology;  Laterality: N/A;  . DOPPLER ECHOCARDIOGRAPHY  07/30/2000 [place]   EF 55-66%,lv normal  . NM MYOCAR PERF WALL MOTION  10/22/2011  . OOPHORECTOMY  2008   lt    OB History    Gravida  6   Para  3   Term  3   Preterm      AB  3   Living  3     SAB  3   TAB      Ectopic      Multiple      Live Births  3           Allergies  Allergen Reactions  . Septra [Bactrim] Hives and Diarrhea  . Sulfamethoxazole-Trimethoprim Hives and Diarrhea    Social History    Socioeconomic History  . Marital status: Single    Spouse name: Not on file  . Number of children: Not on file  . Years of education: Not on file  . Highest education level: Not on file  Occupational History  . Not on file  Social Needs  . Financial  resource strain: Not on file  . Food insecurity:    Worry: Not on file    Inability: Not on file  . Transportation needs:    Medical: Not on file    Non-medical: Not on file  Tobacco Use  . Smoking status: Never Smoker  . Smokeless tobacco: Never Used  Substance and Sexual Activity  . Alcohol use: Yes    Comment: Occasionally, once a month  . Drug use: No  . Sexual activity: Yes    Birth control/protection: None  Lifestyle  . Physical activity:    Days per week: Not on file    Minutes per session: Not on file  . Stress: Not on file  Relationships  . Social connections:    Talks on phone: Not on file    Gets together: Not on file    Attends religious service: Not on file    Active member of club or organization: Not on file    Attends meetings of clubs or organizations: Not on file    Relationship status: Not on file  Other Topics Concern  . Not on file  Social History Narrative  . Not on file    Family History  Problem Relation Age of Onset  . Hypertension Mother   . Heart attack Mother   . Seizures Father   . Hypertension Brother   . Hypertension Brother   . Hypertension Sister   . Early death Brother   . Hypertension Son

## 2018-02-26 ENCOUNTER — Encounter: Payer: Self-pay | Admitting: Adult Health

## 2018-02-26 ENCOUNTER — Ambulatory Visit (INDEPENDENT_AMBULATORY_CARE_PROVIDER_SITE_OTHER): Payer: BLUE CROSS/BLUE SHIELD | Admitting: Adult Health

## 2018-02-26 VITALS — BP 120/75 | HR 69 | Ht 67.0 in | Wt 234.0 lb

## 2018-02-26 DIAGNOSIS — R195 Other fecal abnormalities: Secondary | ICD-10-CM

## 2018-02-26 DIAGNOSIS — Z1212 Encounter for screening for malignant neoplasm of rectum: Secondary | ICD-10-CM

## 2018-02-26 DIAGNOSIS — Z1211 Encounter for screening for malignant neoplasm of colon: Secondary | ICD-10-CM

## 2018-02-26 DIAGNOSIS — N938 Other specified abnormal uterine and vaginal bleeding: Secondary | ICD-10-CM

## 2018-02-26 DIAGNOSIS — Z01419 Encounter for gynecological examination (general) (routine) without abnormal findings: Secondary | ICD-10-CM | POA: Diagnosis not present

## 2018-02-26 LAB — HEMOCCULT GUIAC POC 1CARD (OFFICE): Fecal Occult Blood, POC: POSITIVE — AB

## 2018-02-26 NOTE — Progress Notes (Signed)
[name], female   DOB: [birth year], 52 y.o.   MRN: [id number] History of Present Illness: Kenitra is a 52 year old black female in for well woman gyn exam, she had  pap with negative HPV, and negative for malignancy, 11/14/16. PCP is [facility].  Current Medications, Allergies, Past Medical History, Past Surgical History, Family History and Social History were reviewed in [employer] record.     Review of Systems: Patient denies any headaches, hearing loss, fatigue, blurred vision, shortness of breath, chest pain, abdominal pain, problems with bowel movements, urination, or intercourse. No joint pain or mood swings. Had period all of October and saw Dr Elonda Husky and took megace and had [place] that showed small fibroid.    Physical Exam:BP 120/75 (BP Location: Right Arm, Patient Position: Sitting, Cuff Size: Large)   Pulse 69   Ht 5\' 7"  (1.702 m)   Wt 234 lb (106.1 kg)   LMP 01/10/2018   BMI 36.65 kg/m  General:  Well developed, well nourished, no acute distress Skin:  Warm and dry Neck:  Midline trachea, normal thyroid, good ROM, no lymphadenopathy Lungs; Clear to auscultation bilaterally Breast:  No dominant palpable mass, retraction, or nipple discharge Cardiovascular: Regular rate and rhythm Abdomen:  Soft, non tender, no hepatosplenomegaly Pelvic:  External genitalia is normal in appearance, no lesions.  The vagina is normal in appearance. Urethra has no lesions or masses. The cervix is bulbous.  Uterus is felt to be normal size, shape, and contour.  No adnexal masses or tenderness noted.Bladder is non tender, no masses felt. Rectal: Good sphincter tone, no polyps, or hemorrhoids felt.  Hemoccult positive. Extremities/musculoskeletal:  No swelling or varicosities noted, no clubbing or cyanosis Psych:  No mood changes, alert and cooperative,seems happy PHQ 9 score 3, pt takes Zoloft and denies being suicidal. Examination chaperoned by Estill Bamberg Rash  LPN.  Impression: 1. Encounter for well woman exam with routine gynecological exam   2. Screening for colorectal cancer   3. Fecal occult blood test positive   4. DUB (dysfunctional uterine bleeding)       Plan: Stop megace and take Prometrium daily as per Dr Brynda Greathouse last note  Given 3 hemoccult cards to do, but had colonoscopy end of October she says and had polyps, to repeat in 5 years Labs with PCP Physical in 1 year Pap in 2021 Mammogram yearly, had negative one 11/27/17.

## 2018-03-10 ENCOUNTER — Other Ambulatory Visit (INDEPENDENT_AMBULATORY_CARE_PROVIDER_SITE_OTHER): Payer: BLUE CROSS/BLUE SHIELD

## 2018-03-10 ENCOUNTER — Telehealth: Payer: Self-pay | Admitting: Adult Health

## 2018-03-10 DIAGNOSIS — Z1212 Encounter for screening for malignant neoplasm of rectum: Secondary | ICD-10-CM

## 2018-03-10 LAB — HEMOCCULT GUIAC POC 1CARD (OFFICE)
Card #2 Fecal Occult Blod, POC: NEGATIVE
FECAL OCCULT BLD: NEGATIVE
FECAL OCCULT BLD: NEGATIVE

## 2018-03-10 NOTE — Telephone Encounter (Signed)
Left message that all 3 hemoccult cards are negative

## 2018-04-08 ENCOUNTER — Telehealth: Payer: Self-pay | Admitting: Cardiovascular Disease

## 2018-04-08 NOTE — Telephone Encounter (Signed)
Patient returned call. She states provider at [facility] told her all checked out OK except HR was a little elevated from baseline and she was told she has a sinus infection. She has received an antibiotic for this. The provider at [facility] also told her that if she has an underlying infection, this could cause her HR to be elevated. No further assistance needed at this time.

## 2018-04-08 NOTE — Telephone Encounter (Signed)
Returned call to patient of Dr. C who reports she is currently at urgent care. She reports for the past week, her HR has been 96-100bpm which it is not normally this high. At urgent care, she reports her BP is good, HR is 91bpm. She does report a low grade fever of 99.7 for past 2 days. Explained that if she has any sort of infection, this can affect her VS readings. Suggested she call office back with update once she has had an evaluation completed at urgent care.   Notified MD as Juluis Rainier

## 2018-04-08 NOTE — Telephone Encounter (Signed)
New Message   STAT if HR is under 50 or over 120 (normal HR is 60-100 beats per minute)  1) What is your heart rate? 100  2) Do you have a log of your heart rate readings (document readings)? Yes   3) Do you have any other symptoms? Low fever last 2 days 99.7

## 2018-04-22 ENCOUNTER — Emergency Department (HOSPITAL_COMMUNITY): Payer: BLUE CROSS/BLUE SHIELD

## 2018-04-22 ENCOUNTER — Other Ambulatory Visit: Payer: Self-pay

## 2018-04-22 ENCOUNTER — Encounter (HOSPITAL_COMMUNITY): Payer: Self-pay | Admitting: Emergency Medicine

## 2018-04-22 ENCOUNTER — Emergency Department (HOSPITAL_COMMUNITY)
Admission: EM | Admit: 2018-04-22 | Discharge: 2018-04-22 | Disposition: A | Discharge status: Home / Self Care | Payer: BLUE CROSS/BLUE SHIELD | Attending: Emergency Medicine | Admitting: Emergency Medicine

## 2018-04-22 DIAGNOSIS — I10 Essential (primary) hypertension: Secondary | ICD-10-CM | POA: Insufficient documentation

## 2018-04-22 DIAGNOSIS — Z79899 Other long term (current) drug therapy: Secondary | ICD-10-CM | POA: Diagnosis not present

## 2018-04-22 DIAGNOSIS — Z7982 Long term (current) use of aspirin: Secondary | ICD-10-CM | POA: Insufficient documentation

## 2018-04-22 DIAGNOSIS — E876 Hypokalemia: Secondary | ICD-10-CM | POA: Diagnosis not present

## 2018-04-22 DIAGNOSIS — R7303 Prediabetes: Secondary | ICD-10-CM | POA: Insufficient documentation

## 2018-04-22 DIAGNOSIS — R079 Chest pain, unspecified: Secondary | ICD-10-CM

## 2018-04-22 LAB — CBC
HCT: 36.8 % (ref 36.0–46.0)
Hemoglobin: 11.7 g/dL — ABNORMAL LOW (ref 12.0–15.0)
MCH: 27.7 pg (ref 26.0–34.0)
MCHC: 31.8 g/dL (ref 30.0–36.0)
MCV: 87.2 fL (ref 80.0–100.0)
Platelets: 380 10*3/uL (ref 150–400)
RBC: 4.22 MIL/uL (ref 3.87–5.11)
RDW: 15.6 % — ABNORMAL HIGH (ref 11.5–15.5)
WBC: 7.6 10*3/uL (ref 4.0–10.5)
nRBC: 0 % (ref 0.0–0.2)

## 2018-04-22 LAB — BASIC METABOLIC PANEL
Anion gap: 9 (ref 5–15)
BUN: 13 mg/dL (ref 6–20)
CO2: 24 mmol/L (ref 22–32)
Calcium: 9.2 mg/dL (ref 8.9–10.3)
Chloride: 103 mmol/L (ref 98–111)
Creatinine, Ser: 1.16 mg/dL — ABNORMAL HIGH (ref 0.44–1.00)
GFR calc Af Amer: >60 mL/min (ref >60)
GFR calc non Af Amer: 54 mL/min — ABNORMAL LOW (ref >60)
GLUCOSE: 112 mg/dL — AB (ref 70–99)
Potassium: 3.1 mmol/L — ABNORMAL LOW (ref 3.5–5.1)
Sodium: 136 mmol/L (ref 135–145)

## 2018-04-22 LAB — TROPONIN I: Troponin I: <0.03 ng/mL (ref <0.03)

## 2018-04-22 MED ORDER — POTASSIUM CHLORIDE ER 10 MEQ PO TBCR: 10.0000 meq | EXTENDED_RELEASE_TABLET | Freq: Two times a day (BID) | ORAL | 0 refills | Status: DC

## 2018-04-22 NOTE — ED Triage Notes (Signed)
Pt c/o chest pain that started Sunday morning. Pain is on left side of chest that now radiates to her left shoulder. Denies SOB.

## 2018-04-22 NOTE — ED Provider Notes (Signed)
[facility] EMERGENCY DEPARTMENT Provider Note   CSN: [id number] Arrival date & time: 04/22/18  1342     History   Chief Complaint Chief Complaint  Patient presents with  . Chest Pain    HPI Summer Carpenter is a 53 y.o. female.  HPI     Past Medical History:  Diagnosis Date  . Anxiety   . GERD (gastroesophageal reflux disease)   . History of echocardiogram    a. 12/2012: EF 55-60%, no WMA, trivial TR  . Hyperlipidemia   . Hypertension   . Obesity   . Palpitations   . Prediabetes   . Sleep apnea     Patient Active Problem List   Diagnosis Date Noted  . Fecal occult blood test positive 02/26/2018  . Screening for colorectal cancer 02/26/2018  . Encounter for well woman exam with routine gynecological exam 02/26/2018  . DUB (dysfunctional uterine bleeding) 02/26/2018  . Normal coronary arteries 10/09/2016  . Chest pain at rest 10/09/2016  . Hypokalemia 08/28/2013  . Anemia 08/28/2013  . mild OSA (obstructive sleep apnea) 12/22/2012  . Fatigue 10/08/2012  . Snoring 10/08/2012  . Palpitations 09/14/2012  . Essential hypertension 09/14/2012  . Hyperlipidemia 09/14/2012  . Obesity (BMI 35.0-39.9 without comorbidity) 09/14/2012    Past Surgical History:  Procedure Laterality Date  . CARDIAC SURGERY     catheterization in 2002  . CERVICAL POLYPECTOMY N/A 12/15/2014   Procedure: ENDOMETRIAL POLYPECTOMY;  Surgeon: Florian Buff, MD;  Location: [facility];  Service: Gynecology;  Laterality: N/A;  . CHOLECYSTECTOMY    . DILITATION & CURRETTAGE/HYSTROSCOPY WITH NOVASURE ABLATION N/A 12/15/2014   Procedure: DILATATION & CURETTAGE/HYSTEROSCOPY WITH ENDOMETRIAL NOVASURE ABLATION;  Surgeon: Florian Buff, MD;  Location: [facility];  Service: Gynecology;  Laterality: N/A;  . DOPPLER ECHOCARDIOGRAPHY  07/30/2000 [place]   EF 55-66%,lv normal  . NM MYOCAR PERF WALL MOTION  10/22/2011  . OOPHORECTOMY  2008   lt     OB History    Gravida  6   Para  3   Term  3   Preterm      AB  3   Living  3     SAB  3   TAB      Ectopic      Multiple      Live Births  3            Home Medications    Prior to Admission medications   Medication Sig Start Date End Date Taking? Authorizing Provider  acetaminophen (TYLENOL) 500 MG tablet Take 1,000 mg by mouth every 6 (six) hours as needed for mild pain or moderate pain.    Yes [provider]  aspirin EC 81 MG tablet Take 81 mg by mouth daily.     Yes [provider]  cyclobenzaprine (FLEXERIL) 10 MG tablet Take 10 mg by mouth 3 (three) times daily as needed. For muscle spasms 03/27/18  Yes [provider]  ferrous gluconate (FERGON) 324 MG tablet Take 324 mg by mouth daily. 03/28/18  Yes [provider]  LINZESS 145 MCG CAPS capsule Take 145 mcg by mouth daily as needed (for constipation).  02/21/18  Yes [provider]  losartan-hydrochlorothiazide (HYZAAR) 50-12.5 MG tablet TAKE 1 TABLET BY MOUTH EVERY DAY Patient taking differently: Take 1 tablet by mouth daily.  01/14/18  Yes Croitoru, Mihai, MD  naproxen (NAPROSYN) 500 MG tablet Take 500 mg by mouth 2 (two) times daily as needed. For pain  03/27/18  Yes [provider]  omeprazole (PRILOSEC) 20 MG capsule Take 20 mg by mouth daily. 02/03/18  Yes [provider]  propranolol ER (INDERAL LA) 160 MG SR capsule Take 1 capsule (160 mg total) by mouth 2 (two) times daily. 03/15/17  Yes Croitoru, Mihai, MD  sertraline (ZOLOFT) 100 MG tablet Take 100 mg by mouth every evening. 03/27/18  Yes [provider]  simvastatin (ZOCOR) 10 MG tablet Take 10 mg by mouth at bedtime.    Yes [provider]  sucralfate (CARAFATE) 1 G tablet Take 1 tablet by mouth every morning.  01/08/15  Yes [provider]  valACYclovir (VALTREX) 500 MG tablet Take 1 tablet (500 mg total) by mouth daily. Patient taking differently: Take 500 mg by mouth daily as needed (for flares).  07/30/17  Yes  Cresenzo-Dishmon, Joaquim Lai, CNM  vitamin C (ASCORBIC ACID) 500 MG tablet Take 1 tablet by mouth every morning. 03/28/18  Yes [provider]  potassium chloride (K-DUR) 10 MEQ tablet Take 1 tablet (10 mEq total) by mouth 2 (two) times daily. 04/22/18   Daleen Bo, MD    Family History Family History  Problem Relation Age of Onset  . Hypertension Mother   . Heart attack Mother   . Seizures Father   . Hypertension Brother   . Hypertension Brother   . Hypertension Sister   . Early death Brother   . Hypertension Son     Social History Social History   Tobacco Use  . Smoking status: Never Smoker  . Smokeless tobacco: Never Used  Substance Use Topics  . Alcohol use: Yes    Comment: Occasionally, once a month  . Drug use: No     Allergies   Septra [bactrim] and Sulfamethoxazole-trimethoprim   Review of Systems Review of Systems  All other systems reviewed and are negative.    Physical Exam Updated Vital Signs BP 120/71 (BP Location: Right Arm)   Pulse 77   Temp 98.1 F (36.7 C) (Oral)   Resp 18   Ht 5\' 7"  (1.702 m)   Wt 106.6 kg   LMP 04/18/2018   SpO2 100%   BMI 36.81 kg/m   Physical Exam Vitals signs and nursing note reviewed.  Constitutional:      General: She is not in acute distress.    Appearance: She is well-developed. She is obese. She is not ill-appearing or diaphoretic.  HENT:     Head: Normocephalic and atraumatic.     Right Ear: External ear normal.     Left Ear: External ear normal.  Eyes:     Conjunctiva/sclera: Conjunctivae normal.     Pupils: Pupils are equal, round, and reactive to light.  Neck:     Musculoskeletal: Normal range of motion and neck supple.     Trachea: Phonation normal.  Cardiovascular:     Rate and Rhythm: Normal rate and regular rhythm.     Heart sounds: Normal heart sounds.  Pulmonary:     Effort: Pulmonary effort is normal.     Breath sounds: Normal breath sounds.  Chest:     Chest wall: Tenderness  (Left upper chest wall, mild, diffuse) present.  Musculoskeletal: Normal range of motion.  Skin:    General: Skin is warm and dry.  Neurological:     Mental Status: She is alert and oriented to person, place, and time.     Cranial Nerves: No cranial nerve deficit.     Sensory: No sensory deficit.  Motor: No abnormal muscle tone.     Coordination: Coordination normal.  Psychiatric:        Mood and Affect: Mood normal.        Behavior: Behavior normal.        Thought Content: Thought content normal.        Judgment: Judgment normal.      ED Treatments / Results  Labs (all labs ordered are listed, but only abnormal results are displayed) Labs Reviewed  BASIC METABOLIC PANEL - Abnormal; Notable for the following components:      Result Value   Potassium 3.1 (*)    Glucose, Bld 112 (*)    Creatinine, Ser 1.16 (*)    GFR calc non Af Amer 54 (*)    All other components within normal limits  CBC - Abnormal; Notable for the following components:   Hemoglobin 11.7 (*)    RDW 15.6 (*)    All other components within normal limits  TROPONIN I    EKG EKG Interpretation  Date/Time:  Tuesday April 22 2018 14:06:37 EST Ventricular Rate:  74 PR Interval:  128 QRS Duration: 102 QT Interval:  380 QTC Calculation: 421 R Axis:   49 Text Interpretation:  Normal sinus rhythm Normal ECG since last tracing no significant change Confirmed by Daleen Bo [phone]) on 04/22/2018 4:14:38 PM   Radiology Dg Chest 2 View  Result Date: 04/22/2018 CLINICAL DATA:  Chest pain for 2 days. Back pain started last night. Hypertension. EXAM: CHEST - 2 VIEW COMPARISON:  Radiographs 06/27/2017 and 10/11/2016. FINDINGS: The heart size and mediastinal contours are normal. The lungs are clear. There is no pleural effusion or pneumothorax. No acute osseous findings are identified. Degenerative changes in the spine and cholecystectomy clips are noted. IMPRESSION: Stable chest.  No active cardiopulmonary  process. Electronically Signed   By: Richardean Sale M.D.   On: 04/22/2018 14:36    Procedures Procedures (including critical care time)  Medications Ordered in ED Medications - No data to display   Initial Impression / Assessment and Plan / ED Course  I have reviewed the triage vital signs and the nursing notes.  Pertinent labs & imaging results that were available during my care of the patient were reviewed by me and considered in my medical decision making (see chart for details).      Patient Vitals for the past 24 hrs:  BP Temp Temp src Pulse Resp SpO2 Height Weight  04/22/18 1403 120/71 98.1 F (36.7 C) Oral 77 18 100 % 5\' 7"  (1.702 m) 106.6 kg    4:55 PM Reevaluation with update and discussion. After initial assessment and treatment, an updated evaluation reveals no change in clinical status, she remains comfortable, findings discussed and questions answered. Daleen Bo   Medical Decision Making: Noncardiac chest pain with reassuring evaluation for occult processes.  Doubt ACS, PE or pneumonia.  Incidental mild upper kalemia.  Doubt metabolic instability.  No indication for further ED treatment or hospitalization, at this time.  CRITICAL CARE-no Performed by: Daleen Bo  Nursing Notes Reviewed/ Care Coordinated Applicable Imaging Reviewed Interpretation of Laboratory Data incorporated into ED treatment  The patient appears reasonably screened and/or stabilized for discharge and I doubt any other medical condition or other [facility] requiring further screening, evaluation, or treatment in the ED at this time prior to discharge.  Plan: Home Medications-continue usual medications increase potassium to twice daily; Home Treatments-rest, fluids; return here if the recommended treatment, does not improve the symptoms; Recommended  follow up-PCP, PRN   Final Clinical Impressions(s) / ED Diagnoses   Final diagnoses:  Nonspecific chest pain  Hypokalemia    ED Discharge  Orders         Ordered    potassium chloride (K-DUR) 10 MEQ tablet  2 times daily     04/22/18 1658           Daleen Bo, MD 04/22/18 1700

## 2018-04-22 NOTE — Discharge Instructions (Addendum)
For the pain you are having in your chest, use Tylenol every 4 hours.  Try to eat foods which contain more potassium.  We sent a new prescription for potassium to take twice a day, to your pharmacy.

## 2018-04-29 ENCOUNTER — Telehealth: Payer: Self-pay | Admitting: Obstetrics & Gynecology

## 2018-04-29 ENCOUNTER — Other Ambulatory Visit: Payer: Self-pay | Admitting: Obstetrics & Gynecology

## 2018-04-29 IMAGING — DX DG CHEST 2V
2 series · 2 of 2 positions shown · non-contrast
Comparison: Radiographs July 12, 2015.

CLINICAL DATA: Chest pain.

EXAM:
CHEST  2 VIEW

[chest pa]
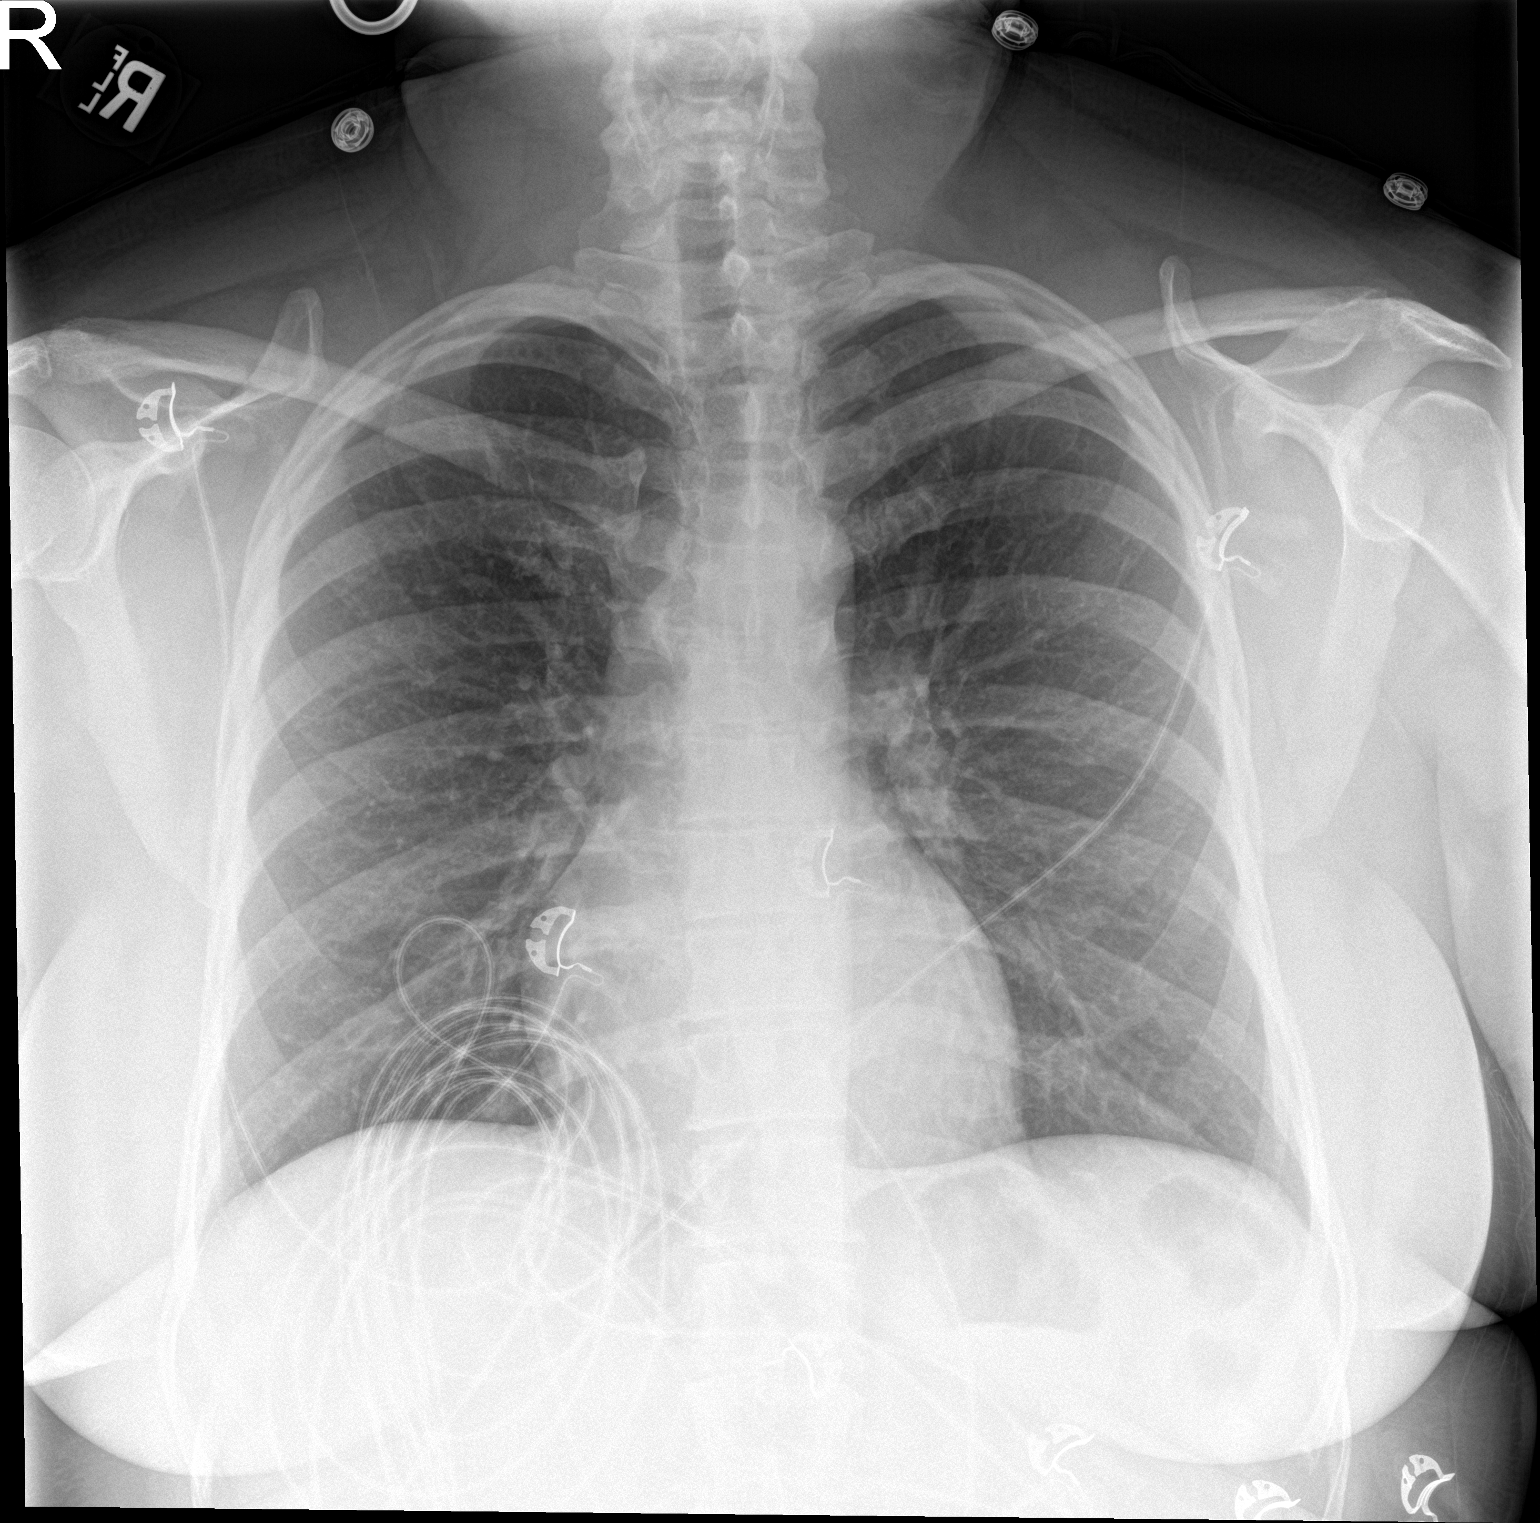

[chest lat]
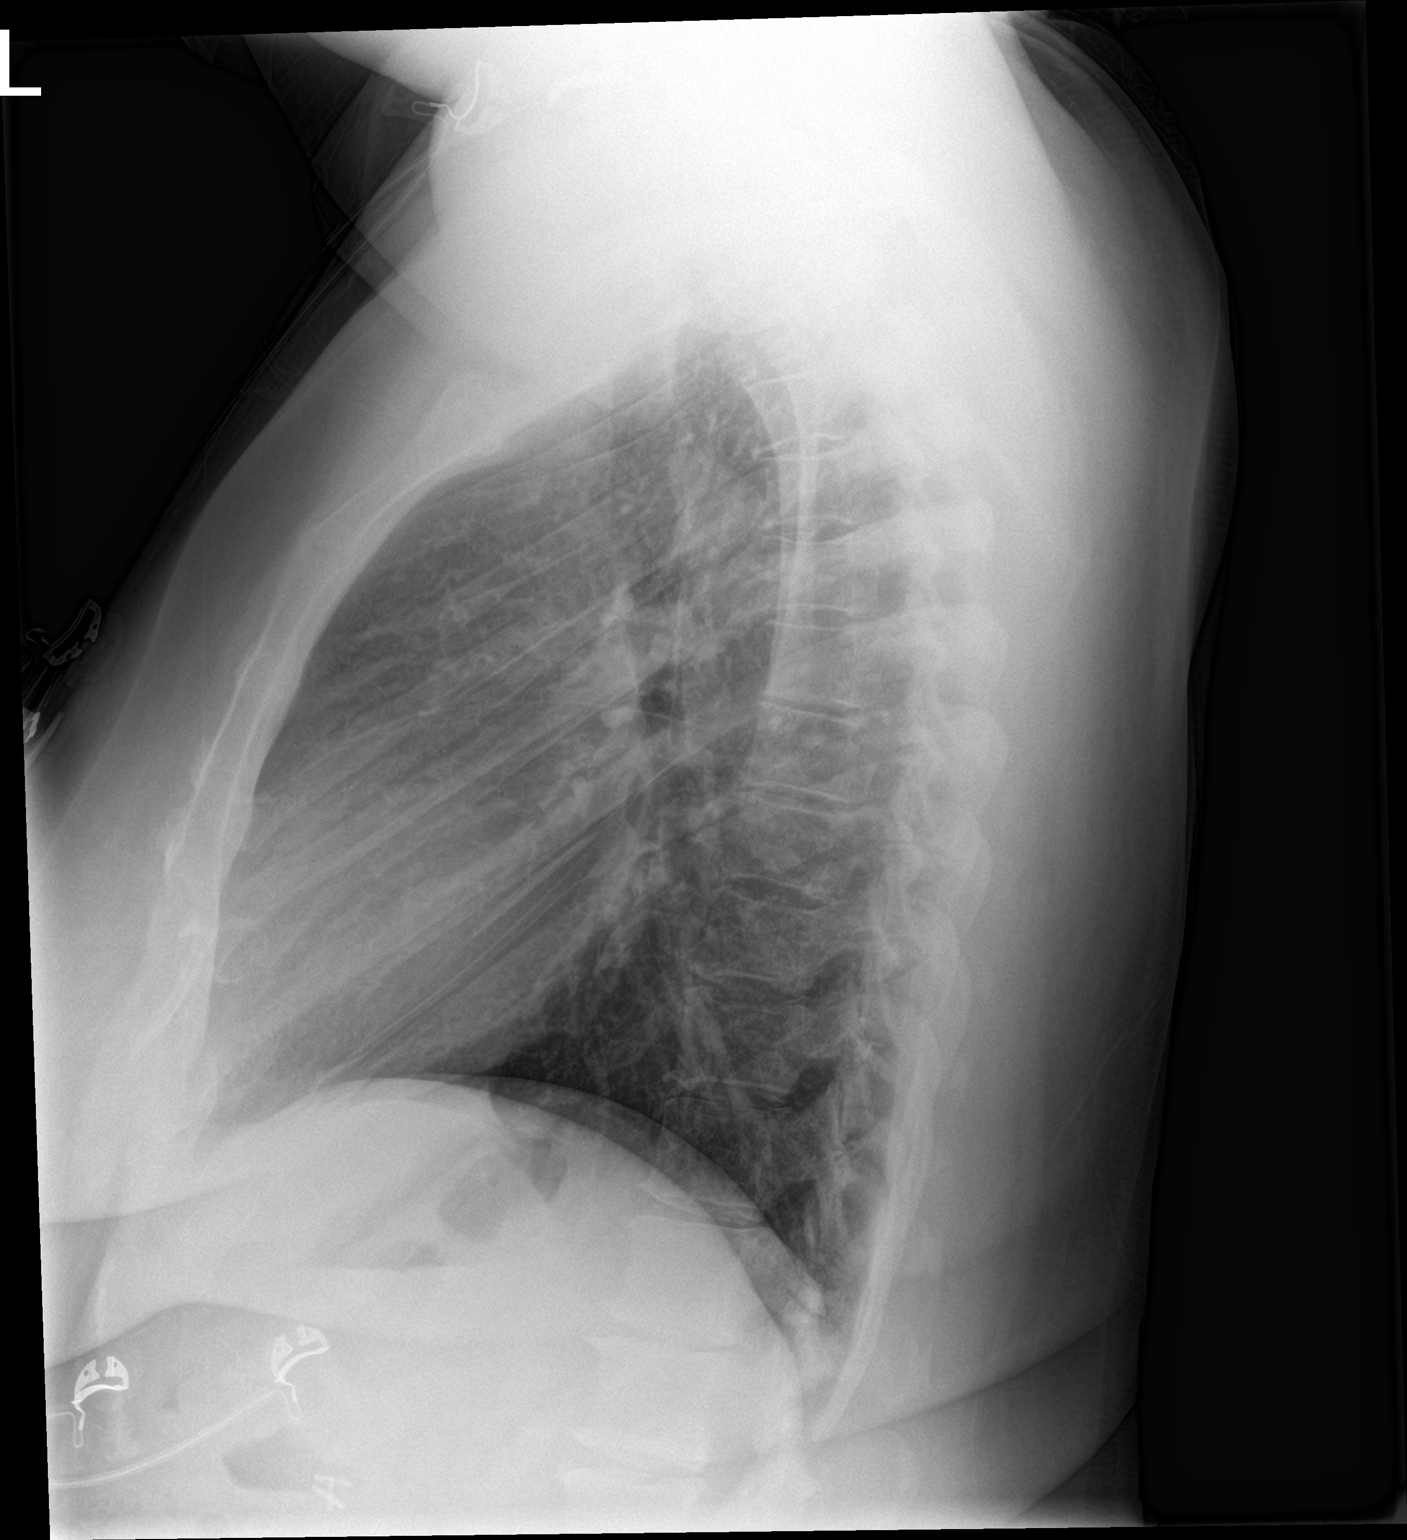

[2 of 2 positions shown; findings below may reference images not displayed]

FINDINGS: The heart size and mediastinal contours are within normal limits.
Both lungs are clear. No pneumothorax or pleural effusion is noted.
The visualized skeletal structures are unremarkable.
IMPRESSION: No active cardiopulmonary disease.

## 2018-04-29 MED ORDER — MEGESTROL ACETATE 40 MG PO TABS: ORAL_TABLET | ORAL | 3 refills | Status: DC

## 2018-04-29 NOTE — Telephone Encounter (Signed)
Tell her "sorry," I tried something lighter but I =guess it isn't enough  Stop prometrium  Restart megace, script was sent in

## 2018-04-29 NOTE — Telephone Encounter (Signed)
Pt states that she has been bleeding non stop for 3 weeks; She says the last 4-5 days have been dark blood. She states that she doesn't think the medication she was prescribed is working. She says she was told to stop the megace and was put on a different medication. Pt asks what she should do. Advised that I would send her request to a provider and let her know their recommendations.

## 2018-04-29 NOTE — Telephone Encounter (Signed)
Pt informed that rx for megace was sent to pharmacy. Advised to discontinue taking prometrium. Pt verbalized understanding.

## 2018-04-29 NOTE — Telephone Encounter (Signed)
Patient called, stated her cycle has been going on for three weeks and now she had a dark discharge. She's not sure if the medication he gave her is working.  She'd like to know what she needs to do.  [phone]

## 2018-07-04 ENCOUNTER — Telehealth: Payer: Self-pay | Admitting: Physician Assistant

## 2018-07-04 NOTE — Telephone Encounter (Signed)
   Primary Cardiologist:  Sanda Klein, MD   Patient contacted.  History reviewed.  No symptoms to suggest any unstable cardiac conditions.  Based on discussion, with current pandemic situation, we will be postponing this appointment for [name] with a plan for f/u in 8 weeks wks or sooner if feasible/necessary.  If symptoms change, she has been instructed to contact our office.   Routing to C19 CANCEL pool for tracking (P CV DIV CV19 CANCEL - reason for visit "other.") and assigning priority (2 = 6-12 wks).   Patient had a total of 2 episodes of chest pain, each chest pain only last seconds and does not correlate with exertion. Last episode of chest pain was 3 weeks ago. Recommend continue observation and followup in 6-12 weeks, earlier with evisit if recurrent chest discomfort. Patient is aware and appreciate the call. She understands she will contact us if chest discomfort recurs   Almyra Deforest, [place]  07/04/2018 3:45 PM         .

## 2018-07-09 ENCOUNTER — Ambulatory Visit: Payer: BLUE CROSS/BLUE SHIELD | Admitting: Physician Assistant

## 2018-08-02 ENCOUNTER — Other Ambulatory Visit: Payer: Self-pay | Admitting: Cardiovascular Disease

## 2018-08-04 NOTE — Telephone Encounter (Signed)
k-dur refilled. 

## 2018-09-03 ENCOUNTER — Other Ambulatory Visit: Payer: Self-pay | Admitting: Adult Health

## 2018-09-03 MED ORDER — VALACYCLOVIR HCL 500 MG PO TABS: 500.0000 mg | ORAL_TABLET | Freq: Every day | ORAL | 11 refills | Status: DC

## 2018-09-03 NOTE — Progress Notes (Signed)
Refilled valtrex  

## 2018-09-30 ENCOUNTER — Telehealth: Payer: Self-pay | Admitting: Obstetrics & Gynecology

## 2018-09-30 ENCOUNTER — Encounter (HOSPITAL_COMMUNITY): Payer: Self-pay

## 2018-09-30 ENCOUNTER — Other Ambulatory Visit: Payer: Self-pay

## 2018-09-30 ENCOUNTER — Emergency Department (HOSPITAL_COMMUNITY)
Admission: EM | Admit: 2018-09-30 | Discharge: 2018-09-30 | Disposition: A | Discharge status: Home / Self Care | Payer: BC Managed Care – PPO | Attending: Emergency Medicine | Admitting: Emergency Medicine

## 2018-09-30 DIAGNOSIS — N390 Urinary tract infection, site not specified: Secondary | ICD-10-CM | POA: Insufficient documentation

## 2018-09-30 DIAGNOSIS — Z79899 Other long term (current) drug therapy: Secondary | ICD-10-CM | POA: Diagnosis not present

## 2018-09-30 DIAGNOSIS — I10 Essential (primary) hypertension: Secondary | ICD-10-CM | POA: Diagnosis not present

## 2018-09-30 DIAGNOSIS — Z7982 Long term (current) use of aspirin: Secondary | ICD-10-CM | POA: Insufficient documentation

## 2018-09-30 LAB — URINALYSIS, ROUTINE W REFLEX MICROSCOPIC
Bilirubin Urine: NEGATIVE
Glucose, UA: NEGATIVE mg/dL
Ketones, ur: NEGATIVE mg/dL
Nitrite: NEGATIVE
Protein, ur: NEGATIVE mg/dL
Specific Gravity, Urine: 1.024 (ref 1.005–1.030)
pH: 5 (ref 5.0–8.0)

## 2018-09-30 LAB — PREGNANCY, URINE: Preg Test, Ur: NEGATIVE

## 2018-09-30 MED ORDER — CEPHALEXIN 500 MG PO CAPS: 500.0000 mg | ORAL_CAPSULE | Freq: Four times a day (QID) | ORAL | 0 refills | Status: AC

## 2018-09-30 NOTE — ED Triage Notes (Signed)
Pt believes she has  UTI. Has back pain and lower stomach pain. Pain started 3 days ago. For the last 2 weeks has had frequent urination.

## 2018-09-30 NOTE — Discharge Instructions (Signed)
Follow up with PCP on Friday.  Return to the ED for any new or worsening symptoms.

## 2018-09-30 NOTE — Telephone Encounter (Signed)
Pt went to [facility] ER and was diagnosed with UTI. Is on meds. [place]

## 2018-09-30 NOTE — ED Provider Notes (Signed)
[facility] EMERGENCY DEPARTMENT Provider Note   CSN: [id number] Arrival date & time: 09/30/18  1142   History   Chief Complaint Chief Complaint  Patient presents with  . Urinary Tract Infection    HPI Summer Carpenter is a 52 y.o. female with past medical history significant for GERD, hypertension, hyperlipidemia who presents for evaluation of urinary frequency.  Patient states she has had urinary frequency x10 days.  Developed suprapubic abdominal pain over the last 2 days.  Denies any fever, chills, nausea, vomiting, chest pain, shortness of breath, abdominal pain that radiates into her back, vaginal discharge, hematuria, flank pain, normal vaginal bleeding.  Patient denies concerns for STDs.  Patient states "I am just here to antibiotics for urinary tract infection."  Tolerating p.o. intake at home without difficulty.  Denies additional aggravating or alleviating factors.  She has not taken anything for symptoms at home.  Symptoms constant since onset.  States prior UTIs with similar symptoms.  History obtained from patient and past medical records.  No interpreter was used.     HPI  Past Medical History:  Diagnosis Date  . Anxiety   . GERD (gastroesophageal reflux disease)   . History of echocardiogram    a. 12/2012: EF 55-60%, no WMA, trivial TR  . Hyperlipidemia   . Hypertension   . Obesity   . Palpitations   . Prediabetes   . Sleep apnea     Patient Active Problem List   Diagnosis Date Noted  . Fecal occult blood test positive 02/26/2018  . Screening for colorectal cancer 02/26/2018  . Encounter for well woman exam with routine gynecological exam 02/26/2018  . DUB (dysfunctional uterine bleeding) 02/26/2018  . Normal coronary arteries 10/09/2016  . Chest pain at rest 10/09/2016  . Hypokalemia 08/28/2013  . Anemia 08/28/2013  . mild OSA (obstructive sleep apnea) 12/22/2012  . Fatigue 10/08/2012  . Snoring 10/08/2012  . Palpitations 09/14/2012  .  Essential hypertension 09/14/2012  . Hyperlipidemia 09/14/2012  . Obesity (BMI 35.0-39.9 without comorbidity) 09/14/2012    Past Surgical History:  Procedure Laterality Date  . CARDIAC SURGERY     catheterization in 2002  . CERVICAL POLYPECTOMY N/A 12/15/2014   Procedure: ENDOMETRIAL POLYPECTOMY;  Surgeon: Florian Buff, MD;  Location: [facility];  Service: Gynecology;  Laterality: N/A;  . CHOLECYSTECTOMY    . DILITATION & CURRETTAGE/HYSTROSCOPY WITH NOVASURE ABLATION N/A 12/15/2014   Procedure: DILATATION & CURETTAGE/HYSTEROSCOPY WITH ENDOMETRIAL NOVASURE ABLATION;  Surgeon: Florian Buff, MD;  Location: [facility];  Service: Gynecology;  Laterality: N/A;  . DOPPLER ECHOCARDIOGRAPHY  07/30/2000 [place]   EF 55-66%,lv normal  . NM MYOCAR PERF WALL MOTION  10/22/2011  . OOPHORECTOMY  2008   lt     OB History    Gravida  6   Para  3   Term  3   Preterm      AB  3   Living  3     SAB  3   TAB      Ectopic      Multiple      Live Births  3            Home Medications    Prior to Admission medications   Medication Sig Start Date End Date Taking? Authorizing Provider  acetaminophen (TYLENOL) 500 MG tablet Take 1,000 mg by mouth every 6 (six) hours as needed for mild pain or moderate pain.     [provider]  aspirin EC  81 MG tablet Take 81 mg by mouth daily.      [provider]  cephALEXin (KEFLEX) 500 MG capsule Take 1 capsule (500 mg total) by mouth 4 (four) times daily for 5 days. 09/30/18 10/05/18  Julea Hutto A, PA-C  cyclobenzaprine (FLEXERIL) 10 MG tablet Take 10 mg by mouth 3 (three) times daily as needed. For muscle spasms 03/27/18   [provider]  ferrous gluconate (FERGON) 324 MG tablet Take 324 mg by mouth daily. 03/28/18   [provider]  LINZESS 145 MCG CAPS capsule Take 145 mcg by mouth daily as needed (for constipation).  02/21/18   [provider]  losartan-hydrochlorothiazide (HYZAAR) 50-12.5 MG  tablet TAKE 1 TABLET BY MOUTH EVERY DAY Patient taking differently: Take 1 tablet by mouth daily.  01/14/18   Croitoru, Mihai, MD  megestrol (MEGACE) 40 MG tablet 3 tablets a day for 5 days, 2 tablets a day for 5 days then 1 tablet daily 04/29/18   Florian Buff, MD  naproxen (NAPROSYN) 500 MG tablet Take 500 mg by mouth 2 (two) times daily as needed. For pain 03/27/18   [provider]  omeprazole (PRILOSEC) 20 MG capsule Take 20 mg by mouth daily. 02/03/18   [provider]  potassium chloride (K-DUR) 10 MEQ tablet TAKE 1 TABLET BY MOUTH EVERY DAY 08/04/18   Croitoru, Mihai, MD  propranolol ER (INDERAL LA) 160 MG SR capsule Take 1 capsule (160 mg total) by mouth 2 (two) times daily. 03/15/17   Croitoru, Mihai, MD  sertraline (ZOLOFT) 100 MG tablet Take 100 mg by mouth every evening. 03/27/18   [provider]  simvastatin (ZOCOR) 10 MG tablet Take 10 mg by mouth at bedtime.     [provider]  sucralfate (CARAFATE) 1 G tablet Take 1 tablet by mouth every morning.  01/08/15   [provider]  valACYclovir (VALTREX) 500 MG tablet Take 1 tablet (500 mg total) by mouth daily. 09/03/18   Estill Dooms, NP  vitamin C (ASCORBIC ACID) 500 MG tablet Take 1 tablet by mouth every morning. 03/28/18   [provider]    Family History Family History  Problem Relation Age of Onset  . Hypertension Mother   . Heart attack Mother   . Seizures Father   . Hypertension Brother   . Hypertension Brother   . Hypertension Sister   . Early death Brother   . Hypertension Son     Social History Social History   Tobacco Use  . Smoking status: Never Smoker  . Smokeless tobacco: Never Used  Substance Use Topics  . Alcohol use: Yes    Comment: Occasionally, once a month  . Drug use: No     Allergies   Septra [bactrim] and Sulfamethoxazole-trimethoprim   Review of Systems Review of Systems  Constitutional: Negative.   HENT: Negative.    Respiratory: Negative.   Cardiovascular: Negative.   Gastrointestinal: Negative.   Genitourinary: Positive for dysuria. Negative for decreased urine volume, difficulty urinating, enuresis, frequency, genital sores, hematuria, menstrual problem, pelvic pain, urgency, vaginal bleeding, vaginal discharge and vaginal pain.  Musculoskeletal: Negative.   Skin: Negative.   Neurological: Negative.   All other systems reviewed and are negative.   Physical Exam Updated Vital Signs BP (!) 132/96 (BP Location: Left Arm)   Pulse 76   Temp 98.3 F (36.8 C) (Oral)   Resp 16   Ht 5\' 7"  (1.702 m)   Wt 107 kg   LMP  09/19/2018   SpO2 100%   BMI 36.96 kg/m   Physical Exam Vitals signs and nursing note reviewed.  Constitutional:      General: She is not in acute distress.    Appearance: She is well-developed. She is not ill-appearing, toxic-appearing or diaphoretic.  HENT:     Head: Atraumatic.  Eyes:     Pupils: Pupils are equal, round, and reactive to light.  Neck:     Musculoskeletal: Normal range of motion.  Cardiovascular:     Rate and Rhythm: Normal rate.     Pulses: Normal pulses.          Radial pulses are 2+ on the right side and 2+ on the left side.       Posterior tibial pulses are 2+ on the right side and 2+ on the left side.     Heart sounds: Normal heart sounds.  Pulmonary:     Effort: Pulmonary effort is normal. No respiratory distress.     Breath sounds: Normal breath sounds and air entry. No decreased breath sounds, rhonchi or rales.  Abdominal:     General: Bowel sounds are normal. There is no distension.     Palpations: Abdomen is soft. There is no shifting dullness, mass or pulsatile mass.     Tenderness: There is no abdominal tenderness. There is no right CVA tenderness, left CVA tenderness, guarding or rebound. Negative signs include Murphy's sign and McBurney's sign.     Hernia: No hernia is present.     Comments: Soft, Nontender without rebound or guarding.   Negative CVA tap bilaterally.  No overlying skin changes to abdominal wall.  No abdominal wall herniations.  Musculoskeletal: Normal range of motion.     Comments: Negative CVA tap bilaterally.  No midline backpain. Full range of motion of the T-spine and L-spine with flexion, hyperextension, and lateral flexion. No midline tenderness or stepoffs. No tenderness to palpation of the spinous processes of the T-spine or L-spine. No tenderness to palpation of the paraspinous muscles of the L-spine.negative straight leg raise.   Skin:    General: Skin is warm and dry.  Neurological:     Mental Status: She is alert.     Comments: Speech is clear and goal oriented, follows commands Normal 5/5 strength in upper and lower extremities bilaterally including dorsiflexion and plantar flexion, strong and equal grip strength Sensation normal to light and sharp touch Moves extremities without ataxia, coordination intact Normal gait Normal balance No Clonus    ED Treatments / Results  Labs (all labs ordered are listed, but only abnormal results are displayed) Labs Reviewed  URINALYSIS, ROUTINE W REFLEX MICROSCOPIC - Abnormal; Notable for the following components:      Result Value   APPearance HAZY (*)    Hgb urine dipstick SMALL (*)    Leukocytes,Ua MODERATE (*)    Bacteria, UA RARE (*)    All other components within normal limits  PREGNANCY, URINE    EKG None  Radiology No results found.  Procedures Procedures (including critical care time)  Medications Ordered in ED Medications - No data to display  Initial Impression / Assessment and Plan / ED Course  I have reviewed the triage vital signs and the nursing notes.  Pertinent labs & imaging results that were available during my care of the patient were reviewed by me and considered in my medical decision making (see chart for details).  53 year old female appears otherwise well presents for evaluation of dysuria x10 days  as well as  some suprapubic discomfort which began over the last few days.  Afebrile, nonseptic, non-ill-appearing.  Patient states symptoms similar to previous urinary tract infections.  Denies any hematuria, pelvic pain or vaginal discharge.  Abdomen soft, nontender without rebound or guarding.  Negative CVA tap bilaterally.  Lumbar back pain.  No red flags for back pain.  Patient refusing blood work or imaging at this time.  Patient states this feels similar to her previous urinary tract infections and she just "needs antibiotics."  Patient states that follow-up appointment PCP on Friday.  Discussed with patient we cannot rule out electrolyte, additional [facility] pathology including pyelonephritis, dissection, bowel obstruction/perforation, appendicitis, uterine abnormality, infectious process.  Patient voices understanding however continues to decline labs or imaging at this time.  Urinalysis ordered from triage positive for leukocytes and bacteria.  Nitrite negative.  Will culture.  Pregnancy test negative.   Patient is nontoxic, nonseptic appearing, in no apparent distress On repeat exam patient does not have a surgical abdomin and there are no peritoneal signs.  No indication of appendicitis, bowel obstruction, bowel perforation, cholecystitis, diverticulitis, PID or ectopic pregnancy.  No red flags for back pain. Low patient for discitis, osteomyelitis, cauda equina, transverse myelitis, dissection.  Will DC patient home with Keflex for possible UTI.  Discussed with patient follow-up with PCP which he has an appointment for on Friday.  She is tolerating p.o. intake in ED without difficulty.  Ambulating in ED without difficulty.  The patient has been appropriately medically screened and/or stabilized in the ED. I have low suspicion for any other emergent medical condition which would require further screening, evaluation or treatment in the ED or require inpatient management.  Patient is hemodynamically stable and in  no acute distress.  Patient able to ambulate in department prior to ED.  Evaluation does not show acute pathology that would require ongoing or additional emergent interventions while in the emergency department or further inpatient treatment.  I have discussed the diagnosis with the patient and answered all questions.  Paatient has no further complaints prior to discharge.  Patient is comfortable with plan discussed in room and is stable for discharge at this time.  I have discussed strict return precautions for returning to the emergency department.  Patient was encouraged to follow-up with PCP/specialist refer to at discharge.       Final Clinical Impressions(s) / ED Diagnoses   Final diagnoses:  Lower urinary tract infectious disease    ED Discharge Orders         Ordered    cephALEXin (KEFLEX) 500 MG capsule  4 times daily     09/30/18 1410           Marli Diego A, PA-C 09/30/18 1656    Fredia Sorrow, MD 10/05/18 1643

## 2018-09-30 NOTE — ED Notes (Signed)
See triage notes. Hold on blood work until seen by PA due to may not need, PA aware. Pt states she would rather not have blood work but would if needed to

## 2018-09-30 NOTE — Telephone Encounter (Signed)
Patient called, thinks she has a UTI x 1 week.  Would like an appointment, please advise.  [place], [place]  [phone]

## 2018-10-06 ENCOUNTER — Telehealth: Payer: Self-pay | Admitting: Cardiovascular Disease

## 2018-10-06 NOTE — Telephone Encounter (Signed)
New message   STAT if HR is under 50 or over 120 (normal HR is 60-100 beats per minute)  1) What is your heart rate? 85-90 per minute, patient states that hr is usually 65 at rest   2) Do you have a log of your heart rate readings (document readings)? Yes   3) Do you have any other symptoms?no

## 2018-10-06 NOTE — Telephone Encounter (Signed)
Follow up: ° ° ° °Patient returning your call back.  °

## 2018-10-06 NOTE — Telephone Encounter (Signed)
Infection, dehydration and anxiety are all possible reasons for a faster average heart rate, via increased adrenaline levels.  Her current heart rate is not in a concerning range. Make sure she is drinking plenty of fluids and try to find ways to relieve stress, but we do not need to change meds.

## 2018-10-06 NOTE — Telephone Encounter (Signed)
Follow up ° ° °Patient is returning your call. Please call. ° ° ° °

## 2018-10-06 NOTE — Telephone Encounter (Signed)
Left a message for the patient to call back.  

## 2018-10-06 NOTE — Telephone Encounter (Signed)
Spoke with pt who states that she has noticed that her HR has ranged from 80-90 over the last month and baseline is 65-74. 11:02am today BP 123/76 and HR 84. Pt on propranolol 160 mg BID, K-Dur 10 MEq daily, Hyzaar 50-12.5 mg  Daily. She states on propranolol for palpitations. She states she is asymptomatic; just concerned about elevated HR. Reviewed possible causes of elevated HR. Pt does state that she has been under stress recently and does state that she has recent UTI and was put on cephalexin 500 mg QID for 5 days. ED visit 6/23. Will route to Dr. Sallyanne Kuster for review

## 2018-10-07 NOTE — Telephone Encounter (Addendum)
The patient has been made aware of Dr. Victorino December recommendations and has verbalized her understanding.

## 2018-10-21 ENCOUNTER — Other Ambulatory Visit (HOSPITAL_COMMUNITY): Payer: Self-pay | Admitting: Obstetrics and Gynecology

## 2018-10-21 DIAGNOSIS — Z1231 Encounter for screening mammogram for malignant neoplasm of breast: Secondary | ICD-10-CM

## 2018-10-29 ENCOUNTER — Telehealth: Payer: Self-pay | Admitting: Obstetrics & Gynecology

## 2018-10-29 NOTE — Telephone Encounter (Signed)
Pt requesting an appt for a UTI and odor. States she was seen in the ER.

## 2018-10-29 NOTE — Telephone Encounter (Signed)
Left message @ 4:12 pm. JSY

## 2018-10-29 NOTE — Telephone Encounter (Signed)
Pt was seen at ER for UTI and was prescribed Cephalexin. Pt took all of med. She feels like she got some better but still having low back pain and frequent urination. Pt has also an odor. Pt running a low grade temp. Has had a negative Covid test. Advised she needs to be seen. Call transferred to front desk for appt. Summer Carpenter

## 2018-10-30 ENCOUNTER — Ambulatory Visit: Payer: BC Managed Care – PPO | Admitting: Adult Health

## 2018-10-30 ENCOUNTER — Other Ambulatory Visit: Payer: Self-pay

## 2018-10-30 ENCOUNTER — Encounter: Payer: Self-pay | Admitting: Advanced Practice Midwife

## 2018-10-30 ENCOUNTER — Ambulatory Visit (INDEPENDENT_AMBULATORY_CARE_PROVIDER_SITE_OTHER): Payer: BC Managed Care – PPO | Admitting: Advanced Practice Midwife

## 2018-10-30 VITALS — BP 123/79 | HR 76 | Ht 67.0 in | Wt 240.0 lb

## 2018-10-30 DIAGNOSIS — R350 Frequency of micturition: Secondary | ICD-10-CM

## 2018-10-30 DIAGNOSIS — N898 Other specified noninflammatory disorders of vagina: Secondary | ICD-10-CM

## 2018-10-30 DIAGNOSIS — N760 Acute vaginitis: Secondary | ICD-10-CM | POA: Diagnosis not present

## 2018-10-30 DIAGNOSIS — B9689 Other specified bacterial agents as the cause of diseases classified elsewhere: Secondary | ICD-10-CM | POA: Diagnosis not present

## 2018-10-30 LAB — POCT URINALYSIS DIPSTICK
Blood, UA: NEGATIVE
Glucose, UA: NEGATIVE
Ketones, UA: NEGATIVE
Leukocytes, UA: NEGATIVE
Nitrite, UA: NEGATIVE
Protein, UA: NEGATIVE

## 2018-10-30 MED ORDER — METRONIDAZOLE 500 MG PO TABS: 500.0000 mg | ORAL_TABLET | Freq: Two times a day (BID) | ORAL | 0 refills | Status: DC

## 2018-10-30 MED ORDER — FLUCONAZOLE 150 MG PO TABS: 150.0000 mg | ORAL_TABLET | Freq: Once | ORAL | 0 refills | Status: AC

## 2018-10-30 NOTE — Progress Notes (Signed)
[facility] Visit  Patient name: Summer Carpenter MRN [id number]  Date of birth: Feb 14, 1966  CC & HPI:  Summer Carpenter is a 53 y.o.  female presenting today for some odor and urinary frequency. Seen in ED a month ago given keflex for ? UTI, cx not done. Sx of vaginal odor still persist, voids a lot but is normal amounts  LBP, as well   Pertinent History Reviewed:  Medical & Surgical Hx:   Past Medical History:  Diagnosis Date  . Anxiety   . GERD (gastroesophageal reflux disease)   . History of echocardiogram    a. 12/2012: EF 55-60%, no WMA, trivial TR  . Hyperlipidemia   . Hypertension   . Obesity   . Palpitations   . Prediabetes   . Sleep apnea    Past Surgical History:  Procedure Laterality Date  . CARDIAC SURGERY     catheterization in 2002  . CERVICAL POLYPECTOMY N/A 12/15/2014   Procedure: ENDOMETRIAL POLYPECTOMY;  Surgeon: Florian Buff, MD;  Location: [facility];  Service: Gynecology;  Laterality: N/A;  . CHOLECYSTECTOMY    . DILITATION & CURRETTAGE/HYSTROSCOPY WITH NOVASURE ABLATION N/A 12/15/2014   Procedure: DILATATION & CURETTAGE/HYSTEROSCOPY WITH ENDOMETRIAL NOVASURE ABLATION;  Surgeon: Florian Buff, MD;  Location: [facility];  Service: Gynecology;  Laterality: N/A;  . DOPPLER ECHOCARDIOGRAPHY  07/30/2000 [place]   EF 55-66%,lv normal  . NM MYOCAR PERF WALL MOTION  10/22/2011  . OOPHORECTOMY  2008   lt   Family History  Problem Relation Age of Onset  . Hypertension Mother   . Heart attack Mother   . Seizures Father   . Hypertension Brother   . Hypertension Brother   . Hypertension Sister   . Early death Brother   . Hypertension Son     Current Outpatient Medications:  .  acetaminophen (TYLENOL) 500 MG tablet, Take 1,000 mg by mouth every 6 (six) hours as needed for mild pain or moderate pain. , Disp: , Rfl:  .  aspirin EC 81 MG tablet, Take 81 mg by mouth daily.  , Disp: , Rfl:  .  cyclobenzaprine (FLEXERIL) 10 MG tablet, Take 10 mg by  mouth 3 (three) times daily as needed. For muscle spasms, Disp: , Rfl:  .  ferrous gluconate (FERGON) 324 MG tablet, Take 324 mg by mouth daily., Disp: , Rfl:  .  LINZESS 145 MCG CAPS capsule, Take 145 mcg by mouth daily as needed (for constipation). , Disp: , Rfl: 2 .  losartan-hydrochlorothiazide (HYZAAR) 50-12.5 MG tablet, TAKE 1 TABLET BY MOUTH EVERY DAY (Patient taking differently: Take 1 tablet by mouth daily. ), Disp: 90 tablet, Rfl: 3 .  megestrol (MEGACE) 40 MG tablet, 3 tablets a day for 5 days, 2 tablets a day for 5 days then 1 tablet daily, Disp: 45 tablet, Rfl: 3 .  metroNIDAZOLE (FLAGYL) 500 MG tablet, Take 1 tablet (500 mg total) by mouth 2 (two) times daily., Disp: 14 tablet, Rfl: 0 .  naproxen (NAPROSYN) 500 MG tablet, Take 500 mg by mouth 2 (two) times daily as needed. For pain, Disp: , Rfl:  .  omeprazole (PRILOSEC) 20 MG capsule, Take 20 mg by mouth daily., Disp: , Rfl: 0 .  potassium chloride (K-DUR) 10 MEQ tablet, TAKE 1 TABLET BY MOUTH EVERY DAY, Disp: 90 tablet, Rfl: 0 .  propranolol ER (INDERAL LA) 160 MG SR capsule, Take 1 capsule (160 mg total) by mouth 2 (two) times daily., Disp: 180 capsule,  Rfl: 3 .  sertraline (ZOLOFT) 100 MG tablet, Take 100 mg by mouth every evening., Disp: , Rfl:  .  simvastatin (ZOCOR) 10 MG tablet, Take 10 mg by mouth at bedtime. , Disp: , Rfl:  .  sucralfate (CARAFATE) 1 G tablet, Take 1 tablet by mouth every morning. , Disp: , Rfl: 0 .  valACYclovir (VALTREX) 500 MG tablet, Take 1 tablet (500 mg total) by mouth daily., Disp: 30 tablet, Rfl: 11 .  vitamin C (ASCORBIC ACID) 500 MG tablet, Take 1 tablet by mouth every morning., Disp: , Rfl:  Social History: Reviewed -  reports that she has never smoked. She has never used smokeless tobacco.  Review of Systems:   Constitutional: Negative for fever and chills Eyes: Negative for visual disturbances Respiratory: Negative for shortness of breath, dyspnea Cardiovascular: Negative for chest pain or  palpitations  Gastrointestinal: Negative for vomiting, diarrhea and constipation; no abdominal pain Genitourinary: Negative for dysuria and urgency, vaginal irritation or itching Musculoskeletal: Negative for back pain, joint pain, myalgias  Neurological: Negative for dizziness and headaches    Objective Findings:    Physical Examination: Vitals:   10/30/18 1526  BP: 123/79  Pulse: 76   General appearance - well appearing, and in no distress Mental status - alert, oriented to person, place, and time Chest:  Normal respiratory effort Heart - normal rate and regular rhythm Abdomen:  Soft, nontender Pelvic: SSE:  Small amount of yellow discharge, no discernable odor. Wet prep has a few clue, neg everything else Musculoskeletal:  Normal range of motion without pain. Back pain is in lower lumbar region, no CVAT Extremities:  No edema    Results for orders placed or performed in visit on 10/30/18 (from the past 24 hour(s))  POCT Urinalysis Dipstick   Collection Time: 10/30/18  3:28 PM  Result Value Ref Range   Color, UA     Clarity, UA     Glucose, UA Negative Negative   Bilirubin, UA     Ketones, UA neg    Spec Grav, UA     Blood, UA neg    pH, UA     Protein, UA Negative Negative   Urobilinogen, UA     Nitrite, UA neg    Leukocytes, UA Negative Negative   Appearance     Odor        Assessment & Plan:  A:   BV P:  Check nuswab , culture urine   Requested diflucan refill   Return for If you have any problems.  Christin Fudge CNM 10/30/2018 4:04 PM

## 2018-11-01 LAB — SPECIMEN STATUS REPORT

## 2018-11-01 LAB — URINE CULTURE

## 2018-11-06 ENCOUNTER — Encounter: Payer: Self-pay | Admitting: Advanced Practice Midwife

## 2018-11-07 LAB — NUSWAB VAGINITIS PLUS (VG+)
Atopobium vaginae: HIGH Score — AB
Candida albicans, NAA: NEGATIVE
Candida glabrata, NAA: NEGATIVE
Chlamydia trachomatis, NAA: NEGATIVE
Megasphaera 1: HIGH Score — AB
Neisseria gonorrhoeae, NAA: NEGATIVE
Trich vag by NAA: NEGATIVE

## 2018-11-07 LAB — SPECIMEN STATUS REPORT

## 2018-11-18 ENCOUNTER — Encounter: Payer: Self-pay | Admitting: Cardiovascular Disease

## 2018-11-18 ENCOUNTER — Other Ambulatory Visit: Payer: Self-pay

## 2018-11-18 ENCOUNTER — Ambulatory Visit (INDEPENDENT_AMBULATORY_CARE_PROVIDER_SITE_OTHER): Payer: BC Managed Care – PPO | Admitting: Cardiovascular Disease

## 2018-11-18 VITALS — BP 131/81 | HR 69 | Temp 98.2°F | Ht 67.0 in | Wt 242.4 lb

## 2018-11-18 DIAGNOSIS — Z6837 Body mass index (BMI) 37.0-37.9, adult: Secondary | ICD-10-CM

## 2018-11-18 DIAGNOSIS — I10 Essential (primary) hypertension: Secondary | ICD-10-CM

## 2018-11-18 DIAGNOSIS — E876 Hypokalemia: Secondary | ICD-10-CM

## 2018-11-18 DIAGNOSIS — G4733 Obstructive sleep apnea (adult) (pediatric): Secondary | ICD-10-CM

## 2018-11-18 DIAGNOSIS — R002 Palpitations: Secondary | ICD-10-CM

## 2018-11-18 NOTE — Patient Instructions (Addendum)
Medication Instructions:  Your physician recommends that you continue on your current medications as directed. Please refer to the Current Medication list given to you today.  If you need a refill on your cardiac medications before your next appointment, please call your pharmacy.   Lab work: None ordered If you have labs (blood work) drawn today and your tests are completely normal, you will receive your results only by: [place] (if you have MyChart) OR A paper copy in the mail If you have any lab test that is abnormal or we need to change your treatment, we will call you to review the results.  Testing/Procedures: None ordered  Follow-Up: At [facility], you and your health needs are our priority.  As part of our continuing mission to provide you with exceptional heart care, we have created designated Provider Care Teams.  These Care Teams include your primary Cardiologist (physician) and Advanced Practice Providers (APPs -  Physician Assistants and Nurse Practitioners) who all work together to provide you with the care you need, when you need it. You will need a follow up appointment in 12 months.  Please call our office 2 months in advance to schedule this appointment.  You may see Sanda Klein, MD or one of the following Advanced Practice Providers on your designated Care Team: Almyra Deforest, [place] Fabian Sharp, [place]   Please send a MyChart message with the name and dose of the as needed medication for palpitations.

## 2018-11-18 NOTE — Progress Notes (Signed)
[name], female   DOB: 1966-03-21, 53 y.o.   MRN: [id number]    Cardiology Office Note    Date:  11/20/2018   ID:  Summer Carpenter, DOB 1966-03-28, MRN [id number]  PCP:  [facility]  Cardiologist:   Sanda Klein, MD   Chief complaint: palpitations and HBP follow up   History of Present Illness:  Summer Carpenter is a 53 y.o. female with HTN and palpitations, returning for follow up.   She is done quite well since her last appointment without any serious health issues.  She has an appointment to draw labs for her PCP with [facility] tomorrow.    Palpitations bother her very infrequently, none recently.  Her blood pressure has been well controlled.  The patient specifically denies any chest pain at rest exertion, dyspnea at rest or with exertion, orthopnea, paroxysmal nocturnal dyspnea, syncope, palpitations, focal neurological deficits, intermittent claudication, lower extremity edema, unexplained weight gain, cough, hemoptysis or wheezing.  Previous monitoring has not shown any evidence of serious arrhythmia. She has normal left ventricular size, wall thickness, systolic and diastolic function and no valvular abnormalities by echocardiography performed in September 2014. She is compliant with a statin for hyperlipidemia. Nebivolol was not as effective at symptom control.  Coronary disease by remote cardiac catheterization in 2003 and had no ischemic defects on the nuclear stress test in 2013.  Past Medical History:  Diagnosis Date  . Anxiety   . GERD (gastroesophageal reflux disease)   . History of echocardiogram    a. 12/2012: EF 55-60%, no WMA, trivial TR  . Hyperlipidemia   . Hypertension   . Obesity   . Palpitations   . Prediabetes   . Sleep apnea     Past Surgical History:  Procedure Laterality Date  . CARDIAC SURGERY     catheterization in 2002  . CERVICAL POLYPECTOMY N/A 12/15/2014   Procedure:  ENDOMETRIAL POLYPECTOMY;  Surgeon: Florian Buff, MD;  Location: [facility];  Service: Gynecology;  Laterality: N/A;  . CHOLECYSTECTOMY    . DILITATION & CURRETTAGE/HYSTROSCOPY WITH NOVASURE ABLATION N/A 12/15/2014   Procedure: DILATATION & CURETTAGE/HYSTEROSCOPY WITH ENDOMETRIAL NOVASURE ABLATION;  Surgeon: Florian Buff, MD;  Location: [facility];  Service: Gynecology;  Laterality: N/A;  . DOPPLER ECHOCARDIOGRAPHY  07/30/2000 [place]   EF 55-66%,lv normal  . NM MYOCAR PERF WALL MOTION  10/22/2011  . OOPHORECTOMY  2008   lt    Current Outpatient Medications  Medication Sig Dispense Refill  . acetaminophen (TYLENOL) 500 MG tablet Take 1,000 mg by mouth every 6 (six) hours as needed for mild pain or moderate pain.     Marland Kitchen aspirin EC 81 MG tablet Take 81 mg by mouth daily.      . ferrous gluconate (FERGON) 324 MG tablet Take 324 mg by mouth daily.    Marland Kitchen LINZESS 145 MCG CAPS capsule Take 145 mcg by mouth daily as needed (for constipation).   2  . losartan-hydrochlorothiazide (HYZAAR) 50-12.5 MG tablet TAKE 1 TABLET BY MOUTH EVERY DAY (Patient taking differently: Take 1 tablet by mouth daily. ) 90 tablet 3  . megestrol (MEGACE) 40 MG tablet 3 tablets a day for 5 days, 2 tablets a day for 5 days then 1 tablet daily 45 tablet 3  . metroNIDAZOLE (FLAGYL) 500 MG tablet Take 1 tablet (500 mg total) by mouth 2 (two) times daily. 14 tablet 0  . naproxen (NAPROSYN) 500 MG tablet Take 500  mg by mouth 2 (two) times daily as needed. For pain    . omeprazole (PRILOSEC) 20 MG capsule Take 20 mg by mouth daily.  0  . potassium chloride (K-DUR) 10 MEQ tablet TAKE 1 TABLET BY MOUTH EVERY DAY 90 tablet 0  . propranolol ER (INDERAL LA) 160 MG SR capsule Take 1 capsule (160 mg total) by mouth 2 (two) times daily. 180 capsule 3  . sertraline (ZOLOFT) 100 MG tablet Take 100 mg by mouth every evening.    . simvastatin (ZOCOR) 10 MG tablet Take 10 mg by mouth at bedtime.     . sucralfate (CARAFATE) 1 G tablet Take 1 tablet by  mouth every morning.   0  . valACYclovir (VALTREX) 500 MG tablet Take 1 tablet (500 mg total) by mouth daily. 30 tablet 11  . vitamin C (ASCORBIC ACID) 500 MG tablet Take 1 tablet by mouth every morning.     No current facility-administered medications for this visit.     Allergies:   Septra [bactrim] and Sulfamethoxazole-trimethoprim   Social History   Socioeconomic History  . Marital status: Single    Spouse name: Not on file  . Number of children: Not on file  . Years of education: Not on file  . Highest education level: Not on file  Occupational History  . Not on file  Social Needs  . Financial resource strain: Not on file  . Food insecurity    Worry: Not on file    Inability: Not on file  . Transportation needs    Medical: Not on file    Non-medical: Not on file  Tobacco Use  . Smoking status: Never Smoker  . Smokeless tobacco: Never Used  Substance and Sexual Activity  . Alcohol use: Yes    Comment: Occasionally, once a month  . Drug use: No  . Sexual activity: Yes    Birth control/protection: None  Lifestyle  . Physical activity    Days per week: Not on file    Minutes per session: Not on file  . Stress: Not on file  Relationships  . Social Herbalist on phone: Not on file    Gets together: Not on file    Attends religious service: Not on file    Active member of club or organization: Not on file    Attends meetings of clubs or organizations: Not on file    Relationship status: Not on file  Other Topics Concern  . Not on file  Social History Narrative  . Not on file     Family History:  The patient's family history includes Early death in her brother; Heart attack in her mother; Hypertension in her brother, brother, mother, sister, and son; Seizures in her father.   ROS:   Please see the history of present illness.    Review of Systems  All other systems reviewed and are negative. All other systems are reviewed and are negative  PHYSICAL  EXAM:   VS:  BP 131/81   Pulse 69   Temp 98.2 F (36.8 C)   Ht 5\' 7"  (1.702 m)   Wt 242 lb 6.4 oz (110 kg)   SpO2 100%   BMI 37.97 kg/m     General: Alert, oriented x3, no distress, obese Head: no evidence of trauma, PERRL, EOMI, no exophtalmos or lid lag, no myxedema, no xanthelasma; normal ears, nose and oropharynx Neck: normal jugular venous pulsations and no hepatojugular reflux; brisk carotid pulses without  delay and no carotid bruits Chest: clear to auscultation, no signs of consolidation by percussion or palpation, normal fremitus, symmetrical and full respiratory excursions Cardiovascular: normal position and quality of the apical impulse, regular rhythm, normal first and second heart sounds, no murmurs, rubs or gallops Abdomen: no tenderness or distention, no masses by palpation, no abnormal pulsatility or arterial bruits, normal bowel sounds, no hepatosplenomegaly Extremities: no clubbing, cyanosis or edema; 2+ radial, ulnar and brachial pulses bilaterally; 2+ right femoral, posterior tibial and dorsalis pedis pulses; 2+ left femoral, posterior tibial and dorsalis pedis pulses; no subclavian or femoral bruits Neurological: grossly nonfocal Psych: Normal mood and affect   Wt Readings from Last 3 Encounters:  11/18/18 242 lb 6.4 oz (110 kg)  10/30/18 240 lb (108.9 kg)  09/30/18 236 lb (107 kg)      Studies/Labs Reviewed:   EKG:  EKG is ordered today. It shows NSR,  incomplete RBBB, QRS 100 ms, normal repol, QTc 435 ms.  Recent Labs: 01/22/2018: ALT 14 04/22/2018: BUN 13; Creatinine, Ser 1.16; Hemoglobin 11.7; Platelets 380; Potassium 3.1; Sodium 136    ASSESSMENT:    1. Essential hypertension      PLAN:  In order of problems listed above:  1. HTN: Well-controlled was difficult to control without thiazide diuretics. 2. Hypokalemia: Often precipitates palpitations.  Labs tomorrow 3. HLP: On statin.  Labs will be checked tomorrow by PCP.  Will request a copy 4.  Obesity: Severely obese.  Discussed ways to lose weight. 5. OSA: Mild, not meeting criteria for CPAP.  Does not have daytime hypersomnolence.   Medication Adjustments/Labs and Tests Ordered: Current medicines are reviewed at length with the patient today.  Concerns regarding medicines are outlined above.  Medication changes, Labs and Tests ordered today are listed below. Patient Instructions  Medication Instructions:  Your physician recommends that you continue on your current medications as directed. Please refer to the Current Medication list given to you today.  If you need a refill on your cardiac medications before your next appointment, please call your pharmacy.   Lab work: None ordered If you have labs (blood work) drawn today and your tests are completely normal, you will receive your results only by: [place] (if you have MyChart) OR A paper copy in the mail If you have any lab test that is abnormal or we need to change your treatment, we will call you to review the results.  Testing/Procedures: None ordered  Follow-Up: At [facility], you and your health needs are our priority.  As part of our continuing mission to provide you with exceptional heart care, we have created designated Provider Care Teams.  These Care Teams include your primary Cardiologist (physician) and Advanced Practice Providers (APPs -  Physician Assistants and Nurse Practitioners) who all work together to provide you with the care you need, when you need it. You will need a follow up appointment in 12 months.  Please call our office 2 months in advance to schedule this appointment.  You may see Sanda Klein, MD or one of the following Advanced Practice Providers on your designated Care Team: Almyra Deforest, [place] Fabian Sharp, [place]   Please send a MyChart message with the name and dose of the as needed medication for palpitations.      Signed, Sanda Klein, MD  11/20/2018 8:39 AM    [facility] [place], [place], [place]  [address] Phone: [phone]; Fax: [phone]

## 2018-11-20 ENCOUNTER — Encounter: Payer: Self-pay | Admitting: Cardiovascular Disease

## 2018-11-24 ENCOUNTER — Other Ambulatory Visit: Payer: Self-pay | Admitting: *Deleted

## 2018-12-01 ENCOUNTER — Encounter (HOSPITAL_COMMUNITY): Payer: Self-pay

## 2018-12-01 ENCOUNTER — Ambulatory Visit (HOSPITAL_COMMUNITY)
Admission: RE | Admit: 2018-12-01 | Discharge: 2018-12-01 | Disposition: A | Discharge status: Home / Self Care | Payer: BC Managed Care – PPO | Source: Ambulatory Visit | Attending: Obstetrics and Gynecology | Admitting: Obstetrics and Gynecology

## 2018-12-01 ENCOUNTER — Other Ambulatory Visit: Payer: Self-pay

## 2018-12-01 DIAGNOSIS — Z1231 Encounter for screening mammogram for malignant neoplasm of breast: Secondary | ICD-10-CM | POA: Insufficient documentation

## 2018-12-03 ENCOUNTER — Other Ambulatory Visit: Payer: Self-pay | Admitting: Cardiovascular Disease

## 2019-01-09 ENCOUNTER — Emergency Department (HOSPITAL_COMMUNITY)
Admission: EM | Admit: 2019-01-09 | Discharge: 2019-01-09 | Disposition: A | Discharge status: Home / Self Care | Payer: BC Managed Care – PPO | Attending: Emergency Medicine | Admitting: Emergency Medicine

## 2019-01-09 ENCOUNTER — Other Ambulatory Visit: Payer: Self-pay

## 2019-01-09 ENCOUNTER — Encounter (HOSPITAL_COMMUNITY): Payer: Self-pay | Admitting: Emergency Medicine

## 2019-01-09 DIAGNOSIS — Z79899 Other long term (current) drug therapy: Secondary | ICD-10-CM | POA: Diagnosis not present

## 2019-01-09 DIAGNOSIS — Z7982 Long term (current) use of aspirin: Secondary | ICD-10-CM | POA: Insufficient documentation

## 2019-01-09 DIAGNOSIS — J3489 Other specified disorders of nose and nasal sinuses: Secondary | ICD-10-CM | POA: Insufficient documentation

## 2019-01-09 DIAGNOSIS — I10 Essential (primary) hypertension: Secondary | ICD-10-CM | POA: Diagnosis not present

## 2019-01-09 DIAGNOSIS — R519 Headache, unspecified: Secondary | ICD-10-CM | POA: Insufficient documentation

## 2019-01-09 DIAGNOSIS — G8929 Other chronic pain: Secondary | ICD-10-CM

## 2019-01-09 MED ORDER — PREDNISONE 10 MG PO TABS: 40.0000 mg | ORAL_TABLET | Freq: Every day | ORAL | 0 refills | Status: AC

## 2019-01-09 NOTE — Discharge Instructions (Addendum)
Use your nasal rinse with distilled water. Use Flonase as discussed (right hand to left nostril, left hand to right nostril). ADD Zyrtec daily. (take at night if it makes you sleepy). ADD Coricidin HBP as needed as directed. If headache persists, take prednisone. Follow up with your doctor if symptoms continue.

## 2019-01-09 NOTE — ED Triage Notes (Signed)
Facial pain, c/o sinus pain rating pain 7/10.  C/o headache for 3 days.  Seen by PCP by telephone and treated with Sudafed, with no relief.

## 2019-01-09 NOTE — ED Provider Notes (Signed)
[facility] EMERGENCY DEPARTMENT Provider Note   CSN: [id number] Arrival date & time: 01/09/19  1335     History   Chief Complaint Chief Complaint  Patient presents with  . Facial Pain    HPI Summer Carpenter is a 53 y.o. female.     53yo female presents with complaint of sinus pressure and left occipital headaches off and on for the past few months. Patient reports taking Tylenol without relief of her symptoms, had a virtual visit with her PCP who recommended Sudafed which she has taken for the past week without improvement. Unable to take NSAIDs due to stomach ulcers.  Also taking Flonase. Patient reports clear nasal drainage, denies sinus congestion. Denies cough, fevers, ear pain, dental pain. Patient reports multiple negative COVID tests. No other complaints or concerns.      Past Medical History:  Diagnosis Date  . Anxiety   . GERD (gastroesophageal reflux disease)   . History of echocardiogram    a. 12/2012: EF 55-60%, no WMA, trivial TR  . Hyperlipidemia   . Hypertension   . Obesity   . Palpitations   . Prediabetes   . Sleep apnea     Patient Active Problem List   Diagnosis Date Noted  . Fecal occult blood test positive 02/26/2018  . Screening for colorectal cancer 02/26/2018  . Encounter for well woman exam with routine gynecological exam 02/26/2018  . DUB (dysfunctional uterine bleeding) 02/26/2018  . Normal coronary arteries 10/09/2016  . Chest pain at rest 10/09/2016  . Hypokalemia 08/28/2013  . Anemia 08/28/2013  . mild OSA (obstructive sleep apnea) 12/22/2012  . Fatigue 10/08/2012  . Snoring 10/08/2012  . Palpitations 09/14/2012  . Essential hypertension 09/14/2012  . Hyperlipidemia 09/14/2012  . Obesity (BMI 35.0-39.9 without comorbidity) 09/14/2012    Past Surgical History:  Procedure Laterality Date  . CARDIAC SURGERY     catheterization in 2002  . CERVICAL POLYPECTOMY N/A 12/15/2014   Procedure: ENDOMETRIAL POLYPECTOMY;  Surgeon:  Florian Buff, MD;  Location: [facility];  Service: Gynecology;  Laterality: N/A;  . CHOLECYSTECTOMY    . DILITATION & CURRETTAGE/HYSTROSCOPY WITH NOVASURE ABLATION N/A 12/15/2014   Procedure: DILATATION & CURETTAGE/HYSTEROSCOPY WITH ENDOMETRIAL NOVASURE ABLATION;  Surgeon: Florian Buff, MD;  Location: [facility];  Service: Gynecology;  Laterality: N/A;  . DOPPLER ECHOCARDIOGRAPHY  07/30/2000 [place]   EF 55-66%,lv normal  . NM MYOCAR PERF WALL MOTION  10/22/2011  . OOPHORECTOMY  2008   lt     OB History    Gravida  6   Para  3   Term  3   Preterm      AB  3   Living  3     SAB  3   TAB      Ectopic      Multiple      Live Births  3            Home Medications    Prior to Admission medications   Medication Sig Start Date End Date Taking? Authorizing Provider  acetaminophen (TYLENOL) 500 MG tablet Take 1,000 mg by mouth every 6 (six) hours as needed for mild pain or moderate pain.     [provider]  aspirin EC 81 MG tablet Take 81 mg by mouth daily.      [provider]  ferrous gluconate (FERGON) 324 MG tablet Take 324 mg by mouth daily. 03/28/18   [provider]  LINZESS 145 MCG CAPS capsule  Take 145 mcg by mouth daily as needed (for constipation).  02/21/18   [provider]  losartan-hydrochlorothiazide (HYZAAR) 50-12.5 MG tablet TAKE 1 TABLET BY MOUTH EVERY DAY Patient taking differently: Take 1 tablet by mouth daily.  01/14/18   Croitoru, Mihai, MD  megestrol (MEGACE) 40 MG tablet 3 tablets a day for 5 days, 2 tablets a day for 5 days then 1 tablet daily 04/29/18   Florian Buff, MD  metroNIDAZOLE (FLAGYL) 500 MG tablet Take 1 tablet (500 mg total) by mouth 2 (two) times daily. 10/30/18   Cresenzo-Dishmon, Joaquim Lai, CNM  naproxen (NAPROSYN) 500 MG tablet Take 500 mg by mouth 2 (two) times daily as needed. For pain 03/27/18   [provider]  omeprazole (PRILOSEC) 20 MG capsule Take 20 mg by mouth daily. 02/03/18    [provider]  potassium chloride (K-DUR) 10 MEQ tablet TAKE 1 TABLET BY MOUTH EVERY DAY 08/04/18   Croitoru, Mihai, MD  predniSONE (DELTASONE) 10 MG tablet Take 4 tablets (40 mg total) by mouth daily for 5 days. 01/09/19 01/14/19  Tacy Learn, PA-C  propranolol (INDERAL) 20 MG tablet Take 20 mg by mouth as needed. For palpitations    [provider]  propranolol ER (INDERAL LA) 160 MG SR capsule TAKE 1 CAPSULE BY MOUTH TWICE DAILY 12/05/18   Croitoru, Mihai, MD  sertraline (ZOLOFT) 100 MG tablet Take 100 mg by mouth every evening. 03/27/18   [provider]  simvastatin (ZOCOR) 10 MG tablet Take 10 mg by mouth at bedtime.     [provider]  sucralfate (CARAFATE) 1 G tablet Take 1 tablet by mouth every morning.  01/08/15   [provider]  valACYclovir (VALTREX) 500 MG tablet Take 1 tablet (500 mg total) by mouth daily. 09/03/18   Estill Dooms, NP  vitamin C (ASCORBIC ACID) 500 MG tablet Take 1 tablet by mouth every morning. 03/28/18   [provider]    Family History Family History  Problem Relation Age of Onset  . Hypertension Mother   . Heart attack Mother   . Seizures Father   . Hypertension Brother   . Hypertension Brother   . Hypertension Sister   . Early death Brother   . Hypertension Son     Social History Social History   Tobacco Use  . Smoking status: Never Smoker  . Smokeless tobacco: Never Used  Substance Use Topics  . Alcohol use: Yes    Comment: Occasionally, once a month  . Drug use: No     Allergies   Septra [bactrim] and Sulfamethoxazole-trimethoprim   Review of Systems Review of Systems  Constitutional: Negative for chills and fever.  HENT: Positive for sinus pressure and sinus pain. Negative for congestion, dental problem, ear pain, postnasal drip, rhinorrhea and sore throat.   Eyes: Negative for visual disturbance.  Respiratory: Negative for cough.   Musculoskeletal: Negative for  arthralgias and myalgias.  Skin: Negative for rash and wound.  Allergic/Immunologic: Negative for immunocompromised state.  Neurological: Positive for headaches. Negative for dizziness, speech difficulty and weakness.  All other systems reviewed and are negative.    Physical Exam Updated Vital Signs BP 136/88 (BP Location: Right Arm)   Pulse 78   Temp 98.3 F (36.8 C) (Oral)   Resp 16   Ht 5\' 7"  (1.702 m)   Wt 108 kg   LMP 12/23/2018 (Exact Date)   SpO2 100%   BMI 37.28 kg/m   Physical Exam Vitals signs  and nursing note reviewed.  Constitutional:      General: She is not in acute distress.    Appearance: She is well-developed. She is not diaphoretic.  HENT:     Head: Normocephalic and atraumatic.     Right Ear: Tympanic membrane and ear canal normal.     Left Ear: Tympanic membrane and ear canal normal.     Nose: Septal deviation and mucosal edema present. No congestion or rhinorrhea.     Mouth/Throat:     Mouth: Mucous membranes are moist.     Pharynx: No oropharyngeal exudate or posterior oropharyngeal erythema.  Eyes:     Conjunctiva/sclera: Conjunctivae normal.  Neck:     Musculoskeletal: Normal range of motion and neck supple. No muscular tenderness.  Cardiovascular:     Rate and Rhythm: Normal rate and regular rhythm.     Pulses: Normal pulses.     Heart sounds: Normal heart sounds.  Pulmonary:     Effort: Pulmonary effort is normal.     Breath sounds: Normal breath sounds.  Lymphadenopathy:     Cervical: No cervical adenopathy.  Skin:    General: Skin is warm and dry.     Findings: No erythema or rash.  Neurological:     Mental Status: She is alert and oriented to person, place, and time.  Psychiatric:        Behavior: Behavior normal.      ED Treatments / Results  Labs (all labs ordered are listed, but only abnormal results are displayed) Labs Reviewed - No data to display  EKG None  Radiology No results found.  Procedures Procedures  (including critical care time)  Medications Ordered in ED Medications - No data to display   Initial Impression / Assessment and Plan / ED Course  I have reviewed the triage vital signs and the nursing notes.  Pertinent labs & imaging results that were available during my care of the patient were reviewed by me and considered in my medical decision making (see chart for details).  Clinical Course as of Jan 08 1457  Fri Jan 09, 2019  1451 53   [LM]  1456 53yo female with headache and sinus pressure off and on for several months, not improving with Tylenol, Sudafed, Flonase. On exam, no findings to suggest acute bacterial sinusitis. Boggy nasal membranes with deviated septum. Patient is unable to take NSAIDs, recommend add Zyrtec and Coricidin HBP, saline nasal rinse, STOP the sudafed. Rx for Prednisone if not improving with the zyrtec and coricidin. Follow up with PCP if symptoms continue.    [LM]    Clinical Course User Index [LM] Tacy Learn, PA-C      Final Clinical Impressions(s) / ED Diagnoses   Final diagnoses:  Chronic nonintractable headache, unspecified headache type  Sinus pressure    ED Discharge Orders         Ordered    predniSONE (DELTASONE) 10 MG tablet  Daily     01/09/19 1438           Roque Lias 01/09/19 1458    Hayden Rasmussen, MD 01/09/19 [phone]

## 2019-03-25 ENCOUNTER — Other Ambulatory Visit: Payer: Self-pay | Admitting: Cardiovascular Disease

## 2019-03-25 NOTE — Telephone Encounter (Signed)
*  STAT* If patient is at the pharmacy, call can be transferred to refill team.   1. Which medications need to be refilled? (please list name of each medication and dose if known) propranolol (INDERAL) 20 MG tablet  2. Which pharmacy/location (including street and city if local pharmacy) is medication to be sent to? [facility] [phone] - [place], [address] AT [address]  3. Do they need a 30 day or 90 day supply? 30 day

## 2019-03-26 ENCOUNTER — Other Ambulatory Visit: Payer: Self-pay

## 2019-03-26 MED ORDER — PROPRANOLOL HCL 20 MG PO TABS: 20.0000 mg | ORAL_TABLET | ORAL | 3 refills | Status: DC | PRN

## 2019-03-26 NOTE — Telephone Encounter (Signed)
Rx request sent to pharmacy. Propranolol 20 mg sent to correct pharmacy---[facility] in [place], [place] per pt request.

## 2019-07-01 ENCOUNTER — Other Ambulatory Visit: Payer: Self-pay | Admitting: *Deleted

## 2019-07-02 MED ORDER — MEGESTROL ACETATE 40 MG PO TABS: ORAL_TABLET | ORAL | 3 refills | Status: DC

## 2019-10-05 ENCOUNTER — Encounter (HOSPITAL_COMMUNITY): Payer: Self-pay

## 2019-10-05 ENCOUNTER — Other Ambulatory Visit: Payer: Self-pay

## 2019-10-05 ENCOUNTER — Emergency Department (HOSPITAL_COMMUNITY)
Admission: EM | Admit: 2019-10-05 | Discharge: 2019-10-05 | Disposition: A | Discharge status: Home / Self Care | Payer: 59 | Attending: Emergency Medicine | Admitting: Emergency Medicine

## 2019-10-05 ENCOUNTER — Telehealth: Payer: Self-pay | Admitting: Cardiovascular Disease

## 2019-10-05 DIAGNOSIS — Z7982 Long term (current) use of aspirin: Secondary | ICD-10-CM | POA: Insufficient documentation

## 2019-10-05 DIAGNOSIS — I10 Essential (primary) hypertension: Secondary | ICD-10-CM | POA: Diagnosis not present

## 2019-10-05 DIAGNOSIS — R002 Palpitations: Secondary | ICD-10-CM

## 2019-10-05 DIAGNOSIS — Z79899 Other long term (current) drug therapy: Secondary | ICD-10-CM | POA: Insufficient documentation

## 2019-10-05 LAB — MAGNESIUM: Magnesium: 1.8 mg/dL (ref 1.7–2.4)

## 2019-10-05 LAB — BASIC METABOLIC PANEL
Anion gap: 12 (ref 5–15)
BUN: 15 mg/dL (ref 6–20)
CO2: 24 mmol/L (ref 22–32)
Calcium: 9.6 mg/dL (ref 8.9–10.3)
Chloride: 101 mmol/L (ref 98–111)
Creatinine, Ser: 1.14 mg/dL — ABNORMAL HIGH (ref 0.44–1.00)
GFR calc Af Amer: >60 mL/min (ref >60)
GFR calc non Af Amer: 55 mL/min — ABNORMAL LOW (ref >60)
Glucose, Bld: 100 mg/dL — ABNORMAL HIGH (ref 70–99)
Potassium: 3.3 mmol/L — ABNORMAL LOW (ref 3.5–5.1)
Sodium: 137 mmol/L (ref 135–145)

## 2019-10-05 MED ORDER — PROPRANOLOL HCL 20 MG PO TABS: 20.0000 mg | ORAL_TABLET | ORAL | 3 refills | Status: DC | PRN

## 2019-10-05 NOTE — Telephone Encounter (Signed)
New message   Patient c/o Palpitations:  High priority if patient c/o lightheadedness, shortness of breath, or chest pain  1) How long have you had palpitations/irregular HR/ Afib? Are you having the symptoms now? Per patient about a week, no   2) Are you currently experiencing lightheadedness, SOB or CP? No   3) Do you have a history of afib (atrial fibrillation) or irregular heart rhythm?yes   4) Have you checked your BP or HR? (document readings if available): 106/76 83 hr   5) Are you experiencing any other symptoms?no

## 2019-10-05 NOTE — ED Triage Notes (Signed)
Pt reports history of palpitation and takes propanolol as needed.  Reports palpations have been more frequent for the past few days and felt lightheaded today.  Has been taking propanolol without relief.

## 2019-10-05 NOTE — ED Provider Notes (Signed)
[facility] EMERGENCY DEPARTMENT Provider Note   CSN: [id number] Arrival date & time: 10/05/19  1045     History Chief Complaint  Patient presents with   Palpitations    Summer Carpenter is a 54 y.o. female history of obesity, hypertension, hyperlipidemia, anxiety, presenting to the emergency department with heart palpitations.  Patient reports she has had this condition for many years.  Her cardiologist prescribes her propanolol which she takes once in the afternoon.  For the past week she has noted a greater frequency of the palpitations.  She says these are isolated palpitations and not continuous palpitations.  She does not describe any persistent lightheadedness.  She did call her cardiologist who ordered her Holter monitor, she is not arrived.  The cardiologist also informed her that she can double her propranolol to that she is taking it twice a day instead of once.  She has not started this new regimen yet.  Her next cardiology appointment is in August, which is the earliest available.  She tells me she has had a problems with low potassium in the past.  She denies any nausea vomiting or diarrhea.  She says she feels like she does not drink enough fluids at home.  She reports that she drinks about 1 caffeinated beverage per day.  She denies any alcohol consumption.  She reports that she is getting good sleep.  She denies any chest pain.  HPI     Past Medical History:  Diagnosis Date   Anxiety    GERD (gastroesophageal reflux disease)    History of echocardiogram    a. 12/2012: EF 55-60%, no WMA, trivial TR   Hyperlipidemia    Hypertension    Obesity    Palpitations    Prediabetes    Sleep apnea     Patient Active Problem List   Diagnosis Date Noted   Fecal occult blood test positive 02/26/2018   Screening for colorectal cancer 02/26/2018   Encounter for well woman exam with routine gynecological exam 02/26/2018   DUB (dysfunctional uterine  bleeding) 02/26/2018   Normal coronary arteries 10/09/2016   Chest pain at rest 10/09/2016   Hypokalemia 08/28/2013   Anemia 08/28/2013   mild OSA (obstructive sleep apnea) 12/22/2012   Fatigue 10/08/2012   Snoring 10/08/2012   Palpitations 09/14/2012   Essential hypertension 09/14/2012   Hyperlipidemia 09/14/2012   Obesity (BMI 35.0-39.9 without comorbidity) 09/14/2012    Past Surgical History:  Procedure Laterality Date   CARDIAC SURGERY     catheterization in 2002   CERVICAL POLYPECTOMY N/A 12/15/2014   Procedure: ENDOMETRIAL POLYPECTOMY;  Surgeon: Florian Buff, MD;  Location: [facility];  Service: Gynecology;  Laterality: N/A;   CHOLECYSTECTOMY     DILITATION & CURRETTAGE/HYSTROSCOPY WITH NOVASURE ABLATION N/A 12/15/2014   Procedure: DILATATION & CURETTAGE/HYSTEROSCOPY WITH ENDOMETRIAL NOVASURE ABLATION;  Surgeon: Florian Buff, MD;  Location: [facility];  Service: Gynecology;  Laterality: N/A;   DOPPLER ECHOCARDIOGRAPHY  07/30/2000 [place]   EF 55-66%,lv normal   NM MYOCAR PERF WALL MOTION  10/22/2011   OOPHORECTOMY  2008   lt     OB History    Gravida  6   Para  3   Term  3   Preterm      AB  3   Living  3     SAB  3   TAB      Ectopic      Multiple      Live Births  3  Family History  Problem Relation Age of Onset   Hypertension Mother    Heart attack Mother    Seizures Father    Hypertension Brother    Hypertension Brother    Hypertension Sister    Early death Brother    Hypertension Son     Social History   Tobacco Use   Smoking status: Never Smoker   Smokeless tobacco: Never Used  Scientific laboratory technician Use: Never used  Substance Use Topics   Alcohol use: Yes    Comment: Occasionally, once a month   Drug use: No    Home Medications Prior to Admission medications   Medication Sig Start Date End Date Taking? Authorizing Provider  acetaminophen (TYLENOL) 500 MG tablet Take 1,000 mg by mouth  every 6 (six) hours as needed for mild pain or moderate pain.    Yes [provider]  aspirin EC 81 MG tablet Take 81 mg by mouth daily.     Yes [provider]  ferrous gluconate (FERGON) 324 MG tablet Take 324 mg by mouth daily. 03/28/18  Yes [provider]  LINZESS 145 MCG CAPS capsule Take 145 mcg by mouth daily as needed (for constipation).  02/21/18  Yes [provider]  losartan-hydrochlorothiazide (HYZAAR) 50-12.5 MG tablet TAKE 1 TABLET BY MOUTH EVERY DAY Patient taking differently: Take 1 tablet by mouth daily.  01/14/18  Yes Croitoru, Mihai, MD  megestrol (MEGACE) 40 MG tablet 3 tablets a day for 5 days, 2 tablets a day for 5 days then 1 tablet daily 07/02/19  Yes Florian Buff, MD  omeprazole (PRILOSEC) 40 MG capsule Take 40 mg by mouth daily. 09/30/19  Yes [provider]  potassium chloride (K-DUR) 10 MEQ tablet TAKE 1 TABLET BY MOUTH EVERY DAY 08/04/18  Yes Croitoru, Mihai, MD  propranolol (INNOPRAN XL) 120 MG 24 hr capsule Take 1 capsule by mouth 2 (two) times daily. 10/28/15  Yes [provider]  sertraline (ZOLOFT) 100 MG tablet Take 100 mg by mouth every evening. 03/27/18  Yes [provider]  vitamin C (ASCORBIC ACID) 500 MG tablet Take 1 tablet by mouth every morning. 03/28/18  Yes [provider]    Allergies    Septra [bactrim] and Sulfamethoxazole-trimethoprim  Review of Systems   Review of Systems  Constitutional: Negative for chills and fever.  HENT: Negative for ear pain and sore throat.   Eyes: Negative for pain and visual disturbance.  Respiratory: Negative for cough and shortness of breath.   Cardiovascular: Positive for palpitations. Negative for chest pain.  Gastrointestinal: Negative for abdominal pain, nausea and vomiting.  Genitourinary: Negative for dysuria and hematuria.  Musculoskeletal: Negative for arthralgias and back pain.  Skin: Negative for color change and rash.  Neurological:  Negative for syncope and light-headedness.  Psychiatric/Behavioral: Negative for agitation and confusion.  All other systems reviewed and are negative.   Physical Exam Updated Vital Signs BP 123/76 (BP Location: Left Arm)    Pulse 81    Temp 99.2 F (37.3 C) (Oral)    Resp 18    Ht 5\' 7"  (1.702 m)    Wt 109.8 kg    LMP 09/28/2019    SpO2 100%    BMI 37.90 kg/m   Physical Exam Vitals and nursing note reviewed.  Constitutional:      General: She is not in acute distress.    Appearance: She is well-developed.  HENT:     Head: Normocephalic and atraumatic.  Eyes:  Conjunctiva/sclera: Conjunctivae normal.  Cardiovascular:     Rate and Rhythm: Normal rate and regular rhythm.     Pulses: Normal pulses.  Pulmonary:     Effort: Pulmonary effort is normal. No respiratory distress.     Breath sounds: Normal breath sounds.  Musculoskeletal:     Cervical back: Neck supple.  Skin:    General: Skin is warm and dry.  Neurological:     General: No focal deficit present.     Mental Status: She is alert and oriented to person, place, and time.  Psychiatric:        Mood and Affect: Mood normal.        Behavior: Behavior normal.     ED Results / Procedures / Treatments   Labs (all labs ordered are listed, but only abnormal results are displayed) Labs Reviewed  BASIC METABOLIC PANEL - Abnormal; Notable for the following components:      Result Value   Potassium 3.3 (*)    Glucose, Bld 100 (*)    Creatinine, Ser 1.14 (*)    GFR calc non Af Amer 55 (*)    All other components within normal limits  MAGNESIUM    EKG EKG Interpretation  Date/Time:  Monday October 05 2019 11:03:07 EDT Ventricular Rate:  91 PR Interval:    QRS Duration: 99 QT Interval:  363 QTC Calculation: 447 R Axis:   16 Text Interpretation: Sinus rhythm Short PR interval Abnormal R-wave progression, early transition Borderline T abnormalities, diffuse leads No sig change from prior ecg, No STEMI Confirmed by  Octaviano Glow [phone]) on 10/05/2019 11:18:33 AM   Radiology No results found.  Procedures Procedures (including critical care time)  Medications Ordered in ED Medications - No data to display  ED Course  I have reviewed the triage vital signs and the nursing notes.  Pertinent labs & imaging results that were available during my care of the patient were reviewed by me and considered in my medical decision making (see chart for details).  54 yo female here with palpitations for several days  These are most likely PVC's or PAC's per her description, isolated skipped beats, not persistent or prolonged palpitations with lightheadedness.  Her cardiologist has ordered a holter monitor for her to wear and advised taking propanolol BID instead of once daily.  I think this is a reasonable plan of action.  ECG per my interpretation shows NSR with no ischemic changes, QTc wnl.  Telemetry reviewed with occasional PVC, no V tach noted.  Doubtful of ACS, infection, or PE at this time.  I checked her electrolytes here.  Mild HypoK at 3.3.  Mag okay.  We'll increase her home K+ dose for 2 weeks.  Advised cessation of caffeine at home, and no alcohol or smoking.  She agrees with this plan and would like to go home.  Okay with discharge at this time.  Clinical Course as of Oct 04 1752  Mon Oct 05, 2019  1303 No change in her symptoms.  We discussed doubling her potassium dose from 10 to 20 mEq/day for the next 2 weeks, taking her propanolol twice a day as prescribed, follow-up with her cardiologist.  She verbalized understanding.   [MT]    Clinical Course User Index [MT] Haley Roza, Carola Rhine, MD    Final Clinical Impression(s) / ED Diagnoses Final diagnoses:  Palpitations    Rx / DC Orders ED Discharge Orders    None       Haleem Hanner, Carola Rhine,  MD 10/05/19 1754

## 2019-10-05 NOTE — Discharge Instructions (Signed)
You can call Dr Alan Ripper office to schedule an appointment for your psychiatry needs.  Please follow up with your cardiologist about your monitor at home. Your potassium today was slightly low at 3.3.  I recommend you double your home potassium dose to 20 mEq per day for the next 14 days, then go back to your usual 10 mEq per day.  Try to avoid any caffeine for 1 week to see if this helps your palpitations.

## 2019-10-05 NOTE — Telephone Encounter (Signed)
Returned call to patient-made aware of recommendations.    Patient currently in ED at [facility].   Advised blood work will be completed there. Will place order for monitor and will be mailed to her.   Patient verbalized understanding.

## 2019-10-05 NOTE — Telephone Encounter (Signed)
Important to get the blood work, may be hypokalemic due to HCTZ. Please make sure the labs include K and Mg. If her underlying HR and BP permit (QJ>19 and SBP >11o), I recommend taking the propranolol 20 mg twice daily, scheduled, while we figure this out.  Please order a 3 day Zio.

## 2019-10-05 NOTE — Telephone Encounter (Signed)
Returned call to patient, patient states she is having increased palpitations.   She is having palpitations daily.  She states she is unsure what is causing the increased frequency.  She does have stress going on, unsure if this is related.   She denies caffeine intake.   She denies CP, SOB, dizziness, lightheadedness.  No other symptoms, just states they are "aggravating".    BP this AM 120/77, states HR is ranging 88-95.    She takes propanolol 20mg  PRN for palpitations.  She states she has taken it daily x 7 days and it is not helping.     She has not had recent lab work, but states she sees her PCP on 7/17 for her annual and blood work will be done then.   She is scheduled to see Dr.Croitoiru in August for her yearly visit as well.   Advised due to increased palpitations, will need to schedule sooner, may need to be APP.    Advised would send message to Dr. Loletha Grayer to review for further recommendations.  Patient aware.

## 2019-10-05 NOTE — Telephone Encounter (Signed)
Reviewed ED records. She is indeed hypokalemic. If correcting the K level fixes the palpitations, we can forego the monitor.

## 2019-10-07 ENCOUNTER — Ambulatory Visit (INDEPENDENT_AMBULATORY_CARE_PROVIDER_SITE_OTHER): Payer: 59

## 2019-10-07 DIAGNOSIS — R002 Palpitations: Secondary | ICD-10-CM | POA: Diagnosis not present

## 2019-10-23 ENCOUNTER — Other Ambulatory Visit (HOSPITAL_COMMUNITY): Payer: Self-pay | Admitting: Family Medicine

## 2019-10-23 ENCOUNTER — Other Ambulatory Visit (HOSPITAL_BASED_OUTPATIENT_CLINIC_OR_DEPARTMENT_OTHER): Payer: Self-pay | Admitting: Family Medicine

## 2019-10-23 DIAGNOSIS — Z1231 Encounter for screening mammogram for malignant neoplasm of breast: Secondary | ICD-10-CM

## 2019-10-30 ENCOUNTER — Ambulatory Visit: Payer: 59 | Admitting: Cardiovascular Disease

## 2019-10-30 ENCOUNTER — Encounter: Payer: Self-pay | Admitting: Cardiovascular Disease

## 2019-10-30 ENCOUNTER — Other Ambulatory Visit: Payer: Self-pay

## 2019-10-30 VITALS — BP 130/72 | HR 80 | Ht 67.0 in | Wt 247.4 lb

## 2019-10-30 DIAGNOSIS — E7800 Pure hypercholesterolemia, unspecified: Secondary | ICD-10-CM

## 2019-10-30 DIAGNOSIS — G4733 Obstructive sleep apnea (adult) (pediatric): Secondary | ICD-10-CM

## 2019-10-30 DIAGNOSIS — E876 Hypokalemia: Secondary | ICD-10-CM | POA: Diagnosis not present

## 2019-10-30 DIAGNOSIS — I10 Essential (primary) hypertension: Secondary | ICD-10-CM

## 2019-10-30 NOTE — Patient Instructions (Signed)
Medication Instructions:  No Changes *If you need a refill on your cardiac medications before your next appointment, please call your pharmacy*   Lab Work: None Ordered If you have labs (blood work) drawn today and your tests are completely normal, you will receive your results only by: Marland Kitchen MyChart Message (if you have MyChart) OR . A paper copy in the mail If you have any lab test that is abnormal or we need to change your treatment, we will call you to review the results.   Testing/Procedures: None Ordered   Follow-Up: At [facility], you and your health needs are our priority.  As part of our continuing mission to provide you with exceptional heart care, we have created designated Provider Care Teams.  These Care Teams include your primary Cardiologist (physician) and Advanced Practice Providers (APPs -  Physician Assistants and Nurse Practitioners) who all work together to provide you with the care you need, when you need it.  We recommend signing up for the patient portal called "MyChart".  Sign up information is provided on this After Visit Summary.  MyChart is used to connect with patients for Virtual Visits (Telemedicine).  Patients are able to view lab/test results, encounter notes, upcoming appointments, etc.  Non-urgent messages can be sent to your provider as well.   To learn more about what you can do with MyChart, go to [email or web address].    Your next appointment:   12 month(s)  The format for your next appointment:   In Person  Provider:   You may see Sanda Klein, MD or one of the following Advanced Practice Providers on your designated Care Team:    Almyra Deforest, PA-C  Fabian Sharp, PA-C or   Roby Lofts, [place]

## 2019-10-30 NOTE — Progress Notes (Signed)
[name], female   DOB: Jun 11, 1965, 54 y.o.   MRN: [id number]    Cardiology Office Note    Date:  10/30/2019   ID:  Summer Carpenter, DOB 11-12-1965, MRN [id number]  PCP:  [facility]  Cardiologist:   Sanda Klein, MD   Chief complaint: palpitations and HBP follow up   History of Present Illness:  Summer Carpenter is a 54 y.o. female with HTN and palpitations, returning for follow up.   She has had problems with increased palpitations.  The arrhythmia monitor showed occasional PACs and PVCs, but no significant sustained arrhythmia.  Treatment of hypokalemia (potassium 3.2) led to improvement in her palpitations which are now resolved.  She has actually weaned herself off of the propranolol as a result.  She reports having labs with her PCP at [facility] and her potassium was "still a little low" on July 15.  She is now taking potassium chloride 10 mEq daily.  Her blood pressure is 130/72, more than 2 weeks after stopping the propranolol.  The patient specifically denies any chest pain at rest exertion, dyspnea at rest or with exertion, orthopnea, paroxysmal nocturnal dyspnea, syncope, palpitations, focal neurological deficits, intermittent claudication, lower extremity edema, unexplained weight gain, cough, hemoptysis or wheezing.  She is planning a trip to [place] to see her first grandson, Summer Carpenter was born yesterday.  Previous monitoring has not shown any evidence of serious arrhythmia. She has normal left ventricular size, wall thickness, systolic and diastolic function and no valvular abnormalities by echocardiography performed in September 2014. She is compliant with a statin for hyperlipidemia. Nebivolol was not as effective at symptom control.  Coronary disease by remote cardiac catheterization in 2003 and had no ischemic defects on the nuclear stress test in 2013.  Past Medical History:  Diagnosis Date  .  Anxiety   . GERD (gastroesophageal reflux disease)   . History of echocardiogram    a. 12/2012: EF 55-60%, no WMA, trivial TR  . Hyperlipidemia   . Hypertension   . Obesity   . Palpitations   . Prediabetes   . Sleep apnea     Past Surgical History:  Procedure Laterality Date  . CARDIAC SURGERY     catheterization in 2002  . CERVICAL POLYPECTOMY N/A 12/15/2014   Procedure: ENDOMETRIAL POLYPECTOMY;  Surgeon: Florian Buff, MD;  Location: [facility];  Service: Gynecology;  Laterality: N/A;  . CHOLECYSTECTOMY    . DILITATION & CURRETTAGE/HYSTROSCOPY WITH NOVASURE ABLATION N/A 12/15/2014   Procedure: DILATATION & CURETTAGE/HYSTEROSCOPY WITH ENDOMETRIAL NOVASURE ABLATION;  Surgeon: Florian Buff, MD;  Location: [facility];  Service: Gynecology;  Laterality: N/A;  . DOPPLER ECHOCARDIOGRAPHY  07/30/2000 [place]   EF 55-66%,lv normal  . NM MYOCAR PERF WALL MOTION  10/22/2011  . OOPHORECTOMY  2008   lt    Current Outpatient Medications  Medication Sig Dispense Refill  . acetaminophen (TYLENOL) 500 MG tablet Take 1,000 mg by mouth every 6 (six) hours as needed for mild pain or moderate pain.     Marland Kitchen aspirin EC 81 MG tablet Take 81 mg by mouth daily.      . ferrous gluconate (FERGON) 324 MG tablet Take 324 mg by mouth daily.    Marland Kitchen LINZESS 145 MCG CAPS capsule Take 145 mcg by mouth daily as needed (for constipation).   2  . losartan-hydrochlorothiazide (HYZAAR) 50-12.5 MG tablet TAKE 1 TABLET BY MOUTH EVERY DAY (Patient taking differently: Take 1 tablet  by mouth daily. ) 90 tablet 3  . megestrol (MEGACE) 40 MG tablet 3 tablets a day for 5 days, 2 tablets a day for 5 days then 1 tablet daily 45 tablet 3  . omeprazole (PRILOSEC) 40 MG capsule Take 40 mg by mouth daily.    . potassium chloride (K-DUR) 10 MEQ tablet TAKE 1 TABLET BY MOUTH EVERY DAY 90 tablet 0  . propranolol (INNOPRAN XL) 120 MG 24 hr capsule Take 1 capsule by mouth 2 (two) times daily.    . sertraline (ZOLOFT) 100 MG tablet Take 100 mg  by mouth every evening.    . vitamin C (ASCORBIC ACID) 500 MG tablet Take 1 tablet by mouth every morning.    Marland Kitchen azelastine (ASTELIN) 0.1 % nasal spray Place 1 spray into both nostrils 2 (two) times daily as needed.    . propranolol (INDERAL) 20 MG tablet Take 20 mg by mouth daily as needed.     No current facility-administered medications for this visit.    Allergies:   Septra [bactrim] and Sulfamethoxazole-trimethoprim   Social History   Socioeconomic History  . Marital status: Single    Spouse name: Not on file  . Number of children: Not on file  . Years of education: Not on file  . Highest education level: Not on file  Occupational History  . Not on file  Tobacco Use  . Smoking status: Never Smoker  . Smokeless tobacco: Never Used  Vaping Use  . Vaping Use: Never used  Substance and Sexual Activity  . Alcohol use: Yes    Comment: Occasionally, once a month  . Drug use: No  . Sexual activity: Yes    Birth control/protection: None  Other Topics Concern  . Not on file  Social History Narrative  . Not on file   Social Determinants of Health   Financial Resource Strain:   . Difficulty of Paying Living Expenses:   Food Insecurity:   . Worried About Charity fundraiser in the Last Year:   . Arboriculturist in the Last Year:   Transportation Needs:   . Film/video editor (Medical):   Marland Kitchen Lack of Transportation (Non-Medical):   Physical Activity:   . Days of Exercise per Week:   . Minutes of Exercise per Session:   Stress:   . Feeling of Stress :   Social Connections:   . Frequency of Communication with Friends and Family:   . Frequency of Social Gatherings with Friends and Family:   . Attends Religious Services:   . Active Member of Clubs or Organizations:   . Attends Archivist Meetings:   Marland Kitchen Marital Status:      Family History:  The patient's family history includes Early death in her brother; Heart attack in her mother; Hypertension in her brother,  brother, mother, sister, and son; Seizures in her father.   ROS:   Please see the history of present illness.    Review of Systems  All other systems reviewed and are negative. All other systems are reviewed and are negative.   PHYSICAL EXAM:   VS:  BP (!) 130/72   Pulse 80   Ht 5\' 7"  (1.702 m)   Wt (!) 247 lb 6.4 oz (112.2 kg)   BMI 38.75 kg/m      General: Alert, oriented x3, no distress, severely obese Head: no evidence of trauma, PERRL, EOMI, no exophtalmos or lid lag, no myxedema, no xanthelasma; normal ears, nose and  oropharynx Neck: normal jugular venous pulsations and no hepatojugular reflux; brisk carotid pulses without delay and no carotid bruits Chest: clear to auscultation, no signs of consolidation by percussion or palpation, normal fremitus, symmetrical and full respiratory excursions Cardiovascular: normal position and quality of the apical impulse, regular rhythm, normal first and second heart sounds, no murmurs, rubs or gallops Abdomen: no tenderness or distention, no masses by palpation, no abnormal pulsatility or arterial bruits, normal bowel sounds, no hepatosplenomegaly Extremities: no clubbing, cyanosis or edema; 2+ radial, ulnar and brachial pulses bilaterally; 2+ right femoral, posterior tibial and dorsalis pedis pulses; 2+ left femoral, posterior tibial and dorsalis pedis pulses; no subclavian or femoral bruits Neurological: grossly nonfocal Psych: Normal mood and affect    Wt Readings from Last 3 Encounters:  10/30/19 (!) 247 lb 6.4 oz (112.2 kg)  10/05/19 242 lb (109.8 kg)  01/09/19 238 lb (108 kg)      Studies/Labs Reviewed:   EKG:  EKG is ordered today. It shows NSR,  incomplete RBBB, QRS 100 ms, normal repol, QTc 435 ms.  Recent Labs: 10/05/2019: BUN 15; Creatinine, Ser 1.14; Magnesium 1.8; Potassium 3.3; Sodium 137    ASSESSMENT:    1. Essential hypertension   2. Hypokalemia   3. Hypercholesterolemia   4. Severe obesity (BMI 35.0-39.9)  with comorbidity ([place])   5. mild OSA (obstructive sleep apnea)      PLAN:  In order of problems listed above:  1. HTN: Well-controlled was difficult to control without thiazide diuretics.  However, these are also responsible for her hypokalemia.  Continue potassium supplements and try to increase intake of potassium rich foods (fruits and vegetables).  If the problem continues to occur, consider switching to amlodipine for blood pressure control or adding an aldosterone antagonist. 2. Hypokalemia: Often precipitates palpitations.  3. HLP: On statin, labs checked by PCP. 4. Obesity: Remains severely obese, which underlies her mild sleep apnea and hypertension.  Strongly encouraged further attempts at weight loss. 5. OSA: Mild, not meeting criteria for CPAP.  Currently asymptomatic, denies daytime hypersomnolence.  Medication Adjustments/Labs and Tests Ordered: Current medicines are reviewed at length with the patient today.  Concerns regarding medicines are outlined above.  Medication changes, Labs and Tests ordered today are listed below. Patient Instructions  Medication Instructions:  No Changes *If you need a refill on your cardiac medications before your next appointment, please call your pharmacy*   Lab Work: None Ordered If you have labs (blood work) drawn today and your tests are completely normal, you will receive your results only by: Marland Kitchen MyChart Message (if you have MyChart) OR . A paper copy in the mail If you have any lab test that is abnormal or we need to change your treatment, we will call you to review the results.   Testing/Procedures: None Ordered   Follow-Up: At [facility], you and your health needs are our priority.  As part of our continuing mission to provide you with exceptional heart care, we have created designated Provider Care Teams.  These Care Teams include your primary Cardiologist (physician) and Advanced Practice Providers (APPs -  Physician  Assistants and Nurse Practitioners) who all work together to provide you with the care you need, when you need it.  We recommend signing up for the patient portal called "MyChart".  Sign up information is provided on this After Visit Summary.  MyChart is used to connect with patients for Virtual Visits (Telemedicine).  Patients are able to view lab/test results, encounter notes,  upcoming appointments, etc.  Non-urgent messages can be sent to your provider as well.   To learn more about what you can do with MyChart, go to [email or web address].    Your next appointment:   12 month(s)  The format for your next appointment:   In Person  Provider:   You may see Sanda Klein, MD or one of the following Advanced Practice Providers on your designated Care Team:    Almyra Deforest, PA-C  Fabian Sharp, [place] or   Roby Lofts, PA-C       Signed, Sanda Klein, MD  10/30/2019 1:23 PM    [address], [place], [place]  [address] Phone: [phone]; Fax: [phone]

## 2019-11-11 ENCOUNTER — Other Ambulatory Visit (HOSPITAL_COMMUNITY)
Admission: RE | Admit: 2019-11-11 | Discharge: 2019-11-11 | Disposition: A | Discharge status: Home / Self Care | Payer: 59 | Source: Ambulatory Visit | Attending: Adult Health | Admitting: Adult Health

## 2019-11-11 ENCOUNTER — Ambulatory Visit (INDEPENDENT_AMBULATORY_CARE_PROVIDER_SITE_OTHER): Payer: 59 | Admitting: Adult Health

## 2019-11-11 ENCOUNTER — Encounter: Payer: Self-pay | Admitting: Adult Health

## 2019-11-11 VITALS — BP 124/77 | HR 90 | Ht 67.0 in | Wt 246.0 lb

## 2019-11-11 DIAGNOSIS — Z01419 Encounter for gynecological examination (general) (routine) without abnormal findings: Secondary | ICD-10-CM | POA: Insufficient documentation

## 2019-11-11 DIAGNOSIS — Z1211 Encounter for screening for malignant neoplasm of colon: Secondary | ICD-10-CM

## 2019-11-11 DIAGNOSIS — N920 Excessive and frequent menstruation with regular cycle: Secondary | ICD-10-CM | POA: Diagnosis not present

## 2019-11-11 LAB — HEMOCCULT GUIAC POC 1CARD (OFFICE): Fecal Occult Blood, POC: NEGATIVE

## 2019-11-11 NOTE — Progress Notes (Signed)
[name], female   DOB: Jan 17, 1966, 54 y.o.   MRN: [id number] History of Present Illness:  Summer Carpenter is a 54 year old black female,single, [id number], in for a well woman gyn exam and pap. PCP is [place]  Current Medications, Allergies, Past Medical History, Past Surgical History, Family History and Social History were reviewed in [employer] record.     Review of Systems: Patient denies any headaches, hearing loss, fatigue, blurred vision, shortness of breath, chest pain, abdominal pain, problems with bowel movements, urination, or intercourse. No joint pain or mood swings. Has hot flashes and still spots if does not take megace Can't lose weight    Physical Exam:BP 124/77 (BP Location: Left Arm, Patient Position: Sitting, Cuff Size: Normal)   Pulse 90   Ht 5\' 7"  (1.702 m)   Wt 246 lb (111.6 kg)   BMI 38.53 kg/m  General:  Well developed, well nourished, no acute distress Skin:  Warm and dry,has numerous moles  Neck:  Midline trachea, normal thyroid, good ROM, no lymphadenopathy Lungs; Clear to auscultation bilaterally Breast:  No dominant palpable mass, retraction, or nipple discharge Cardiovascular: Regular rate and rhythm Abdomen:  Soft, non tender, no hepatosplenomegaly Pelvic:  External genitalia is normal in appearance, no lesions.  The vagina is normal in appearance. Urethra has no lesions or masses. The cervix is bulbous and deviated to the left, friable with EC brush, pap with GC/CHL and  high risk HPV genotyping performed.   Uterus is felt to be normal size, shape, and contour.  No adnexal masses or tenderness noted.Bladder is non tender, no masses felt. Rectal: Good sphincter tone, no polyps, + hemorrhoids felt.  Hemoccult negative. Extremities/musculoskeletal:  No swelling or varicosities noted, no clubbing or cyanosis Psych:  No mood changes, alert and cooperative,seems happy AA is 2 Fall risk is low PHQ 9 score is 7, no SI, on  zoloft  [facility] - 11/11/19 0937      Pregnancy Intention Screening   Does the patient want to become pregnant in the next year? N/A    Does the patient's partner want to become pregnant in the next year? N/A    Would the patient like to discuss contraceptive options today? N/A      Contraception Wrap Up   Contraception Counseling Provided No         Examination chaperoned by Dwyane Dee LPN.   Impression and Plan: 1. Encounter for gynecological examination with Papanicolaou smear of cervix Pap sent Physical in 1 year  Pap in 3 if normal Mammogram yearly Labs with PCP Colonoscopy per GI Call healthy [facility] and [facility] in [place]   2. Encounter for screening fecal occult blood testing   3. Spotting Will get GYN [place] to assess endometrial strip, in about a week

## 2019-11-12 LAB — CYTOLOGY - PAP
Adequacy: ABSENT
Chlamydia: NEGATIVE
Comment: NEGATIVE
Comment: NEGATIVE
Comment: NORMAL
Diagnosis: NEGATIVE
High risk HPV: NEGATIVE
Neisseria Gonorrhea: NEGATIVE

## 2019-11-17 ENCOUNTER — Ambulatory Visit (INDEPENDENT_AMBULATORY_CARE_PROVIDER_SITE_OTHER): Payer: 59

## 2019-11-17 DIAGNOSIS — N920 Excessive and frequent menstruation with regular cycle: Secondary | ICD-10-CM

## 2019-11-17 NOTE — Progress Notes (Addendum)
PELVIC US TA/TV: enlarged heterogeneous anteverted uterus with mult fibroids,EEC 4.8 mm,normal right ovary,ovary appears mobile,left oophorectomy

## 2019-11-20 ENCOUNTER — Telehealth: Payer: Self-pay | Admitting: Adult Health

## 2019-11-20 NOTE — Telephone Encounter (Signed)
Spoke with Summer Carpenter the US showed multiple fibroids and normal right ovary and left oophorectomy and EEC is 4.8 mm, may needs endometrial biopsy with discuss with MD Monday and call you back

## 2019-11-23 ENCOUNTER — Telehealth: Payer: Self-pay | Admitting: Adult Health

## 2019-11-23 NOTE — Telephone Encounter (Signed)
Pt needs to speak to Jenn/ spoke to Her Friday and she was to hear back from her today

## 2019-11-23 NOTE — Telephone Encounter (Signed)
Pt aware that Dr Glo Herring will look at [place] and I will call her back once he has

## 2019-11-23 NOTE — Telephone Encounter (Signed)
Left message to call me, that called with Dr Glo Herring.

## 2019-11-24 ENCOUNTER — Telehealth: Payer: Self-pay | Admitting: Adult Health

## 2019-11-24 NOTE — Telephone Encounter (Signed)
Pt returning your call from yesterday.

## 2019-11-24 NOTE — Telephone Encounter (Signed)
Pt has appt for tomorrow for endometrial biopsy, can take Advil before visit

## 2019-11-25 ENCOUNTER — Other Ambulatory Visit: Payer: Self-pay | Admitting: Obstetrics and Gynecology

## 2019-11-25 ENCOUNTER — Other Ambulatory Visit: Payer: Self-pay

## 2019-11-25 ENCOUNTER — Encounter: Payer: Self-pay | Admitting: Obstetrics and Gynecology

## 2019-11-25 ENCOUNTER — Ambulatory Visit (INDEPENDENT_AMBULATORY_CARE_PROVIDER_SITE_OTHER): Payer: 59 | Admitting: Obstetrics and Gynecology

## 2019-11-25 VITALS — BP 132/82 | HR 76 | Ht 67.0 in | Wt 246.4 lb

## 2019-11-25 DIAGNOSIS — N938 Other specified abnormal uterine and vaginal bleeding: Secondary | ICD-10-CM

## 2019-11-25 NOTE — Progress Notes (Signed)
[name], female     DOB: [birth year], 54 y.o.     MRN: [id number]   Endometrial Biopsy: Patient given informed consent, signed copy in the chart, time out was performed. Time out taken. The patient was placed in the lithotomy position and the cervix brought into view with sterile speculum.  Portio of cervix cleansed x 2 with betadine swabs.  A tenaculum was placed in the anterior lip of the cervix. She was extremely stenotic and had to be manually dilated with an 11 french.  The uterus was sounded for depth of 6 cm, Milex uterine Explora 3 mm was introduced to into the uterus, suction created,  and a scanty endometrial sample was obtained. All equipment was removed and accounted for.   The patient tolerated the procedure well.    Patient given post procedure instructions.  Followup: BY PHONE FOR RESULTS.   By signing my name below, I, [employer], attest that this documentation has been prepared under the direction and in the presence of Jonnie Kind, MD. Electronically Signed: [place]. 11/25/19. 2:03 PM.  I personally performed the services described in this documentation, which was SCRIBED in my presence. The recorded information has been reviewed and considered accurate. It has been edited as necessary during review. Jonnie Kind, MD

## 2019-11-26 ENCOUNTER — Telehealth: Payer: Self-pay | Admitting: Adult Health

## 2019-11-26 NOTE — Telephone Encounter (Signed)
I called Summer Carpenter and made her aware no malignancy

## 2019-12-03 ENCOUNTER — Ambulatory Visit (HOSPITAL_COMMUNITY): Payer: 59

## 2019-12-04 ENCOUNTER — Ambulatory Visit (HOSPITAL_COMMUNITY)
Admission: RE | Admit: 2019-12-04 | Discharge: 2019-12-04 | Disposition: A | Discharge status: Home / Self Care | Payer: 59 | Source: Ambulatory Visit | Attending: Family Medicine | Admitting: Family Medicine

## 2019-12-04 ENCOUNTER — Other Ambulatory Visit: Payer: Self-pay

## 2019-12-04 DIAGNOSIS — Z1231 Encounter for screening mammogram for malignant neoplasm of breast: Secondary | ICD-10-CM | POA: Diagnosis present

## 2019-12-06 ENCOUNTER — Telehealth: Payer: Self-pay | Admitting: Obstetrics and Gynecology

## 2019-12-06 NOTE — Telephone Encounter (Signed)
Pt reports that she is not having any bleeding, pain, or concerns.  Pt made aware of Biopsy by Derrek Monaco.

## 2019-12-08 ENCOUNTER — Ambulatory Visit: Payer: BC Managed Care – PPO | Admitting: Cardiovascular Disease

## 2019-12-13 ENCOUNTER — Telehealth: Payer: Self-pay | Admitting: Obstetrics and Gynecology

## 2019-12-13 NOTE — Telephone Encounter (Signed)
Patient aware of benign results.

## 2019-12-25 ENCOUNTER — Telehealth: Payer: Self-pay | Admitting: Adult Health

## 2019-12-25 MED ORDER — FLUCONAZOLE 150 MG PO TABS: ORAL_TABLET | ORAL | 1 refills | Status: DC

## 2019-12-25 NOTE — Telephone Encounter (Signed)
Patient called stating that she would like for [facility] to call her in something for Yeast. Pt states that she has switched laundry detergent and now she has a yeast infection. Pt states that she uses the Pharmacy in [place].

## 2019-12-25 NOTE — Telephone Encounter (Signed)
Telephone patient at home number. Patient changed laundry detergent. Tried using Monistat and no relief from yeast infection. Patient would like something called in. Advised if not hear back from office to check with pharmacy. Patient voiced understanding.

## 2019-12-25 NOTE — Addendum Note (Signed)
Addended by: Derrek Monaco A on: 12/25/2019 01:48 PM   Modules accepted: Orders

## 2019-12-25 NOTE — Telephone Encounter (Signed)
Will rx diflucan  

## 2020-01-12 ENCOUNTER — Telehealth: Payer: Self-pay | Admitting: Cardiovascular Disease

## 2020-01-12 NOTE — Telephone Encounter (Signed)
lvm for patient to return call to get follow up scheduled with Croitoru from recall list

## 2020-01-13 ENCOUNTER — Other Ambulatory Visit: Payer: Self-pay | Admitting: Cardiovascular Disease

## 2020-01-19 MED ORDER — PROPRANOLOL HCL ER 120 MG PO CP24: 120.0000 mg | ORAL_CAPSULE | Freq: Every day | ORAL | 11 refills | Status: DC

## 2020-01-20 ENCOUNTER — Other Ambulatory Visit: Payer: Self-pay | Admitting: Adult Health

## 2020-01-20 MED ORDER — FLUCONAZOLE 150 MG PO TABS: ORAL_TABLET | ORAL | 1 refills | Status: DC

## 2020-01-20 MED ORDER — METRONIDAZOLE 500 MG PO TABS: 500.0000 mg | ORAL_TABLET | Freq: Two times a day (BID) | ORAL | 0 refills | Status: DC

## 2020-01-20 NOTE — Progress Notes (Signed)
rx flagyl and diflucan

## 2020-04-06 ENCOUNTER — Other Ambulatory Visit: Payer: Self-pay | Admitting: Cardiovascular Disease

## 2020-04-07 ENCOUNTER — Telehealth: Payer: Self-pay | Admitting: Cardiovascular Disease

## 2020-04-07 MED ORDER — PROPRANOLOL HCL 20 MG PO TABS: ORAL_TABLET | ORAL | 1 refills | Status: DC

## 2020-04-07 NOTE — Telephone Encounter (Signed)
Patient called and stated that she takes propranolol ER (INDERAL LA) 120 MG 24 hr capsule every day. But she also takes propranolol ER (INDERAL LA) 20 MG 24 hr capsule as needed for rapid heartbeat, but it is being denied. Wants to talk with Dr. Salena Saner or nurse to discuss.

## 2020-04-07 NOTE — Telephone Encounter (Signed)
Returned the call to the patient. She has been advised that the Propanolol ER 20 mg was no longer on her list and would need approval to prescribe it again.  She stated that she does not use the as needed propanolol very much and a 30 day supply typically lasts her 3 months.   She currently takes Propranolol 120 mg once daily and that her blood pressure typically stays around 120/75. She did not have any exact readings.

## 2020-04-07 NOTE — Telephone Encounter (Signed)
Also refill the propranolol 20 mg immediate release, please. GN

## 2020-04-07 NOTE — Telephone Encounter (Signed)
Please re-prescribe her propranolol LA 120 mg daily

## 2020-04-07 NOTE — Telephone Encounter (Signed)
Medication has been sent in and patient made aware.

## 2020-04-07 NOTE — Telephone Encounter (Signed)
Left message for pt to call as the short acting propranolol has fallen off her medication list.

## 2020-05-04 MED ORDER — PROPRANOLOL HCL ER 120 MG PO CP24: 120.0000 mg | ORAL_CAPSULE | Freq: Every day | ORAL | 1 refills | Status: AC

## 2020-06-08 ENCOUNTER — Other Ambulatory Visit: Payer: Self-pay | Admitting: Cardiovascular Disease

## 2020-07-26 ENCOUNTER — Other Ambulatory Visit: Payer: Self-pay | Admitting: Obstetrics & Gynecology

## 2020-08-17 ENCOUNTER — Emergency Department (HOSPITAL_COMMUNITY): Payer: 59

## 2020-08-17 ENCOUNTER — Encounter (HOSPITAL_COMMUNITY): Payer: Self-pay

## 2020-08-17 ENCOUNTER — Emergency Department (HOSPITAL_COMMUNITY)
Admission: EM | Admit: 2020-08-17 | Discharge: 2020-08-17 | Disposition: A | Discharge status: Home / Self Care | Payer: 59 | Attending: Emergency Medicine | Admitting: Emergency Medicine

## 2020-08-17 ENCOUNTER — Other Ambulatory Visit: Payer: Self-pay

## 2020-08-17 DIAGNOSIS — I10 Essential (primary) hypertension: Secondary | ICD-10-CM | POA: Insufficient documentation

## 2020-08-17 DIAGNOSIS — Z79899 Other long term (current) drug therapy: Secondary | ICD-10-CM | POA: Diagnosis not present

## 2020-08-17 DIAGNOSIS — M545 Low back pain, unspecified: Secondary | ICD-10-CM | POA: Insufficient documentation

## 2020-08-17 DIAGNOSIS — R109 Unspecified abdominal pain: Secondary | ICD-10-CM | POA: Diagnosis not present

## 2020-08-17 DIAGNOSIS — Z7982 Long term (current) use of aspirin: Secondary | ICD-10-CM | POA: Diagnosis not present

## 2020-08-17 LAB — CBC WITH DIFFERENTIAL/PLATELET
Abs Immature Granulocytes: 0.02 10*3/uL (ref 0.00–0.07)
Basophils Absolute: 0 10*3/uL (ref 0.0–0.1)
Basophils Relative: 0 %
Eosinophils Absolute: 0.3 10*3/uL (ref 0.0–0.5)
Eosinophils Relative: 2 %
HCT: 45 % (ref 36.0–46.0)
Hemoglobin: 14.8 g/dL (ref 12.0–15.0)
Immature Granulocytes: 0 %
Lymphocytes Relative: 33 %
Lymphs Abs: 3.5 10*3/uL (ref 0.7–4.0)
MCH: 30.9 pg (ref 26.0–34.0)
MCHC: 32.9 g/dL (ref 30.0–36.0)
MCV: 93.9 fL (ref 80.0–100.0)
Monocytes Absolute: 0.6 10*3/uL (ref 0.1–1.0)
Monocytes Relative: 6 %
Neutro Abs: 6.4 10*3/uL (ref 1.7–7.7)
Neutrophils Relative %: 59 %
Platelets: 443 10*3/uL — ABNORMAL HIGH (ref 150–400)
RBC: 4.79 MIL/uL (ref 3.87–5.11)
RDW: 14.6 % (ref 11.5–15.5)
WBC: 10.8 10*3/uL — ABNORMAL HIGH (ref 4.0–10.5)
nRBC: 0 % (ref 0.0–0.2)

## 2020-08-17 LAB — BASIC METABOLIC PANEL
Anion gap: 8 (ref 5–15)
BUN: 9 mg/dL (ref 6–20)
CO2: 24 mmol/L (ref 22–32)
Calcium: 9.9 mg/dL (ref 8.9–10.3)
Chloride: 103 mmol/L (ref 98–111)
Creatinine, Ser: 1.04 mg/dL — ABNORMAL HIGH (ref 0.44–1.00)
GFR, Estimated: >60 mL/min (ref >60)
Glucose, Bld: 97 mg/dL (ref 70–99)
Potassium: 4 mmol/L (ref 3.5–5.1)
Sodium: 135 mmol/L (ref 135–145)

## 2020-08-17 LAB — URINALYSIS, ROUTINE W REFLEX MICROSCOPIC
Bacteria, UA: NONE SEEN
Bilirubin Urine: NEGATIVE
Glucose, UA: NEGATIVE mg/dL
Hgb urine dipstick: NEGATIVE
Ketones, ur: NEGATIVE mg/dL
Nitrite: NEGATIVE
Protein, ur: NEGATIVE mg/dL
Specific Gravity, Urine: 1.02 (ref 1.005–1.030)
pH: 5 (ref 5.0–8.0)

## 2020-08-17 MED ORDER — PREDNISONE 20 MG PO TABS: 40.0000 mg | ORAL_TABLET | Freq: Every day | ORAL | 0 refills | Status: AC

## 2020-08-17 NOTE — ED Notes (Signed)
Pt reports thought she had a UTI last week but was diagnosed with bacterial vaginosis.  After taking course of flagyl, pt started having bilateral flank pain.  Reports has been taking cipro.  PT sent to ed for further evaluation.

## 2020-08-17 NOTE — ED Provider Notes (Signed)
[facility] EMERGENCY DEPARTMENT Provider Note   CSN: [id number] Arrival date & time: 08/17/20  1009     History Chief Complaint  Patient presents with  . Flank Pain    Summer Carpenter is a 55 y.o. female.  HPI   Patient with significant medical history of anxiety, GERD, hypertension, prediabetes presents to the emergency department with chief complaint of lower back pain.  Patient states she has been having back pain for the last few weeks, but has  gotten worse over the last couple days.  She states approximate 3 weeks ago she was up lifting something and felt like she hurt her back. She denies paresthesia or weakness in her lower extremities, denies urinary incontinence, urinary retention, difficult with bowel movements.  Patient also endorses that she was recently treated for UTI, states she saw her PCP on Monday and was started on ciprofloxacin.  Patient states she has been taking this and noted that her urine seems to be better, she denies any urinary symptoms at this time.  But is concerned for a kidney infection since the pain has gotten worse.  She denies  alleviating factors.  Patient denies headaches, fevers, chills, shortness of breath, chest pain, abdominal pain, nausea, vomiting diarrhea, worsening pedal edema.  Past Medical History:  Diagnosis Date  . Anxiety   . GERD (gastroesophageal reflux disease)   . History of echocardiogram    a. 12/2012: EF 55-60%, no WMA, trivial TR  . Hyperlipidemia   . Hypertension   . Obesity   . Palpitations   . Prediabetes   . Sleep apnea     Patient Active Problem List   Diagnosis Date Noted  . Encounter for gynecological examination with Papanicolaou smear of cervix 11/11/2019  . Encounter for screening fecal occult blood testing 11/11/2019  . Spotting 11/11/2019  . Fecal occult blood test positive 02/26/2018  . Screening for colorectal cancer 02/26/2018  . Encounter for well woman exam with routine gynecological exam  02/26/2018  . DUB (dysfunctional uterine bleeding) 02/26/2018  . Normal coronary arteries 10/09/2016  . Chest pain at rest 10/09/2016  . Hypokalemia 08/28/2013  . Anemia 08/28/2013  . mild OSA (obstructive sleep apnea) 12/22/2012  . Fatigue 10/08/2012  . Snoring 10/08/2012  . Palpitations 09/14/2012  . Essential hypertension 09/14/2012  . Hyperlipidemia 09/14/2012  . Obesity (BMI 35.0-39.9 without comorbidity) 09/14/2012    Past Surgical History:  Procedure Laterality Date  . CARDIAC SURGERY     catheterization in 2002  . CERVICAL POLYPECTOMY N/A 12/15/2014   Procedure: ENDOMETRIAL POLYPECTOMY;  Surgeon: Florian Buff, MD;  Location: [facility];  Service: Gynecology;  Laterality: N/A;  . CHOLECYSTECTOMY    . DILITATION & CURRETTAGE/HYSTROSCOPY WITH NOVASURE ABLATION N/A 12/15/2014   Procedure: DILATATION & CURETTAGE/HYSTEROSCOPY WITH ENDOMETRIAL NOVASURE ABLATION;  Surgeon: Florian Buff, MD;  Location: [facility];  Service: Gynecology;  Laterality: N/A;  . DOPPLER ECHOCARDIOGRAPHY  07/30/2000 [place]   EF 55-66%,lv normal  . NM MYOCAR PERF WALL MOTION  10/22/2011  . OOPHORECTOMY  2008   lt     OB History    Gravida  6   Para  3   Term  3   Preterm      AB  3   Living  3     SAB  3   IAB      Ectopic      Multiple      Live Births  3  Family History  Problem Relation Age of Onset  . Hypertension Mother   . Heart attack Mother   . Seizures Father   . Hypertension Brother   . Hypertension Brother   . Hypertension Sister   . Early death Brother   . Hypertension Son     Social History   Tobacco Use  . Smoking status: Never Smoker  . Smokeless tobacco: Never Used  Vaping Use  . Vaping Use: Never used  Substance Use Topics  . Alcohol use: Yes    Comment: Occasionally, once a month  . Drug use: No    Home Medications Prior to Admission medications   Medication Sig Start Date End Date Taking? Authorizing Provider  predniSONE (DELTASONE)  20 MG tablet Take 2 tablets (40 mg total) by mouth daily for 4 days. 08/17/20 08/21/20 Yes Marcello Fennel, PA-C  acetaminophen (TYLENOL) 500 MG tablet Take 1,000 mg by mouth every 6 (six) hours as needed for mild pain or moderate pain.     [provider]  aspirin EC 81 MG tablet Take 81 mg by mouth daily.      [provider]  azelastine (ASTELIN) 0.1 % nasal spray Place 1 spray into both nostrils 2 (two) times daily as needed. 06/03/19   [provider]  ferrous gluconate (FERGON) 324 MG tablet Take 324 mg by mouth daily. 03/28/18   [provider]  fluconazole (DIFLUCAN) 150 MG tablet Take 1 now and 1 in 3 days 01/20/20   Estill Dooms, NP  LINZESS 145 MCG CAPS capsule Take 145 mcg by mouth daily as needed (for constipation).  02/21/18   [provider]  losartan-hydrochlorothiazide (HYZAAR) 50-12.5 MG tablet TAKE 1 TABLET BY MOUTH EVERY DAY Patient taking differently: Take 1 tablet by mouth daily.  01/14/18   Croitoru, Mihai, MD  megestrol (MEGACE) 40 MG tablet TAKE (3) TABLETS FOR 5 DAYS, TAKE (2) TABLETS FOR 5 DAYS THEN TAKE (1) TABLET DAILY. 07/26/20   Florian Buff, MD  metroNIDAZOLE (FLAGYL) 500 MG tablet Take 1 tablet (500 mg total) by mouth 2 (two) times daily. 01/20/20   Estill Dooms, NP  omeprazole (PRILOSEC) 40 MG capsule Take 40 mg by mouth daily. 09/30/19   [provider]  potassium chloride (K-DUR) 10 MEQ tablet TAKE 1 TABLET BY MOUTH EVERY DAY 08/04/18   Croitoru, Mihai, MD  propranolol (INDERAL) 20 MG tablet TAKE 1 TABLET BY MOUTH EVERY DAY AS NEEDED PALPITATIONS 06/08/20   Croitoru, Dani Gobble, MD  propranolol ER (INDERAL LA) 120 MG 24 hr capsule Take 1 capsule (120 mg total) by mouth daily. 05/04/20   Croitoru, Mihai, MD  sertraline (ZOLOFT) 100 MG tablet Take 100 mg by mouth every evening. 03/27/18   [provider]  simvastatin (ZOCOR) 10 MG tablet Take 10 mg by mouth daily.    [provider]   vitamin C (ASCORBIC ACID) 500 MG tablet Take 1 tablet by mouth every morning. 03/28/18   [provider]    Allergies    Septra [bactrim] and Sulfamethoxazole-trimethoprim  Review of Systems   Review of Systems  Constitutional: Negative for chills and fever.  HENT: Negative for congestion.   Respiratory: Negative for shortness of breath.   Cardiovascular: Negative for chest pain.  Gastrointestinal: Negative for abdominal pain, diarrhea, nausea and vomiting.  Genitourinary: Negative for difficulty urinating, dysuria, enuresis and flank pain.  Musculoskeletal: Negative for back pain.  Skin: Negative for rash.  Neurological: Negative for headaches.  Hematological:  Does not bruise/bleed easily.    Physical Exam Updated Vital Signs BP 117/90   Pulse 85   Temp 98.1 F (36.7 C) (Oral)   Resp 18   Ht 5\' 6"  (1.676 m)   Wt 103 kg   SpO2 99%   BMI 36.64 kg/m   Physical Exam Vitals and nursing note reviewed.  Constitutional:      General: She is not in acute distress.    Appearance: She is not ill-appearing.  HENT:     Head: Normocephalic and atraumatic.     Nose: No congestion.  Eyes:     Conjunctiva/sclera: Conjunctivae normal.  Cardiovascular:     Rate and Rhythm: Normal rate and regular rhythm.     Pulses: Normal pulses.     Heart sounds: No murmur heard. No friction rub. No gallop.   Pulmonary:     Effort: No respiratory distress.     Breath sounds: No wheezing, rhonchi or rales.  Abdominal:     Palpations: Abdomen is soft.     Tenderness: There is no abdominal tenderness. There is no right CVA tenderness or left CVA tenderness.  Musculoskeletal:     Comments: Patient spine was palpated it was tender to palpation along her lumbar spine, no deformities or crepitus present.  Patient had full range of motion, 5 5 strength, neurovascular intact in lower extremities.  Negative straight leg raise.  Skin:    General: Skin is warm and dry.  Neurological:      Mental Status: She is alert.  Psychiatric:        Mood and Affect: Mood normal.     ED Results / Procedures / Treatments   Labs (all labs ordered are listed, but only abnormal results are displayed) Labs Reviewed  BASIC METABOLIC PANEL - Abnormal; Notable for the following components:      Result Value   Creatinine, Ser 1.04 (*)    All other components within normal limits  CBC WITH DIFFERENTIAL/PLATELET - Abnormal; Notable for the following components:   WBC 10.8 (*)    Platelets 443 (*)    All other components within normal limits  URINALYSIS, ROUTINE W REFLEX MICROSCOPIC - Abnormal; Notable for the following components:   Leukocytes,Ua TRACE (*)    All other components within normal limits    EKG None  Radiology No results found.  Procedures Procedures   Medications Ordered in ED Medications - No data to display  ED Course  I have reviewed the triage vital signs and the nursing notes.  Pertinent labs & imaging results that were available during my care of the patient were reviewed by me and considered in my medical decision making (see chart for details).    MDM Rules/Calculators/A&P                         Initial impression-patient presents with lower back pain.  She is alert, does not appear in acute distress, vital signs reassuring.  Will obtain basic lab work-up, repeat UA, obtain CT renal as well as lumbar spine for further evaluation.  Work-up-CBC shows slight leukocytosis of 10.8, BMP shows a creatinine of 1.04 appears to be a baseline for patient.  UA shows trace leukocytes, no red or white blood cells present, no bacteria noted.  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  CT renal shows no acute intra-abdominal abnormalities.  Rule out-low suspicion for systemic  infection as patient is nontoxic-appearing, vital signs reassuring.  Patient does have noted leukocytosis but this is slightly elevated suspect this more of acute phase reactant.  Low suspicion for UTI, [place]o, kidney stone as UA is negative for signs of infection, no hematuria, imaging is negative.  Low suspicion for cauda equina as there is no red flag symptoms, patient has 5 out of 5 strength, neurovascular intact in lower extremities, no urinary incontinency or retention present.  Low suspicion for AAA as history is atypical, patient has low risk factors.   Plan-  1.  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  will also have her continue with the ciprofloxacin as it appears this is helping with her UTI.   will have her follow-up with PCP for further evaluation.  Vital signs have remained stable, no indication for hospital admission.  Patient discussed with attending and they agreed with assessment and plan.  Patient given at home care as well strict return precautions.  Patient verbalized that they understood agreed to said plan.   Final Clinical Impression(s) / ED Diagnoses Final diagnoses:  Tenderness of left lumbar region  Lumbar pain    Rx / DC Orders ED Discharge Orders         Ordered    predniSONE (DELTASONE) 20 MG tablet  Daily        08/17/20 1540           Aron Baba 08/17/20 1618    Milton Ferguson, MD 08/18/20 [phone]

## 2020-08-17 NOTE — Discharge Instructions (Addendum)
Your lab work looks reassuring.  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  Given you a prescription for steroids please take as prescribed.  Also want to continue with your antibiotics.  You may apply warm compresses to the area, and stretch out the back as this may help with pain.  I want you to follow-up with your primary care provider to reevaluate your UTI as well as your back.  Also like you to follow-up with neurosurgery for further evaluation of your back pain, given the contact information above please call  Come back to the emergency department if you develop chest pain, shortness of breath, severe abdominal pain, uncontrolled nausea, vomiting, diarrhea.

## 2020-08-17 NOTE — ED Triage Notes (Signed)
Pt to er, pt states that she was seen at her pmd on April 28th and treated for BV, states that she went back to her pmd on Monday and dx with a uti, states that she started having flank pain a week ago and she called her pmd who wants her to come to the er to get ruled out for pylo.

## 2020-09-09 ENCOUNTER — Other Ambulatory Visit (HOSPITAL_COMMUNITY): Payer: Self-pay | Admitting: Adult Health

## 2020-09-09 DIAGNOSIS — Z1231 Encounter for screening mammogram for malignant neoplasm of breast: Secondary | ICD-10-CM

## 2020-11-09 ENCOUNTER — Ambulatory Visit: Payer: 59 | Admitting: Cardiovascular Disease

## 2020-11-15 ENCOUNTER — Encounter: Payer: Self-pay | Admitting: Adult Health

## 2020-11-15 ENCOUNTER — Other Ambulatory Visit: Payer: Self-pay

## 2020-11-15 ENCOUNTER — Ambulatory Visit (INDEPENDENT_AMBULATORY_CARE_PROVIDER_SITE_OTHER): Payer: 59 | Admitting: Adult Health

## 2020-11-15 VITALS — BP 119/79 | HR 73 | Ht 66.25 in | Wt 232.5 lb

## 2020-11-15 DIAGNOSIS — F439 Reaction to severe stress, unspecified: Secondary | ICD-10-CM

## 2020-11-15 DIAGNOSIS — Z01419 Encounter for gynecological examination (general) (routine) without abnormal findings: Secondary | ICD-10-CM | POA: Diagnosis not present

## 2020-11-15 DIAGNOSIS — Z1211 Encounter for screening for malignant neoplasm of colon: Secondary | ICD-10-CM | POA: Diagnosis not present

## 2020-11-15 LAB — HEMOCCULT GUIAC POC 1CARD (OFFICE): Fecal Occult Blood, POC: NEGATIVE

## 2020-11-15 MED ORDER — MEGESTROL ACETATE 40 MG PO TABS: ORAL_TABLET | ORAL | 3 refills | Status: DC

## 2020-11-15 MED ORDER — VALACYCLOVIR HCL 1 G PO TABS: 1000.0000 mg | ORAL_TABLET | Freq: Every day | ORAL | 3 refills | Status: DC

## 2020-11-15 NOTE — Progress Notes (Signed)
[name], female   DOB: 1966/01/14, 55 y.o.   MRN: [id number] History of Present Illness: Summer Carpenter is a 55 year old black female,single, [phone] in for a well woman gyn exam.  Lab Results  Component Value Date   DIAGPAP  11/11/2019    - Negative for intraepithelial lesion or malignancy (NILM)   HPV NOT DETECTED 11/14/2016   [place] Negative 11/11/2019   PCP is [facility].   Current Medications, Allergies, Past Medical History, Past Surgical History, Family History and Social History were reviewed in [employer] record.     Review of Systems:  Patient denies any headaches, hearing loss, blurred vision, shortness of breath, chest pain, abdominal pain, problems with bowel movements, urination, or intercourse.(Not active). No joint pain or mood swings.  Denies any bleeding, take s megace 40 mg 1 daily. She had negative biopsy 2021.  Has stress, mom has dementia and she is working 2 jobs, and had COVID in July, has decreased energy.  Physical Exam:BP 119/79 (BP Location: Left Arm, Patient Position: Sitting, Cuff Size: Large)   Pulse 73   Ht 5' 6.25" (1.683 m)   Wt 232 lb 8 oz (105.5 kg)   BMI 37.24 kg/m   General:  Well developed, well nourished, no acute distress Skin:  Warm and dry Neck:  Midline trachea, normal thyroid, good ROM, no lymphadenopathy Lungs; Clear to auscultation bilaterally Breast:  No dominant palpable mass, retraction, or nipple discharge Cardiovascular: Regular rate and rhythm Abdomen:  Soft, non tender, no hepatosplenomegaly Pelvic:  External genitalia is normal in appearance, no lesions.  The vagina is normal in appearance. Urethra has no lesions or masses. The cervix is bulbous.  Uterus is felt to be normal size, shape, and contour.  No adnexal masses or tenderness noted.Bladder is non tender, no masses felt. Rectal: Good sphincter tone, no polyps, + hemorrhoids felt.  Hemoccult negative. Extremities/musculoskeletal:  No  swelling or varicosities noted, no clubbing or cyanosis Psych:  No mood changes, alert and cooperative,seems happy AA is 1 Fall risk is low Depression screen [facility] 2/9 11/15/2020 11/11/2019 02/26/2018  Decreased Interest 3 1 0  Down, Depressed, Hopeless '2 1 1  '$ PHQ - 2 Score '5 2 1  '$ Altered sleeping '2 1 1  '$ Tired, decreased energy '3 2 1  '$ Change in appetite 1 1 -  Feeling bad or failure about yourself  1 1 0  Trouble concentrating 1 0 0  Moving slowly or fidgety/restless 0 0 0  Suicidal thoughts 0 0 0  PHQ-9 Score '13 7 3  '$ Difficult doing work/chores - Not difficult at all Not difficult at all    GAD 7 : Generalized Anxiety Score 11/15/2020 11/11/2019  Nervous, Anxious, on Edge 2 1  Control/stop worrying 3 1  Worry too much - different things 3 1  Trouble relaxing 2 1  Restless 1 0  Easily annoyed or irritable 2 1  Afraid - awful might happen 3 1  Total GAD 7 Score 16 6  Anxiety Difficulty - Not difficult at all      Upstream - 11/15/20 0837       Pregnancy Intention Screening   Does the patient want to become pregnant in the next year? No    Does the patient's partner want to become pregnant in the next year? No    Would the patient like to discuss contraceptive options today? No      Contraception Wrap Up   Current Method No Method -  Other Reason    End Method No Method - Other Reason    Contraception Counseling Provided No            Examination chaperoned by Levy Pupa LPN.  Impression and Plan: 1. Encounter for well woman exam with routine gynecological exam Physical in 1 year Pap in 2024 Mammogram yearly,scheduled 12/08/20 Colonoscopy per GI Labs per PCP Refilled megace and valtrex Meds ordered this encounter  Medications   megestrol (MEGACE) 40 MG tablet    Sig: TAKE (1) TABLET DAILY.    Dispense:  90 tablet    Refill:  3    Order Specific Question:   Supervising Provider    Answer:   Tania Ade H [[id number]]   valACYclovir (VALTREX) 1000 MG tablet    Sig: Take 1  tablet (1,000 mg total) by mouth daily. Take 1 daily as needed    Dispense:  30 tablet    Refill:  3    Order Specific Question:   Supervising Provider    Answer:   Elonda Husky, LUTHER H [[id number]]     2. Encounter for screening fecal occult blood testing   3. Stress Talk with Mom's doctor about getting FMLA

## 2020-12-08 ENCOUNTER — Other Ambulatory Visit: Payer: Self-pay

## 2020-12-08 ENCOUNTER — Ambulatory Visit (HOSPITAL_COMMUNITY)
Admission: RE | Admit: 2020-12-08 | Discharge: 2020-12-08 | Disposition: A | Discharge status: Home / Self Care | Payer: 59 | Source: Ambulatory Visit | Attending: Adult Health | Admitting: Adult Health

## 2020-12-08 DIAGNOSIS — Z1231 Encounter for screening mammogram for malignant neoplasm of breast: Secondary | ICD-10-CM

## 2020-12-28 ENCOUNTER — Other Ambulatory Visit: Payer: Self-pay

## 2020-12-28 MED ORDER — MEGESTROL ACETATE 40 MG PO TABS: ORAL_TABLET | ORAL | 3 refills | Status: DC

## 2020-12-30 ENCOUNTER — Ambulatory Visit: Payer: 59 | Admitting: Physician Assistant

## 2021-03-22 ENCOUNTER — Encounter: Payer: Self-pay | Admitting: Cardiovascular Disease

## 2021-03-22 ENCOUNTER — Ambulatory Visit: Payer: 59 | Admitting: Cardiovascular Disease

## 2021-03-22 ENCOUNTER — Other Ambulatory Visit: Payer: Self-pay

## 2021-03-22 VITALS — BP 136/82 | HR 65 | Ht 67.0 in | Wt 228.0 lb

## 2021-03-22 DIAGNOSIS — I10 Essential (primary) hypertension: Secondary | ICD-10-CM

## 2021-03-22 DIAGNOSIS — E7800 Pure hypercholesterolemia, unspecified: Secondary | ICD-10-CM | POA: Diagnosis not present

## 2021-03-22 DIAGNOSIS — Z8639 Personal history of other endocrine, nutritional and metabolic disease: Secondary | ICD-10-CM | POA: Diagnosis not present

## 2021-03-22 DIAGNOSIS — G4733 Obstructive sleep apnea (adult) (pediatric): Secondary | ICD-10-CM

## 2021-03-22 NOTE — Patient Instructions (Signed)

## 2021-03-22 NOTE — Progress Notes (Signed)
[name], female   DOB: 27-Jan-1966, 55 y.o.   MRN: [id number]    Cardiology Office Note    Date:  03/23/2021   ID:  Summer Carpenter, DOB Apr 16, 1965, MRN [id number]  PCP:  [facility]  Cardiologist:   Sanda Klein, MD   Chief complaint: palpitations and HBP follow up   History of Present Illness:  Summer Carpenter is a 55 y.o. female with HTN and palpitations, returning for follow up.   Is done well since her last appointment.  She rarely has palpitations (previous monitoring in October 2019 and July 2021 has shown these are isolated  PVCs) that consistently occur when she is under emotional stress.  Her blood pressure is a little bit high today, but typically at home it is in the 110-120/75-80 range.  At her most recent office visit her blood pressure is 119/79 a couple of months ago.  The patient specifically denies any chest pain at rest exertion, dyspnea at rest or with exertion, orthopnea, paroxysmal nocturnal dyspnea, syncope,  focal neurological deficits, intermittent claudication, lower extremity edema, unexplained weight gain, cough, hemoptysis or wheezing.  She is taking Saxenda in an effort to lose weight and has had some success.  She is scheduled to have labs with her primary care team later today.  She is enjoying spending time at the holidays with her grandson, Summer Carpenter, when his parents bring him to visit from [facility]  Previous monitoring has not shown any evidence of serious arrhythmia. She has normal left ventricular size, wall thickness, systolic and diastolic function and no valvular abnormalities by echocardiography performed in September 2014. She is compliant with a statin for hyperlipidemia. Nebivolol was not as effective at symptom control.  Coronary disease by remote cardiac catheterization in 2003 and had no ischemic defects on the nuclear stress test in 2013.  Past Medical History:  Diagnosis Date    Anxiety    GERD (gastroesophageal reflux disease)    History of echocardiogram    a. 12/2012: EF 55-60%, no WMA, trivial TR   Hyperlipidemia    Hypertension    Obesity    Palpitations    Prediabetes    Sleep apnea     Past Surgical History:  Procedure Laterality Date   CARDIAC SURGERY     catheterization in 2002   CERVICAL POLYPECTOMY N/A 12/15/2014   Procedure: ENDOMETRIAL POLYPECTOMY;  Surgeon: Florian Buff, MD;  Location: [facility];  Service: Gynecology;  Laterality: N/A;   CHOLECYSTECTOMY     DILITATION & CURRETTAGE/HYSTROSCOPY WITH NOVASURE ABLATION N/A 12/15/2014   Procedure: DILATATION & CURETTAGE/HYSTEROSCOPY WITH ENDOMETRIAL NOVASURE ABLATION;  Surgeon: Florian Buff, MD;  Location: [facility];  Service: Gynecology;  Laterality: N/A;   DOPPLER ECHOCARDIOGRAPHY  07/30/2000 [place]   EF 55-66%,lv normal   NM MYOCAR PERF WALL MOTION  10/22/2011   OOPHORECTOMY  2008   lt    Current Outpatient Medications  Medication Sig Dispense Refill   aspirin EC 81 MG tablet Take 81 mg by mouth daily.       ferrous gluconate (FERGON) 324 MG tablet Take 324 mg by mouth daily.     Liraglutide -Weight Management (SAXENDA) 18 MG/3ML SOPN Inject into the skin daily in the afternoon.     losartan-hydrochlorothiazide (HYZAAR) 50-12.5 MG tablet TAKE 1 TABLET BY MOUTH EVERY DAY (Patient taking differently: Take 1 tablet by mouth daily.) 90 tablet 3   omeprazole (PRILOSEC) 40 MG capsule Take 40 mg  by mouth daily.     potassium chloride (K-DUR) 10 MEQ tablet TAKE 1 TABLET BY MOUTH EVERY DAY 90 tablet 0   propranolol (INDERAL) 20 MG tablet TAKE 1 TABLET BY MOUTH EVERY DAY AS NEEDED PALPITATIONS 30 tablet 6   propranolol ER (INDERAL LA) 120 MG 24 hr capsule Take 1 capsule (120 mg total) by mouth daily. (Patient taking differently: Take 120 mg by mouth daily as needed.) 90 capsule 1   sertraline (ZOLOFT) 100 MG tablet Take 100 mg by mouth every evening.     simvastatin (ZOCOR) 10 MG tablet Take 10 mg by  mouth daily.     valACYclovir (VALTREX) 1000 MG tablet Take 1 tablet (1,000 mg total) by mouth daily. Take 1 daily as needed 30 tablet 3   acetaminophen (TYLENOL) 500 MG tablet Take 1,000 mg by mouth every 6 (six) hours as needed for mild pain or moderate pain.  (Patient not taking: Reported on 03/22/2021)     aspirin 81 MG EC tablet Take 1 tablet by mouth daily.     LINZESS 145 MCG CAPS capsule Take 145 mcg by mouth daily as needed (for constipation).  (Patient not taking: Reported on 03/22/2021)  2   megestrol (MEGACE) 40 MG tablet TAKE (1) TABLET DAILY. 90 tablet 3   Multiple Vitamin (MULTIVITAMIN) tablet Take 1 tablet by mouth daily. (Patient not taking: Reported on 03/22/2021)     vitamin C (ASCORBIC ACID) 500 MG tablet Take 1 tablet by mouth every morning. (Patient not taking: Reported on 03/22/2021)     No current facility-administered medications for this visit.    Allergies:   Septra [bactrim] and Sulfamethoxazole-trimethoprim   Social History   Socioeconomic History   Marital status: Single    Spouse name: Not on file   Number of children: Not on file   Years of education: Not on file   Highest education level: Not on file  Occupational History   Not on file  Tobacco Use   Smoking status: Never   Smokeless tobacco: Never  Vaping Use   Vaping Use: Never used  Substance and Sexual Activity   Alcohol use: Yes    Comment: Occasionally, once a month   Drug use: No   Sexual activity: Not Currently    Birth control/protection: Surgical    Comment: ablation   Other Topics Concern   Not on file  Social History Narrative   Not on file   Social Determinants of Health   Financial Resource Strain: Medium Risk   Difficulty of Paying Living Expenses: Somewhat hard  Food Insecurity: No Food Insecurity   Worried About Running Out of Food in the Last Year: Never true   Ran Out of Food in the Last Year: Never true  Transportation Needs: No Transportation Needs   Lack of  Transportation (Medical): No   Lack of Transportation (Non-Medical): No  Physical Activity: Inactive   Days of Exercise per Week: 0 days   Minutes of Exercise per Session: 0 min  Stress: Stress Concern Present   Feeling of Stress : Rather much  Social Connections: Moderately Isolated   Frequency of Communication with Friends and Family: More than three times a week   Frequency of Social Gatherings with Friends and Family: Never   Attends Religious Services: More than 4 times per year   Active Member of [employer] or Organizations: No   Attends Archivist Meetings: Never   Marital Status: Never married     Family History:  The patient's family history includes Early death in her brother; Heart attack in her mother; Hypertension in her brother, brother, mother, sister, and son; Seizures in her father.   ROS:   Please see the history of present illness.    Review of Systems  All other systems reviewed and are negative.All other systems are reviewed and are negative.   PHYSICAL EXAM:   VS:  BP 136/82 (BP Location: Left Arm, Patient Position: Sitting, Cuff Size: Large)    Pulse 65    Ht 5\' 7"  (1.702 m)    Wt 228 lb (103.4 kg)    SpO2 98%    BMI 35.71 kg/m      General: Alert, oriented x3, no distress, severely obese Head: no evidence of trauma, PERRL, EOMI, no exophtalmos or lid lag, no myxedema, no xanthelasma; normal ears, nose and oropharynx Neck: normal jugular venous pulsations and no hepatojugular reflux; brisk carotid pulses without delay and no carotid bruits Chest: clear to auscultation, no signs of consolidation by percussion or palpation, normal fremitus, symmetrical and full respiratory excursions Cardiovascular: normal position and quality of the apical impulse, regular rhythm, normal first and second heart sounds, no murmurs, rubs or gallops Abdomen: no tenderness or distention, no masses by palpation, no abnormal pulsatility or arterial bruits, normal bowel sounds, no  hepatosplenomegaly Extremities: no clubbing, cyanosis or edema; 2+ radial, ulnar and brachial pulses bilaterally; 2+ right femoral, posterior tibial and dorsalis pedis pulses; 2+ left femoral, posterior tibial and dorsalis pedis pulses; no subclavian or femoral bruits Neurological: grossly nonfocal Psych: Normal mood and affect    Wt Readings from Last 3 Encounters:  03/22/21 228 lb (103.4 kg)  11/15/20 232 lb 8 oz (105.5 kg)  08/17/20 227 lb (103 kg)      Studies/Labs Reviewed:   EKG:  EKG is ordered today. It shows NSR,  incomplete RBBB, QRS 100 ms, normal repol, QTc 435 ms.  Recent Labs: 08/17/2020: BUN 9; Creatinine, Ser 1.04; Hemoglobin 14.8; Platelets 443; Potassium 4.0; Sodium 135    ASSESSMENT:    1. Essential hypertension   2. History of hypokalemia   3. Hypercholesterolemia   4. Severe obesity (BMI 35.0-39.9) with comorbidity ([place])   5. mild OSA (obstructive sleep apnea)      PLAN:  In order of problems listed above:  HTN: Well-controlled.  We were never really able to control her blood pressure unless we used at least a small dose of thiazide diuretic. Hypokalemia: Often precipitates palpitations.  Not a problem recently.  Labs in May showed a potassium of 4.0, she is scheduled to have repeat labs today with family practice.  Asked her to make sure they send me a copy. HLP: On statin, labs will be checked later today by PCP. Obesity: Remains severely obese but trying to lose weight by eating better and now on Saxenda.  Down almost 20 pounds from her maximum weight of 247 pounds in July 2021. OSA: Sleep study showed that she has only mild OSA, not meeting criteria for CPAP.  Currently asymptomatic, denies daytime hypersomnolence.  Medication Adjustments/Labs and Tests Ordered: Current medicines are reviewed at length with the patient today.  Concerns regarding medicines are outlined above.  Medication changes, Labs and Tests ordered today are listed below. Patient  Instructions  Medication Instructions:  No changes *If you need a refill on your cardiac medications before your next appointment, please call your pharmacy*   Lab Work: None ordered If you have labs (blood work) drawn today and  your tests are completely normal, you will receive your results only by: MyChart Message (if you have MyChart) OR A paper copy in the mail If you have any lab test that is abnormal or we need to change your treatment, we will call you to review the results.   Testing/Procedures: None ordered   Follow-Up: At [facility], you and your health needs are our priority.  As part of our continuing mission to provide you with exceptional heart care, we have created designated Provider Care Teams.  These Care Teams include your primary Cardiologist (physician) and Advanced Practice Providers (APPs -  Physician Assistants and Nurse Practitioners) who all work together to provide you with the care you need, when you need it.  We recommend signing up for the patient portal called "MyChart".  Sign up information is provided on this After Visit Summary.  MyChart is used to connect with patients for Virtual Visits (Telemedicine).  Patients are able to view lab/test results, encounter notes, upcoming appointments, etc.  Non-urgent messages can be sent to your provider as well.   To learn more about what you can do with MyChart, go to [email or web address].    Your next appointment:   12 month(s)  The format for your next appointment:   In Person  Provider:   Sanda Klein, MD       Signed, Sanda Klein, MD  03/23/2021 3:17 PM    [facility] [address], [place], [place]  [address] Phone: [phone]; Fax: [phone]

## 2021-03-23 MED ORDER — LOSARTAN POTASSIUM-HCTZ 50-12.5 MG PO TABS: 1.0000 | ORAL_TABLET | Freq: Every day | ORAL | 3 refills | Status: DC

## 2021-07-24 ENCOUNTER — Encounter (HOSPITAL_COMMUNITY): Payer: Self-pay | Admitting: *Deleted

## 2021-07-24 ENCOUNTER — Emergency Department (HOSPITAL_COMMUNITY)
Admission: EM | Admit: 2021-07-24 | Discharge: 2021-07-24 | Disposition: A | Discharge status: Home / Self Care | Payer: 59 | Attending: Emergency Medicine | Admitting: Emergency Medicine

## 2021-07-24 ENCOUNTER — Other Ambulatory Visit: Payer: Self-pay

## 2021-07-24 DIAGNOSIS — S61211A Laceration without foreign body of left index finger without damage to nail, initial encounter: Secondary | ICD-10-CM | POA: Diagnosis not present

## 2021-07-24 DIAGNOSIS — Z23 Encounter for immunization: Secondary | ICD-10-CM | POA: Diagnosis not present

## 2021-07-24 DIAGNOSIS — Z79899 Other long term (current) drug therapy: Secondary | ICD-10-CM | POA: Insufficient documentation

## 2021-07-24 DIAGNOSIS — S6992XA Unspecified injury of left wrist, hand and finger(s), initial encounter: Secondary | ICD-10-CM

## 2021-07-24 DIAGNOSIS — Z7982 Long term (current) use of aspirin: Secondary | ICD-10-CM | POA: Insufficient documentation

## 2021-07-24 DIAGNOSIS — W260XXA Contact with knife, initial encounter: Secondary | ICD-10-CM | POA: Diagnosis not present

## 2021-07-24 MED ORDER — TETANUS-DIPHTH-ACELL PERTUSSIS 5-2.5-18.5 LF-MCG/0.5 IM SUSY
0.5000 mL | PREFILLED_SYRINGE | Freq: Once | INTRAMUSCULAR | Status: AC
  Administered 2021-07-24: 0.5 mL via INTRAMUSCULAR
  Filled 2021-07-24: qty 0.5

## 2021-07-24 MED ORDER — BACITRACIN ZINC 500 UNIT/GM EX OINT
TOPICAL_OINTMENT | Freq: Two times a day (BID) | CUTANEOUS | Status: DC
  Filled 2021-07-24: qty 0.9

## 2021-07-24 NOTE — ED Provider Notes (Signed)
?[facility] ?Provider Note ? ? ?CSN: [id number] ?Arrival date & time: 07/24/21  3382 ? ?  ? ?History ? ?Chief Complaint  ?Patient presents with  ? Finger Injury  ? ? ?Vegas SACOYA MCGOURTY is a 56 y.o. female. ? ?HPI ? ?  ?56 year old female comes in with chief complaint of finger injury.  Patient indicates that she was cleaning her knife when she accidentally cut her left index finger.  The injury occurred around 10 PM yesterday.  She was having bleeding most likely overnight, as her bed had fair amount of blood stains on it.  She applied Band-Aid and came to the ER for further assessment. ? ?Patient is not on any blood thinners and denies any immunosuppression or diabetes history.  She denies any numbness or tingling.  Unknown tetanus status. ? ? ?Home Medications ?Prior to Admission medications   ?Medication Sig Start Date End Date Taking? Authorizing Provider  ?acetaminophen (TYLENOL) 500 MG tablet Take 1,000 mg by mouth every 6 (six) hours as needed for mild pain or moderate pain.  ?Patient not taking: Reported on 03/22/2021    [provider]  ?aspirin EC 81 MG tablet Take 81 mg by mouth daily.      [provider]  ?ferrous gluconate (FERGON) 324 MG tablet Take 324 mg by mouth daily. 03/28/18   [provider]  ?Rolan Lipa 145 MCG CAPS capsule Take 145 mcg by mouth daily as needed (for constipation).  ?Patient not taking: Reported on 03/22/2021 02/21/18   [provider]  ?Liraglutide -Weight Management (SAXENDA) 18 MG/3ML SOPN Inject into the skin daily in the afternoon.    [provider]  ?losartan-hydrochlorothiazide (HYZAAR) 50-12.5 MG tablet Take 1 tablet by mouth daily. 03/23/21   Croitoru, Mihai, MD  ?Multiple Vitamin (MULTIVITAMIN) tablet Take 1 tablet by mouth daily. ?Patient not taking: Reported on 03/22/2021    [provider]  ?omeprazole (PRILOSEC) 40 MG capsule Take 40 mg by mouth daily. 09/30/19   [provider]   ?potassium chloride (K-DUR) 10 MEQ tablet TAKE 1 TABLET BY MOUTH EVERY DAY 08/04/18   Croitoru, Mihai, MD  ?propranolol (INDERAL) 20 MG tablet TAKE 1 TABLET BY MOUTH EVERY DAY AS NEEDED PALPITATIONS 06/08/20   Croitoru, Dani Gobble, MD  ?propranolol ER (INDERAL LA) 120 MG 24 hr capsule Take 1 capsule (120 mg total) by mouth daily. ?Patient taking differently: Take 120 mg by mouth daily as needed. 05/04/20   Croitoru, Mihai, MD  ?sertraline (ZOLOFT) 100 MG tablet Take 100 mg by mouth every evening. 03/27/18   [provider]  ?simvastatin (ZOCOR) 10 MG tablet Take 10 mg by mouth daily.    [provider]  ?valACYclovir (VALTREX) 1000 MG tablet Take 1 tablet (1,000 mg total) by mouth daily. Take 1 daily as needed 11/15/20   Estill Dooms, NP  ?vitamin C (ASCORBIC ACID) 500 MG tablet Take 1 tablet by mouth every morning. ?Patient not taking: Reported on 03/22/2021 03/28/18   [provider]  ?   ? ?Allergies    ?Septra [bactrim] and Sulfamethoxazole-trimethoprim   ? ?Review of Systems   ?Review of Systems ? ?Physical Exam ?Updated Vital Signs ?BP (!) 151/105 (BP Location: Left Arm)   Pulse 67   Temp 97.9 ?F (36.6 ?C) (Oral)   Resp 16   Ht '5\' 7"'$  (1.702 m)   Wt 105.2 kg   SpO2 100%   BMI 36.34 kg/m?  ?Physical Exam ?Vitals and nursing note reviewed.  ?Skin: ?  Comments: Patient has sloughed off about 2 mm x 5 mm linear area over the volar aspect of the left index finger.  No active bleeding at this time.  Able to flex and extend over the IP joints of the affected finger.  No nail injury.  ?Neurological:  ?   Mental Status: She is alert.  ? ? ?ED Results / Procedures / Treatments   ?Labs ?(all labs ordered are listed, but only abnormal results are displayed) ?Labs Reviewed - No data to display ? ?EKG ?None ? ?Radiology ?No results found. ? ?Procedures ?Procedures  ? ? ?Medications Ordered in ED ?Medications  ?Tdap (BOOSTRIX) injection 0.5 mL (has no administration in time range)  ?bacitracin  ointment (has no administration in time range)  ? ? ?ED Course/ Medical Decision Making/ A&P ?  ?                        ?Medical Decision Making ?56 year old female comes in with chief complaint of finger laceration. ? ?It appears that patient has amputated small area of skin in a linear fashion -not amenable to suture repair, additionally the injury had occurred more than 10 hours ago, so not an ideal candidate for repair in first place. ? ?Fortunately, it is a very small area that is involved.  The injury should heal on its own.  Wound care precautions discussed.  Tetanus to be updated.  Patient is neurovascularly intact. ? ?Imaging considered, but based on the exam, we do not think there is any underlying foreign body or fracture to be concerned about.  Therefore x-ray will not be ordered. ? ?Problems Addressed: ?Injury of tip of finger of left hand, initial encounter: self-limited or minor problem ? ?Risk ?OTC drugs. ?Prescription drug management. ? ? ? ?Final Clinical Impression(s) / ED Diagnoses ?Final diagnoses:  ?Injury of tip of finger of left hand, initial encounter  ? ? ?Rx / DC Orders ?ED Discharge Orders   ? ? None  ? ?  ? ? ?  ?Varney Biles, MD ?07/24/21 [phone] ? ?

## 2021-07-24 NOTE — Discharge Instructions (Signed)
You are seen in the ER after your finger injury. ? ?Unfortunately, we cannot repair the wound.  Given that the wound was contaminated, and the skin barrier is compromised, there is higher risk for infection. ? ?Please wash the finger with soap and water at least once a day, followed by drying it.  Apply bacitracin ointment twice a day and keep the finger covered at all times. ? ?Return to the ER if you start having swelling to the finger, wrist and significant pain. ?

## 2021-07-24 NOTE — ED Triage Notes (Signed)
Pt was wiping a knife off last night with a napkin and cut her left index finger around 2230. Pt reports she woke up this morning with blood all in her bed and was concerned she may need stitches. Denies use of blood thinners. Bleeding controlled at this time.  ?

## 2021-08-05 DIAGNOSIS — M79661 Pain in right lower leg: Secondary | ICD-10-CM | POA: Insufficient documentation

## 2021-08-05 DIAGNOSIS — Z7982 Long term (current) use of aspirin: Secondary | ICD-10-CM | POA: Diagnosis not present

## 2021-08-05 DIAGNOSIS — Z79899 Other long term (current) drug therapy: Secondary | ICD-10-CM | POA: Diagnosis not present

## 2021-08-05 DIAGNOSIS — I10 Essential (primary) hypertension: Secondary | ICD-10-CM | POA: Diagnosis not present

## 2021-08-05 DIAGNOSIS — M545 Low back pain, unspecified: Secondary | ICD-10-CM | POA: Insufficient documentation

## 2021-08-06 ENCOUNTER — Other Ambulatory Visit: Payer: Self-pay

## 2021-08-06 ENCOUNTER — Emergency Department (HOSPITAL_COMMUNITY)
Admission: EM | Admit: 2021-08-06 | Discharge: 2021-08-06 | Disposition: A | Discharge status: Home / Self Care | Payer: 59 | Attending: Emergency Medicine | Admitting: Emergency Medicine

## 2021-08-06 ENCOUNTER — Encounter (HOSPITAL_COMMUNITY): Payer: Self-pay

## 2021-08-06 DIAGNOSIS — M5410 Radiculopathy, site unspecified: Secondary | ICD-10-CM

## 2021-08-06 MED ORDER — PREDNISONE 20 MG PO TABS
40.0000 mg | ORAL_TABLET | Freq: Once | ORAL | Status: AC
  Administered 2021-08-06: 40 mg via ORAL
  Filled 2021-08-06: qty 2

## 2021-08-06 MED ORDER — PREDNISONE 10 MG PO TABS: 20.0000 mg | ORAL_TABLET | Freq: Two times a day (BID) | ORAL | 0 refills | Status: DC

## 2021-08-06 MED ORDER — HYDROCODONE-ACETAMINOPHEN 5-325 MG PO TABS: 1.0000 | ORAL_TABLET | Freq: Four times a day (QID) | ORAL | 0 refills | Status: DC | PRN

## 2021-08-06 NOTE — ED Triage Notes (Signed)
Pt arrived via POV c/o on-going lower right back pain traveling down right leg. Pt reports she possibly could have pulled something or injured something. Pain is not relieved with OTC medication PTA. Pt has tried [employer] at [facility].  ?

## 2021-08-06 NOTE — ED Provider Notes (Signed)
?[place] ?Provider Note ? ? ?CSN: [id number] ?Arrival date & time: 08/05/21  2306 ? ?  ? ?History ? ?Chief Complaint  ?Patient presents with  ? Leg Pain  ? ? ?Summer Carpenter is a 56 y.o. female. ? ?Patient is a 56 year old female with past medical history of hypertension and hyperlipidemia.  Patient presenting today with complaints of back pain.  She describes pain in her low back for the past several days with radiation and pain in her right lower leg.  She denies any bowel or bladder complaints.  She denies any weakness. ? ?The history is provided by the patient.  ? ?  ? ?Home Medications ?Prior to Admission medications   ?Medication Sig Start Date End Date Taking? Authorizing Provider  ?acetaminophen (TYLENOL) 500 MG tablet Take 1,000 mg by mouth every 6 (six) hours as needed for mild pain or moderate pain.  ?Patient not taking: Reported on 03/22/2021    [provider]  ?aspirin EC 81 MG tablet Take 81 mg by mouth daily.      [provider]  ?ferrous gluconate (FERGON) 324 MG tablet Take 324 mg by mouth daily. 03/28/18   [provider]  ?Rolan Lipa 145 MCG CAPS capsule Take 145 mcg by mouth daily as needed (for constipation).  ?Patient not taking: Reported on 03/22/2021 02/21/18   [provider]  ?Liraglutide -Weight Management (SAXENDA) 18 MG/3ML SOPN Inject into the skin daily in the afternoon.    [provider]  ?losartan-hydrochlorothiazide (HYZAAR) 50-12.5 MG tablet Take 1 tablet by mouth daily. 03/23/21   Croitoru, Mihai, MD  ?Multiple Vitamin (MULTIVITAMIN) tablet Take 1 tablet by mouth daily. ?Patient not taking: Reported on 03/22/2021    [provider]  ?omeprazole (PRILOSEC) 40 MG capsule Take 40 mg by mouth daily. 09/30/19   [provider]  ?potassium chloride (K-DUR) 10 MEQ tablet TAKE 1 TABLET BY MOUTH EVERY DAY 08/04/18   Croitoru, Mihai, MD  ?propranolol (INDERAL) 20 MG tablet TAKE 1 TABLET BY MOUTH EVERY  DAY AS NEEDED PALPITATIONS 06/08/20   Croitoru, Dani Gobble, MD  ?propranolol ER (INDERAL LA) 120 MG 24 hr capsule Take 1 capsule (120 mg total) by mouth daily. ?Patient taking differently: Take 120 mg by mouth daily as needed. 05/04/20   Croitoru, Mihai, MD  ?sertraline (ZOLOFT) 100 MG tablet Take 100 mg by mouth every evening. 03/27/18   [provider]  ?simvastatin (ZOCOR) 10 MG tablet Take 10 mg by mouth daily.    [provider]  ?valACYclovir (VALTREX) 1000 MG tablet Take 1 tablet (1,000 mg total) by mouth daily. Take 1 daily as needed 11/15/20   Estill Dooms, NP  ?vitamin C (ASCORBIC ACID) 500 MG tablet Take 1 tablet by mouth every morning. ?Patient not taking: Reported on 03/22/2021 03/28/18   [provider]  ?   ? ?Allergies    ?Septra [bactrim] and Sulfamethoxazole-trimethoprim   ? ?Review of Systems   ?Review of Systems  ?All other systems reviewed and are negative. ? ?Physical Exam ?Updated Vital Signs ?BP (!) 146/81 (BP Location: Right Arm)   Pulse 69   Temp 98 ?F (36.7 ?C) (Oral)   Resp 16   Ht '5\' 7"'$  (1.702 m)   Wt 105.2 kg   SpO2 100%   BMI 36.32 kg/m?  ?Physical Exam ?Vitals and nursing note reviewed.  ?Constitutional:   ?   General: She is not in acute distress. ?   Appearance: Normal appearance. She is not  ill-appearing.  ?HENT:  ?   Head: Normocephalic and atraumatic.  ?Pulmonary:  ?   Effort: Pulmonary effort is normal.  ?Musculoskeletal:  ?   Comments: There is tenderness to palpation in the soft tissues of the right lower lumbar region.  The right lower extremity is grossly normal in appearance with good pulses.  ?Skin: ?   General: Skin is warm and dry.  ?Neurological:  ?   General: No focal deficit present.  ?   Mental Status: She is alert and oriented to person, place, and time.  ?   Comments: DTRs are 3+ and symmetrical in the bilateral lower extremities.  Strength is 5 out of 5 in both lower extremities.  She is able to ambulate on heels and toes without  difficulty.  ? ? ?ED Results / Procedures / Treatments   ?Labs ?(all labs ordered are listed, but only abnormal results are displayed) ?Labs Reviewed - No data to display ? ?EKG ?None ? ?Radiology ?No results found. ? ?Procedures ?Procedures  ? ? ?Medications Ordered in ED ?Medications  ?predniSONE (DELTASONE) tablet 40 mg (has no administration in time range)  ? ? ?ED Course/ Medical Decision Making/ A&P ? ?Patient presenting today with complaints of low back pain radiating into her right leg.  This has been worsening over the past several days.  Is neurologically intact with no focal deficits.  There are no red flags that would suggest an emergent situation.  I feels the patient can be treated with prednisone and pain medicine and follow-up with primary doctor if not improving. ? ?Final Clinical Impression(s) / ED Diagnoses ?Final diagnoses:  ?None  ? ? ?Rx / DC Orders ?ED Discharge Orders   ? ? None  ? ?  ? ? ?  ?Veryl Speak, MD ?08/06/21 0119 ? ?

## 2021-08-06 NOTE — Discharge Instructions (Signed)
Begin taking prednisone as prescribed. ? ?Begin taking hydrocodone as prescribed as needed for pain. ? ?Follow-up with primary doctor if not improving in the next 1 to 2 weeks. ?

## 2021-08-06 NOTE — ED Notes (Signed)
ED Provider at bedside. 

## 2021-10-17 ENCOUNTER — Other Ambulatory Visit: Payer: Self-pay | Admitting: Podiatry

## 2021-10-23 ENCOUNTER — Encounter (HOSPITAL_COMMUNITY): Payer: Self-pay

## 2021-10-23 ENCOUNTER — Emergency Department (HOSPITAL_COMMUNITY)
Admission: EM | Admit: 2021-10-23 | Discharge: 2021-10-23 | Disposition: A | Discharge status: Home / Self Care | Payer: 59 | Attending: Emergency Medicine | Admitting: Emergency Medicine

## 2021-10-23 ENCOUNTER — Emergency Department (HOSPITAL_COMMUNITY): Payer: 59

## 2021-10-23 DIAGNOSIS — Z79899 Other long term (current) drug therapy: Secondary | ICD-10-CM | POA: Diagnosis not present

## 2021-10-23 DIAGNOSIS — I10 Essential (primary) hypertension: Secondary | ICD-10-CM | POA: Insufficient documentation

## 2021-10-23 DIAGNOSIS — Z7982 Long term (current) use of aspirin: Secondary | ICD-10-CM | POA: Diagnosis not present

## 2021-10-23 DIAGNOSIS — M5440 Lumbago with sciatica, unspecified side: Secondary | ICD-10-CM | POA: Diagnosis not present

## 2021-10-23 DIAGNOSIS — M545 Low back pain, unspecified: Secondary | ICD-10-CM | POA: Diagnosis present

## 2021-10-23 LAB — URINALYSIS, ROUTINE W REFLEX MICROSCOPIC
Glucose, UA: NEGATIVE mg/dL
Hgb urine dipstick: NEGATIVE
Ketones, ur: NEGATIVE mg/dL
Leukocytes,Ua: NEGATIVE
Nitrite: NEGATIVE
Protein, ur: NEGATIVE mg/dL
Specific Gravity, Urine: 1.025 (ref 1.005–1.030)
pH: 5 (ref 5.0–8.0)

## 2021-10-23 LAB — CBG MONITORING, ED: Glucose-Capillary: 97 mg/dL (ref 70–99)

## 2021-10-23 MED ORDER — OXYCODONE-ACETAMINOPHEN 5-325 MG PO TABS: 1.0000 | ORAL_TABLET | Freq: Once | ORAL | Status: DC

## 2021-10-23 MED ORDER — KETOROLAC TROMETHAMINE 30 MG/ML IJ SOLN
30.0000 mg | Freq: Once | INTRAMUSCULAR | Status: AC
  Administered 2021-10-23: 30 mg via INTRAMUSCULAR
  Filled 2021-10-23: qty 1

## 2021-10-23 MED ORDER — OXYCODONE-ACETAMINOPHEN 5-325 MG PO TABS: 1.0000 | ORAL_TABLET | Freq: Four times a day (QID) | ORAL | 0 refills | Status: DC | PRN

## 2021-10-23 MED ORDER — PREDNISONE 10 MG PO TABS: 20.0000 mg | ORAL_TABLET | Freq: Every day | ORAL | 0 refills | Status: DC

## 2021-10-23 NOTE — ED Triage Notes (Signed)
Pt describes mid and lower back pain that started approximately three days ago after strenuous work. Pt denies urinary/bowel incontinence. No saddle anesthesia. Pt states pain radiates down both legs.

## 2021-10-23 NOTE — Discharge Instructions (Signed)
Follow-up with your spine specialist in the next 2 to 3 weeks.

## 2021-10-23 NOTE — ED Provider Notes (Signed)
[place] Provider Note   CSN: [id number] Arrival date & time: 10/23/21  8563     History  Chief Complaint  Patient presents with   Back Pain    Summer Carpenter is a 56 y.o. female.  Patient complains of lower back pain radiation down right leg.  Patient has a history of hypertension  The history is provided by the patient and medical records. No language interpreter was used.  Back Pain Location:  Lumbar spine Radiates to:  Does not radiate Pain severity:  Moderate Pain is:  Worse during the day Onset quality:  Sudden Timing:  Constant Progression:  Worsening Chronicity:  Recurrent Context: not emotional stress   Associated symptoms: no abdominal pain, no chest pain and no headaches        Home Medications Prior to Admission medications   Medication Sig Start Date End Date Taking? Authorizing Provider  oxyCODONE-acetaminophen (PERCOCET/ROXICET) 5-325 MG tablet Take 1 tablet by mouth every 6 (six) hours as needed for severe pain. 10/23/21  Yes Milton Ferguson, MD  predniSONE (DELTASONE) 10 MG tablet Take 2 tablets (20 mg total) by mouth daily. 10/23/21  Yes Milton Ferguson, MD  acetaminophen (TYLENOL) 500 MG tablet Take 1,000 mg by mouth every 6 (six) hours as needed for mild pain or moderate pain.  Patient not taking: Reported on 03/22/2021    [provider]  aspirin EC 81 MG tablet Take 81 mg by mouth daily.      [provider]  ferrous gluconate (FERGON) 324 MG tablet Take 324 mg by mouth daily. 03/28/18   [provider]  HYDROcodone-acetaminophen (NORCO) 5-325 MG tablet Take 1-2 tablets by mouth every 6 (six) hours as needed. 08/06/21   Veryl Speak, MD  LINZESS 145 MCG CAPS capsule Take 145 mcg by mouth daily as needed (for constipation).  Patient not taking: Reported on 03/22/2021 02/21/18   [provider]  Liraglutide -Weight Management (SAXENDA) 18 MG/3ML SOPN Inject into the skin daily in the  afternoon.    [provider]  losartan-hydrochlorothiazide (HYZAAR) 50-12.5 MG tablet Take 1 tablet by mouth daily. 03/23/21   Croitoru, Mihai, MD  Multiple Vitamin (MULTIVITAMIN) tablet Take 1 tablet by mouth daily. Patient not taking: Reported on 03/22/2021    [provider]  omeprazole (PRILOSEC) 40 MG capsule Take 40 mg by mouth daily. 09/30/19   [provider]  potassium chloride (K-DUR) 10 MEQ tablet TAKE 1 TABLET BY MOUTH EVERY DAY 08/04/18   Croitoru, Mihai, MD  propranolol (INDERAL) 20 MG tablet TAKE 1 TABLET BY MOUTH EVERY DAY AS NEEDED PALPITATIONS 06/08/20   Croitoru, Dani Gobble, MD  propranolol ER (INDERAL LA) 120 MG 24 hr capsule Take 1 capsule (120 mg total) by mouth daily. Patient taking differently: Take 120 mg by mouth daily as needed. 05/04/20   Croitoru, Mihai, MD  sertraline (ZOLOFT) 100 MG tablet Take 100 mg by mouth every evening. 03/27/18   [provider]  simvastatin (ZOCOR) 10 MG tablet Take 10 mg by mouth daily.    [provider]  valACYclovir (VALTREX) 1000 MG tablet Take 1 tablet (1,000 mg total) by mouth daily. Take 1 daily as needed 11/15/20   Derrek Monaco A, NP  vitamin C (ASCORBIC ACID) 500 MG tablet Take 1 tablet by mouth every morning. Patient not taking: Reported on 03/22/2021 03/28/18   [provider]      Allergies    Septra [bactrim] and Sulfamethoxazole-trimethoprim    Review of Systems  Review of Systems  Constitutional:  Negative for appetite change and fatigue.  HENT:  Negative for congestion, ear discharge and sinus pressure.   Eyes:  Negative for discharge.  Respiratory:  Negative for cough.   Cardiovascular:  Negative for chest pain.  Gastrointestinal:  Negative for abdominal pain and diarrhea.  Genitourinary:  Negative for frequency and hematuria.  Musculoskeletal:  Positive for back pain.  Skin:  Negative for rash.  Neurological:  Negative for seizures and headaches.   Psychiatric/Behavioral:  Negative for hallucinations.     Physical Exam Updated Vital Signs BP 136/83   Pulse 67   Temp 97.8 F (36.6 C) (Oral)   Resp 14   LMP 09/28/2019   SpO2 96%  Physical Exam Vitals and nursing note reviewed.  Constitutional:      Appearance: She is well-developed.  HENT:     Head: Normocephalic.     Nose: Nose normal.  Eyes:     General: No scleral icterus.    Conjunctiva/sclera: Conjunctivae normal.  Neck:     Thyroid: No thyromegaly.  Cardiovascular:     Rate and Rhythm: Normal rate and regular rhythm.     Heart sounds: No murmur heard.    No friction rub. No gallop.  Pulmonary:     Breath sounds: No stridor. No wheezing or rales.  Chest:     Chest wall: No tenderness.  Abdominal:     General: There is no distension.     Tenderness: There is no abdominal tenderness. There is no rebound.  Musculoskeletal:        General: Normal range of motion.     Cervical back: Neck supple.     Comments: Tenderness lower back with straight leg raise left positive  Lymphadenopathy:     Cervical: No cervical adenopathy.  Skin:    Findings: No erythema or rash.  Neurological:     Mental Status: She is alert and oriented to person, place, and time.     Motor: No abnormal muscle tone.     Coordination: Coordination normal.  Psychiatric:        Behavior: Behavior normal.     ED Results / Procedures / Treatments   Labs (all labs ordered are listed, but only abnormal results are displayed) Labs Reviewed  URINALYSIS, ROUTINE W REFLEX MICROSCOPIC - Abnormal; Notable for the following components:      Result Value   Bilirubin Urine SMALL (*)    All other components within normal limits  CBG MONITORING, ED    EKG None  Radiology DG Lumbar Spine Complete  Result Date: 10/23/2021 CLINICAL DATA:  Back pain. EXAM: LUMBAR SPINE - COMPLETE 4+ VIEW COMPARISON:  CT lumbar spine 08/17/2020 FINDINGS: No evidence of fracture. No subluxation. Loss of disc  height noted L2-3, L3-4, and L5-S1 with endplate degeneration. Facets are well aligned bilaterally. SI joints unremarkable. 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 Electronically Signed   By: Misty Stanley M.D.   On: 10/23/2021 07:35    Procedures Procedures    Medications Ordered in ED Medications  oxyCODONE-acetaminophen (PERCOCET/ROXICET) 5-325 MG per tablet 1 tablet (0 tablets Oral Hold 10/23/21 0754)  ketorolac (TORADOL) 30 MG/ML injection 30 mg (30 mg Intramuscular Given 10/23/21 0749)    ED Course/ Medical Decision Making/ A&P                           Medical Decision Making Amount and/or Complexity of  Data Reviewed Labs: ordered. Radiology: ordered.  Risk Prescription drug management.  This patient presents to the ED for concern of back pain, this involves an extensive number of treatment options, and is a complaint that carries with it a high risk of complications and morbidity.  The differential diagnosis includes kidney stone, musculoskeletal pain   Co morbidities that complicate the patient evaluation  Hypertension   Additional history obtained:  Additional history obtained from patient External records from outside source obtained and reviewed including hospital records   Lab Tests:  I Ordered, and personally interpreted labs.  The pertinent results include: UA negative   Imaging Studies ordered:  I ordered imaging studies including lumbar spine I independently visualized and interpreted imaging which showed cancer disease I agree with the radiologist interpretation   Cardiac Monitoring: / EKG:  The patient was maintained on a cardiac monitor.  I personally viewed and interpreted the cardiac monitored which showed an underlying rhythm of: Normal sinus rhythm   Consultations Obtained:  No consultant Problem List / ED Course / Critical interventions / Medication management  Hypertension back pain I ordered  medication including Percocet for pain Reevaluation of the patient after these medicines showed that the patient improved I have reviewed the patients home medicines and have made adjustments as needed   Social Determinants of Health:  None   Test / Admission - Considered:  Consider MRI as outpatient  Patient with low back pain with sciatica.  She is put on prednisone and Percocet and will follow-up with her back doctor        Final Clinical Impression(s) / ED Diagnoses Final diagnoses:  Acute right-sided low back pain with sciatica, sciatica laterality unspecified    Rx / DC Orders ED Discharge Orders          Ordered    predniSONE (DELTASONE) 10 MG tablet  Daily        10/23/21 1000    oxyCODONE-acetaminophen (PERCOCET/ROXICET) 5-325 MG tablet  Every 6 hours PRN        10/23/21 1000              Milton Ferguson, MD 10/26/21 1113

## 2021-10-31 ENCOUNTER — Encounter: Payer: Self-pay | Admitting: Podiatry

## 2021-11-02 NOTE — OR Nursing (Signed)
Pt asked to hold Sunday (7/30) Semaglutide injection per Dr Barrie Folk.

## 2021-11-06 NOTE — Discharge Instructions (Signed)
[facility]  POST OPERATIVE INSTRUCTIONS FOR DR. FOWLER AND [facility] POD[facility]   Take your medication as prescribed.  Pain medication should be taken only as needed.  Keep the dressing clean, dry and intact.  Keep your foot elevated above the heart level for the first 48 hours.  Walking to the bathroom and brief periods of walking are acceptable, unless we have instructed you to be non-weight bearing.  Always wear your post-op shoe when walking.  Always use your crutches if you are to be non-weight bearing.  Do not take a shower. Baths are permissible as long as the foot is kept out of the water.   Every hour you are awake:  Bend your knee 15 times. Flex foot 15 times Massage calf 15 times  Call [facility] ([phone]) if any of the following problems occur: You develop a temperature or fever. The bandage becomes saturated with blood. Medication does not stop your pain. Injury of the foot occurs. Any symptoms of infection including redness, odor, or red streaks running from wound. 

## 2021-11-08 ENCOUNTER — Ambulatory Visit: Payer: 59 | Admitting: Anesthesiology

## 2021-11-08 ENCOUNTER — Ambulatory Visit
Admission: RE | Admit: 2021-11-08 | Discharge: 2021-11-08 | Disposition: A | Discharge status: Home / Self Care | Payer: 59 | Attending: Podiatry | Admitting: Podiatry

## 2021-11-08 ENCOUNTER — Encounter
Admission: RE | Disposition: A | Discharge status: Home / Self Care | Payer: Self-pay | Source: Home / Self Care | Attending: Podiatry

## 2021-11-08 ENCOUNTER — Ambulatory Visit: Payer: Self-pay

## 2021-11-08 ENCOUNTER — Encounter: Payer: Self-pay | Admitting: Podiatry

## 2021-11-08 ENCOUNTER — Other Ambulatory Visit: Payer: Self-pay

## 2021-11-08 DIAGNOSIS — M19071 Primary osteoarthritis, right ankle and foot: Secondary | ICD-10-CM | POA: Insufficient documentation

## 2021-11-08 DIAGNOSIS — D759 Disease of blood and blood-forming organs, unspecified: Secondary | ICD-10-CM | POA: Diagnosis not present

## 2021-11-08 DIAGNOSIS — K219 Gastro-esophageal reflux disease without esophagitis: Secondary | ICD-10-CM | POA: Insufficient documentation

## 2021-11-08 DIAGNOSIS — I10 Essential (primary) hypertension: Secondary | ICD-10-CM | POA: Diagnosis not present

## 2021-11-08 DIAGNOSIS — F419 Anxiety disorder, unspecified: Secondary | ICD-10-CM | POA: Insufficient documentation

## 2021-11-08 DIAGNOSIS — M2011 Hallux valgus (acquired), right foot: Secondary | ICD-10-CM | POA: Insufficient documentation

## 2021-11-08 DIAGNOSIS — G4730 Sleep apnea, unspecified: Secondary | ICD-10-CM | POA: Insufficient documentation

## 2021-11-08 DIAGNOSIS — D649 Anemia, unspecified: Secondary | ICD-10-CM | POA: Diagnosis not present

## 2021-11-08 HISTORY — PX: BUNIONECTOMY: SHX129

## 2021-11-08 HISTORY — PX: ARTHRODESIS METATARSAL: SHX6565

## 2021-11-08 HISTORY — DX: Unspecified osteoarthritis, unspecified site: M19.90

## 2021-11-08 SURGERY — BUNIONECTOMY
Anesthesia: General | Site: Toe | Laterality: Right

## 2021-11-08 MED ORDER — OXYCODONE-ACETAMINOPHEN 5-325 MG PO TABS: 1.0000 | ORAL_TABLET | Freq: Four times a day (QID) | ORAL | 0 refills | Status: DC | PRN

## 2021-11-08 MED ORDER — FENTANYL CITRATE PF 50 MCG/ML IJ SOSY: 25.0000 ug | PREFILLED_SYRINGE | INTRAMUSCULAR | Status: DC | PRN

## 2021-11-08 MED ORDER — LIDOCAINE HCL (CARDIAC) PF 100 MG/5ML IV SOSY
PREFILLED_SYRINGE | INTRAVENOUS | Status: DC | PRN
  Administered 2021-11-08: 60 mg via INTRAVENOUS

## 2021-11-08 MED ORDER — METOCLOPRAMIDE HCL 5 MG/ML IJ SOLN: 5.0000 mg | Freq: Three times a day (TID) | INTRAMUSCULAR | Status: DC | PRN

## 2021-11-08 MED ORDER — FENTANYL CITRATE (PF) 100 MCG/2ML IJ SOLN
INTRAMUSCULAR | Status: DC | PRN
Start: 2021-11-08 — End: 2021-11-08
  Administered 2021-11-08: 50 ug via INTRAVENOUS
  Administered 2021-11-08 (×2): 25 ug via INTRAVENOUS

## 2021-11-08 MED ORDER — ONDANSETRON HCL 4 MG PO TABS: 4.0000 mg | ORAL_TABLET | Freq: Four times a day (QID) | ORAL | Status: DC | PRN

## 2021-11-08 MED ORDER — ONDANSETRON HCL 4 MG/2ML IJ SOLN: 4.0000 mg | Freq: Once | INTRAMUSCULAR | Status: DC | PRN

## 2021-11-08 MED ORDER — PROPOFOL 10 MG/ML IV BOLUS
INTRAVENOUS | Status: DC | PRN
Start: 2021-11-08 — End: 2021-11-08
  Administered 2021-11-08 (×3): 50 mg via INTRAVENOUS
  Administered 2021-11-08: 150 mg via INTRAVENOUS

## 2021-11-08 MED ORDER — BUPIVACAINE LIPOSOME 1.3 % IJ SUSP
INTRAMUSCULAR | Status: DC | PRN
  Administered 2021-11-08 (×2): 10 mL

## 2021-11-08 MED ORDER — ONDANSETRON HCL 4 MG/2ML IJ SOLN: 4.0000 mg | Freq: Four times a day (QID) | INTRAMUSCULAR | Status: DC | PRN

## 2021-11-08 MED ORDER — BUPIVACAINE HCL (PF) 0.25 % IJ SOLN
INTRAMUSCULAR | Status: DC | PRN
  Administered 2021-11-08: 5 mL
  Administered 2021-11-08: 6 mL
  Administered 2021-11-08: 10 mL
  Administered 2021-11-08: 4 mL
  Administered 2021-11-08: 6 mL

## 2021-11-08 MED ORDER — DEXAMETHASONE SODIUM PHOSPHATE 10 MG/ML IJ SOLN
INTRAMUSCULAR | Status: DC | PRN
  Administered 2021-11-08: 4 mg via INTRAVENOUS

## 2021-11-08 MED ORDER — EPHEDRINE SULFATE (PRESSORS) 50 MG/ML IJ SOLN
INTRAMUSCULAR | Status: DC | PRN
  Administered 2021-11-08 (×5): 10 mg via INTRAVENOUS

## 2021-11-08 MED ORDER — CEFAZOLIN SODIUM-DEXTROSE 2-4 GM/100ML-% IV SOLN
2.0000 g | INTRAVENOUS | Status: AC
  Administered 2021-11-08: 2 g via INTRAVENOUS

## 2021-11-08 MED ORDER — LACTATED RINGERS IV SOLN: INTRAVENOUS | Status: DC

## 2021-11-08 MED ORDER — SODIUM CHLORIDE 0.9 % IR SOLN
Status: DC | PRN
  Administered 2021-11-08: 1

## 2021-11-08 MED ORDER — METOCLOPRAMIDE HCL 5 MG PO TABS: 5.0000 mg | ORAL_TABLET | Freq: Three times a day (TID) | ORAL | Status: DC | PRN

## 2021-11-08 MED ORDER — ONDANSETRON HCL 4 MG/2ML IJ SOLN
INTRAMUSCULAR | Status: DC | PRN
  Administered 2021-11-08: 4 mg via INTRAVENOUS

## 2021-11-08 MED ORDER — DEXMEDETOMIDINE HCL IN NACL 200 MCG/50ML IV SOLN
INTRAVENOUS | Status: DC | PRN
  Administered 2021-11-08 (×2): 20 ug via INTRAVENOUS

## 2021-11-08 SURGICAL SUPPLY — 61 items
APL SKNCLS STERI-STRIP NONHPOA (GAUZE/BANDAGES/DRESSINGS) ×1
BENZOIN TINCTURE PRP APPL 2/3 (GAUZE/BANDAGES/DRESSINGS) ×2 IMPLANT
BIT DRILL 2 FENESTRATED (MISCELLANEOUS) IMPLANT
BIT DRILL SOLID 2.0 X 110MM (DRILL) IMPLANT
BIT DRILLL 2 FENESTRATED (MISCELLANEOUS) ×2
BLADE MED AGGRESSIVE (BLADE) ×1 IMPLANT
BLADE OSC/SAGITTAL MD 5.5X18 (BLADE) ×1 IMPLANT
BLADE SURG 15 STRL LF DISP TIS (BLADE) IMPLANT
BLADE SURG 15 STRL SS (BLADE) ×2
BNDG CMPR 75X41 PLY HI ABS (GAUZE/BANDAGES/DRESSINGS) ×1
BNDG COHESIVE 4X5 TAN ST LF (GAUZE/BANDAGES/DRESSINGS) ×2 IMPLANT
BNDG ELASTIC 4X5.8 VLCR STR LF (GAUZE/BANDAGES/DRESSINGS) ×2 IMPLANT
BNDG ESMARK 4X12 TAN STRL LF (GAUZE/BANDAGES/DRESSINGS) ×2 IMPLANT
BNDG GAUZE DERMACEA FLUFF (GAUZE/BANDAGES/DRESSINGS) ×1
BNDG GAUZE DERMACEA FLUFF 4 (GAUZE/BANDAGES/DRESSINGS) IMPLANT
BNDG GZE DERMACEA 4 6PLY (GAUZE/BANDAGES/DRESSINGS) ×1
BNDG STRETCH 4X75 STRL LF (GAUZE/BANDAGES/DRESSINGS) ×2 IMPLANT
BOOT STEPPER DURA MED (SOFTGOODS) ×1 IMPLANT
CANISTER SUCT 1200ML W/VALVE (MISCELLANEOUS) ×2 IMPLANT
COVER LIGHT HANDLE UNIVERSAL (MISCELLANEOUS) ×4 IMPLANT
DRAPE FLUOR MINI C-ARM 54X84 (DRAPES) ×2 IMPLANT
DRILL SOLID 2.0 X 110MM (DRILL) ×2
DURAPREP 26ML APPLICATOR (WOUND CARE) ×2 IMPLANT
ELECT REM PT RETURN 9FT ADLT (ELECTROSURGICAL) ×2
ELECTRODE REM PT RTRN 9FT ADLT (ELECTROSURGICAL) ×1 IMPLANT
GAUZE SPONGE 4X4 12PLY STRL (GAUZE/BANDAGES/DRESSINGS) ×2 IMPLANT
GAUZE XEROFORM 1X8 LF (GAUZE/BANDAGES/DRESSINGS) ×2 IMPLANT
GLOVE SRG 8 PF TXTR STRL LF DI (GLOVE) ×2 IMPLANT
GLOVE SURG ENC MOIS LTX SZ7.5 (GLOVE) ×4 IMPLANT
GLOVE SURG UNDER POLY LF SZ8 (GLOVE) ×4
GOWN SPEC L4 XLG W/TWL (GOWN DISPOSABLE) ×1 IMPLANT
GOWN STRL REUS W/ TWL LRG LVL3 (GOWN DISPOSABLE) ×2 IMPLANT
GOWN STRL REUS W/TWL LRG LVL3 (GOWN DISPOSABLE) ×2
K-WIRE 1.6 mm x 152 mm (Wire) ×1 IMPLANT
K-WIRE DBL END TROCAR 6X.045 (WIRE) ×2
K-WIRE SMOOTH TROCAR 2.0X150 (WIRE) ×4
K-WIRE SNGL END 1.2X150 (MISCELLANEOUS) ×2
KIT TURNOVER KIT A (KITS) ×2 IMPLANT
KWIRE DBL END TROCAR 6X.045 (WIRE) IMPLANT
KWIRE SMOOTH TROCAR 2.0X150 (WIRE) IMPLANT
KWIRE SNGL END 1.2X150 (MISCELLANEOUS) IMPLANT
NS IRRIG 500ML POUR BTL (IV SOLUTION) ×2 IMPLANT
PACK EXTREMITY ARMC (MISCELLANEOUS) ×2 IMPLANT
PLATE 4H SLANT RIGHT (Plate) ×1 IMPLANT
PLATE SLANTED STR 3H RIGHT (Plate) ×1 IMPLANT
RASP SM TEAR CROSS CUT (RASP) ×1 IMPLANT
SCREW LOCK PLATE R3 2.7X12 (Screw) ×1 IMPLANT
SCREW LOCK PLATE R3 2.7X18 (Screw) ×2 IMPLANT
SCREW LOCK PLATE R3 2.7X20 (Screw) ×3 IMPLANT
SCREW LOCK PLATE R3 2.7X22 (Screw) ×1 IMPLANT
SCREW NON LOCKING 2.7X18 (Screw) ×1 IMPLANT
SCREW NON LOCKING 2.7X20 (Screw) ×1 IMPLANT
STOCKINETTE IMPERVIOUS LG (DRAPES) ×2 IMPLANT
STRIP CLOSURE SKIN 1/4X4 (GAUZE/BANDAGES/DRESSINGS) ×2 IMPLANT
SUT MNCRL 4-0 (SUTURE) ×2
SUT MNCRL 4-0 27XMFL (SUTURE) ×1
SUT VIC AB 3-0 SH 27 (SUTURE) ×2
SUT VIC AB 3-0 SH 27X BRD (SUTURE) IMPLANT
SUT VIC AB 4-0 FS2 27 (SUTURE) ×1 IMPLANT
SUTURE MNCRL 4-0 27XMF (SUTURE) IMPLANT
WIRE OLIVE SMOOTH 1.4MMX60MM (WIRE) ×4 IMPLANT

## 2021-11-08 NOTE — H&P (Signed)
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  The various methods of treatment have been discussed with the patient.  No guarantees were given.  After consideration of risks, benefits and other options for treatment, the patient has consented to surgery.  I have reviewed the patients' chart and labs.     A history and physical examination was performed in my office.  The patient was reexamined.  There have been no changes to this history and physical examination.  [name] A

## 2021-11-08 NOTE — Anesthesia Preprocedure Evaluation (Signed)
Anesthesia Evaluation  Patient identified by MRN, date of birth, ID band Patient awake    Reviewed: Allergy & Precautions, H&P , NPO status , Patient's Chart, lab work & pertinent test results, reviewed documented beta blocker date and time   Airway Mallampati: III  TM Distance: >3 FB Neck ROM: full    Dental  (+) Teeth Intact   Pulmonary sleep apnea and Continuous Positive Airway Pressure Ventilation ,    Pulmonary exam normal        Cardiovascular Exercise Tolerance: Good hypertension, On Medications negative cardio ROS Normal cardiovascular exam Rate:Normal     Neuro/Psych Anxiety negative neurological ROS  negative psych ROS   GI/Hepatic Neg liver ROS, GERD  Medicated,  Endo/Other  negative endocrine ROS  Renal/GU negative Renal ROS  negative genitourinary   Musculoskeletal   Abdominal   Peds  Hematology  (+) Blood dyscrasia, anemia ,   Anesthesia Other Findings   Reproductive/Obstetrics negative OB ROS                             Anesthesia Physical Anesthesia Plan  ASA: 3  Anesthesia Plan: General LMA   Post-op Pain Management:    Induction:   PONV Risk Score and Plan: 4 or greater  Airway Management Planned:   Additional Equipment:   Intra-op Plan:   Post-operative Plan:   Informed Consent: I have reviewed the patients History and Physical, chart, labs and discussed the procedure including the risks, benefits and alternatives for the proposed anesthesia with the patient or authorized representative who has indicated his/her understanding and acceptance.       Plan Discussed with: CRNA  Anesthesia Plan Comments:         Anesthesia Quick Evaluation

## 2021-11-08 NOTE — Transfer of Care (Signed)
Immediate Anesthesia Transfer of Care Note  Patient: Summer Carpenter  Procedure(s) Performed: Liane Comber (Right: Toe) ARTHRODESIS; LISFRANC; MULTIPLE (Right: Toe)  Patient Location: PACU  Anesthesia Type:General  Level of Consciousness: drowsy and patient cooperative  Airway & Oxygen Therapy: Patient Spontanous Breathing  Post-op Assessment: Report given to RN and Post -op Vital signs reviewed and stable  Post vital signs: Reviewed and stable  Last Vitals:  Vitals Value Taken Time  BP 121/69 11/08/21 1447  Temp    Pulse 81 11/08/21 1449  Resp 18 11/08/21 1449  SpO2 96 % 11/08/21 1449  Vitals shown include unvalidated device data.  Last Pain:  Vitals:   11/08/21 1119  PainSc: 0-No pain         Complications: No notable events documented.

## 2021-11-08 NOTE — Anesthesia Procedure Notes (Signed)
Procedure Name: LMA Insertion Date/Time: 11/08/2021 12:19 PM  Performed by: Jonna Clark, CRNAPre-anesthesia Checklist: Patient identified, Patient being monitored, Timeout performed, Emergency Drugs available and Suction available Patient Re-evaluated:Patient Re-evaluated prior to induction Oxygen Delivery Method: Circle system utilized Preoxygenation: Pre-oxygenation with 100% oxygen Induction Type: IV induction Ventilation: Mask ventilation without difficulty LMA: LMA inserted LMA Size: 4.0 Tube type: Oral Number of attempts: 1 Placement Confirmation: positive ETCO2 and breath sounds checked- equal and bilateral Tube secured with: Tape Dental Injury: Teeth and Oropharynx as per pre-operative assessment

## 2021-11-08 NOTE — Op Note (Signed)
Operative note   Surgeon:Isaac Dubie Lawyer: None    Preop diagnosis: 1.  Right foot hallux valgus deformity 2.  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  [place] hallux valgus correction right foot 2.  Second metatarsal cuneiform arthrodesis 3.  Third metatarsal cuneiform arthrodesis 4.  Intraoperative fluoroscopy use    EBL: Minimal    Anesthesia:local and general.  Local consisted of a total of 30 cc of 0.25% bupivacaine and Exparel long-acting anesthetic    Hemostasis: Mid calf tourniquet inflated to 200 mmHg for 120 minutes    Specimen: None    Complications: None    Operative indications:Summer Carpenter is an 56 y.o. that presents today for surgical Carpenter.  The risks/benefits/alternatives/complications have been discussed and consent has been given.    Procedure:  Patient was brought into the OR and placed on the operating table in thesupine position. After anesthesia was obtained theright lower extremity was prepped and draped in usual sterile fashion.  Attention was initially directed to the dorsal aspect of the right midfoot.  A longitudinal incision was performed overlying the second and third metatarsal cuneiform joints.  Sharp and blunt dissection carried down to the deeper tissues.  The extensor tendons and extensor digitorum brevis muscle bellies were noted.  The tendons were reflected laterally the muscle belly reflected medially.  Subperiosteal dissection was then undertaken.  This exposed the second and third metatarsal cuneiform joints.  Para-articular spurring was noted throughout and removed.  At this time exposure of the joints was performed.  There was virtually no cartilage to the second metatarsal cuneiform joint and very minimal cartilage at the third metatarsal cuneiform joint.  All of the articular cartilage was then removed.  After removal of the cartilage the area was smoothed with a  power rasp.  The joint was then prepared with a 2.0 mm drill bit.  It was stabilized with a 0.045 K wire.  A dorsal 5 hole locking plate from the [employer] screw set was placed with compression within the plate.  2.7 millimeter screws were used.  A 4 hole compression locking plate with 2.7 millimeter screws were used and the third met cuneiform fusion site.  Excellent stability and alignment was noted with good compression noted.  The wound was flushed with copious amounts of irrigation.  Closure was performed with a 3-0 Vicryl for the deeper layer 4-0 Vicryl the subcutaneous tissue and a 4-0 Monocryl for the skin.  Attention was directed to the dorsal medial first MTPJ where the dorsomedial incision was performed.  Sharp and blunt dissection carried down to the capsule.  A T capsulotomy was performed.  The dorsomedial eminence was noted and transected with a power saw.  Next a V osteotomy was created in the diaphyseal metaphyseal junction.  The capital fragment was translocated laterally.  This was then stabilized with a 0.062 K wire.  Good stability and alignment was noted.  The ensuing overhanging ledge was transected with a power saw and smoothed with a power rasp.  Good realignment of the first MTPJ was noted.  The capsule was then closed with a 3-0 Vicryl.  The subcutaneous tissue with a 4-0 Vicryl and the skin with a 4 Monocryl.  A bulky sterile dressing was applied.  Patient was placed in an equalizer walker boot for stabilization.  Discharge from the OR with all vital signs stable and vascular status intact.    Patient  tolerated the procedure and anesthesia well.  Was transported from the OR to the PACU with all vital signs stable and vascular status intact. To be discharged per routine protocol.  Will follow up in approximately 1 week in the outpatient clinic.

## 2021-11-09 ENCOUNTER — Encounter: Payer: Self-pay | Admitting: Podiatry

## 2021-11-10 NOTE — Anesthesia Postprocedure Evaluation (Signed)
Anesthesia Post Note  Patient: Summer Carpenter  Procedure(s) Performed: Liane Comber (Right: Toe) ARTHRODESIS; LISFRANC; MULTIPLE (Right: Toe)  Patient location during evaluation: PACU Anesthesia Type: General Level of consciousness: awake and alert Pain management: pain level controlled Vital Signs Assessment: post-procedure vital signs reviewed and stable Respiratory status: spontaneous breathing, nonlabored ventilation, respiratory function stable and patient connected to nasal cannula oxygen Cardiovascular status: blood pressure returned to baseline and stable Postop Assessment: no apparent nausea or vomiting Anesthetic complications: no   No notable events documented.   Last Vitals:  Vitals:   11/08/21 1500 11/08/21 1515  BP: 112/68 117/71  Pulse: 73 78  Resp: 18 19  Temp:  (!) 36.3 C  SpO2: 96% 95%    Last Pain:  Vitals:   11/09/21 1152  PainSc: 3                  Molli Barrows

## 2021-11-28 ENCOUNTER — Other Ambulatory Visit (HOSPITAL_COMMUNITY): Payer: Self-pay | Admitting: Adult Health

## 2021-11-28 DIAGNOSIS — Z1231 Encounter for screening mammogram for malignant neoplasm of breast: Secondary | ICD-10-CM

## 2021-12-14 ENCOUNTER — Ambulatory Visit (HOSPITAL_COMMUNITY)
Admission: RE | Admit: 2021-12-14 | Discharge: 2021-12-14 | Disposition: A | Discharge status: Home / Self Care | Payer: 59 | Source: Ambulatory Visit | Attending: Adult Health | Admitting: Adult Health

## 2021-12-14 DIAGNOSIS — Z1231 Encounter for screening mammogram for malignant neoplasm of breast: Secondary | ICD-10-CM | POA: Diagnosis present

## 2021-12-18 ENCOUNTER — Other Ambulatory Visit (HOSPITAL_COMMUNITY): Payer: Self-pay | Admitting: Adult Health

## 2021-12-18 ENCOUNTER — Telehealth: Payer: Self-pay | Admitting: Adult Health

## 2021-12-18 DIAGNOSIS — R928 Other abnormal and inconclusive findings on diagnostic imaging of breast: Secondary | ICD-10-CM

## 2021-12-18 NOTE — Telephone Encounter (Signed)
Patient called and said her mammogram was abnormal. She would like to know what her next steps is? Please advise.

## 2021-12-18 NOTE — Telephone Encounter (Signed)
Pt aware of mammogram and that radiology will call her for follow up appt, or she can call them

## 2021-12-26 ENCOUNTER — Ambulatory Visit (HOSPITAL_COMMUNITY)
Admission: RE | Admit: 2021-12-26 | Discharge: 2021-12-26 | Disposition: A | Discharge status: Home / Self Care | Payer: 59 | Source: Ambulatory Visit | Attending: Adult Health | Admitting: Adult Health

## 2021-12-26 DIAGNOSIS — R928 Other abnormal and inconclusive findings on diagnostic imaging of breast: Secondary | ICD-10-CM | POA: Insufficient documentation

## 2021-12-31 ENCOUNTER — Other Ambulatory Visit: Payer: Self-pay | Admitting: Obstetrics & Gynecology

## 2022-04-30 ENCOUNTER — Ambulatory Visit: Payer: 59 | Admitting: General Practice

## 2022-05-25 ENCOUNTER — Other Ambulatory Visit: Payer: Self-pay | Admitting: Cardiovascular Disease

## 2022-06-28 ENCOUNTER — Other Ambulatory Visit: Payer: Self-pay | Admitting: Obstetrics & Gynecology

## 2022-11-12 ENCOUNTER — Other Ambulatory Visit (HOSPITAL_COMMUNITY): Payer: Self-pay | Admitting: Adult Health

## 2022-11-12 DIAGNOSIS — Z1231 Encounter for screening mammogram for malignant neoplasm of breast: Secondary | ICD-10-CM

## 2022-12-17 ENCOUNTER — Ambulatory Visit (HOSPITAL_COMMUNITY)
Admission: RE | Admit: 2022-12-17 | Discharge: 2022-12-17 | Disposition: A | Discharge status: Home / Self Care | Payer: 59 | Source: Ambulatory Visit | Attending: Adult Health | Admitting: Adult Health

## 2022-12-17 DIAGNOSIS — Z1231 Encounter for screening mammogram for malignant neoplasm of breast: Secondary | ICD-10-CM | POA: Diagnosis present

## 2023-07-31 ENCOUNTER — Emergency Department (HOSPITAL_COMMUNITY)

## 2023-07-31 ENCOUNTER — Encounter (HOSPITAL_COMMUNITY): Payer: Self-pay | Admitting: Emergency Medicine

## 2023-07-31 ENCOUNTER — Emergency Department (HOSPITAL_COMMUNITY)
Admission: EM | Admit: 2023-07-31 | Discharge: 2023-07-31 | Disposition: A | Discharge status: Home / Self Care | Attending: Emergency Medicine | Admitting: Emergency Medicine

## 2023-07-31 ENCOUNTER — Other Ambulatory Visit: Payer: Self-pay

## 2023-07-31 DIAGNOSIS — I10 Essential (primary) hypertension: Secondary | ICD-10-CM | POA: Diagnosis not present

## 2023-07-31 DIAGNOSIS — M79661 Pain in right lower leg: Secondary | ICD-10-CM | POA: Diagnosis present

## 2023-07-31 DIAGNOSIS — Z79899 Other long term (current) drug therapy: Secondary | ICD-10-CM | POA: Insufficient documentation

## 2023-07-31 DIAGNOSIS — Z7982 Long term (current) use of aspirin: Secondary | ICD-10-CM | POA: Diagnosis not present

## 2023-07-31 DIAGNOSIS — M79604 Pain in right leg: Secondary | ICD-10-CM

## 2023-07-31 LAB — BASIC METABOLIC PANEL WITH GFR
Anion gap: 9 (ref 5–15)
BUN: 18 mg/dL (ref 6–20)
CO2: 26 mmol/L (ref 22–32)
Calcium: 9.4 mg/dL (ref 8.9–10.3)
Chloride: 103 mmol/L (ref 98–111)
Creatinine, Ser: 1.08 mg/dL — ABNORMAL HIGH (ref 0.44–1.00)
GFR, Estimated: 60 mL/min — ABNORMAL LOW (ref >60)
Glucose, Bld: 89 mg/dL (ref 70–99)
Potassium: 3.8 mmol/L (ref 3.5–5.1)
Sodium: 138 mmol/L (ref 135–145)

## 2023-07-31 LAB — MAGNESIUM: Magnesium: 2 mg/dL (ref 1.7–2.4)

## 2023-07-31 MED ORDER — IBUPROFEN 400 MG PO TABS
600.0000 mg | ORAL_TABLET | Freq: Once | ORAL | Status: AC
  Administered 2023-07-31: 600 mg via ORAL
  Filled 2023-07-31: qty 2

## 2023-07-31 MED ORDER — DICLOFENAC SODIUM 1 % EX GEL: 2.0000 g | Freq: Four times a day (QID) | CUTANEOUS | 0 refills | Status: DC

## 2023-07-31 NOTE — ED Notes (Signed)
 Patient discharged. Provider spoke to patient. Paperwork given to patient and reviewed. Pt verbalized understanding. VSS. A+Ox4. Patient ambulated out of the ER with steady, independent gait. No iv in place.

## 2023-07-31 NOTE — Discharge Instructions (Signed)
 In the ER today for right leg pain.  Fortunately your ultrasound did not show any blood clot.  Regarding the muscle cramps, your potassium and magnesium levels are normal and you have adequate blood flow to your foot at this time.  Follow-up with your PCP if you have continued cramping in your legs at nighttime.  Make sure you are drinking plenty of water.  Come back to the ER for new or worsening symptoms.

## 2023-07-31 NOTE — ED Provider Notes (Signed)
 [place] EMERGENCY DEPARTMENT AT [facility] Provider Note   CSN: [id number] Arrival date & time: 07/31/23  1005     History  Chief Complaint  Patient presents with   Leg Pain    Summer Carpenter is a 58 y.o. female. History of hypertension, high cholesterol.  Presents to ER for right calf pain x 1 week, denies swelling or redness or warmth.  Went to her PCP who prescribed muscle relaxer Flexeril  without relief.  She states she is worried about blood clot as it runs in her family, no recent surgeries or immobilization, no OCPs or HRT.  No chest pain, shortness of breath.  She states she gets cramps in her legs at night only, denies cramping with activity.  She thinks it could be coming from her prior right foot surgery but is unsure.  Leg Pain      Home Medications Prior to Admission medications   Medication Sig Start Date End Date Taking? Authorizing Provider  diclofenac  Sodium (VOLTAREN ) 1 % GEL Apply 2 g topically 4 (four) times daily. 07/31/23  Yes Charika Mikelson A, PA-C  acetaminophen  (TYLENOL ) 500 MG tablet Take 1,000 mg by mouth every 6 (six) hours as needed for mild pain or moderate pain.    [provider]  aspirin  EC 81 MG tablet Take 81 mg by mouth daily.      [provider]  LINZESS 145 MCG CAPS capsule Take 145 mcg by mouth daily as needed (for constipation). 02/21/18   [provider]  losartan -hydrochlorothiazide (HYZAAR) 50-12.5 MG tablet TAKE 1 TABLET BY MOUTH EVERY DAY 05/25/22   Croitoru, Mihai, MD  omeprazole (PRILOSEC) 40 MG capsule Take 40 mg by mouth daily. 09/30/19   [provider]  oxyCODONE -acetaminophen  (PERCOCET) 5-325 MG tablet Take 1-2 tablets by mouth every 6 (six) hours as needed for severe pain. Max 6 tabs per day 11/08/21   Anell Baptist, DPM  potassium chloride  (K-DUR) 10 MEQ tablet TAKE 1 TABLET BY MOUTH EVERY DAY 08/04/18   Croitoru, Mihai, MD  propranolol  (INDERAL ) 20 MG tablet TAKE 1 TABLET BY  MOUTH EVERY DAY AS NEEDED PALPITATIONS 06/08/20   Croitoru, Karyl Paget, MD  propranolol  ER (INDERAL  LA) 120 MG 24 hr capsule Take 1 capsule (120 mg total) by mouth daily. Patient taking differently: Take 120 mg by mouth daily as needed. 05/04/20   Croitoru, Mihai, MD  Semaglutide-Weight Management (WEGOVY) 0.25 MG/0.5ML SOAJ Inject 0.25 mg into the skin once a week. Sunday    [provider]  sertraline (ZOLOFT) 100 MG tablet Take 100 mg by mouth every evening. 03/27/18   [provider]  simvastatin (ZOCOR) 10 MG tablet Take 10 mg by mouth daily.    [provider]  valACYclovir  (VALTREX ) 1000 MG tablet TAKE 1 TABLET BY MOUTH EVERY DAY AS NEEDED 06/28/22   Wendelyn Halter, MD      Allergies    Septra [bactrim] and Sulfamethoxazole-trimethoprim    Review of Systems   Review of Systems  Physical Exam Updated Vital Signs Pulse 75   Temp (!) 97.5 F (36.4 C) (Oral)   Resp 16   Ht 5\' 7"  (1.702 m)   Wt 101.6 kg   LMP 09/28/2019   SpO2 100%   BMI 35.08 kg/m  Physical Exam Vitals and nursing note reviewed.  Constitutional:      General: She is not in acute distress.    Appearance: She is well-developed.  HENT:     Head: Normocephalic and  atraumatic.  Eyes:     Conjunctiva/sclera: Conjunctivae normal.  Cardiovascular:     Rate and Rhythm: Normal rate and regular rhythm.     Heart sounds: No murmur heard. Pulmonary:     Effort: Pulmonary effort is normal. No respiratory distress.     Breath sounds: Normal breath sounds.  Abdominal:     Palpations: Abdomen is soft.     Tenderness: There is no abdominal tenderness.  Musculoskeletal:        General: No swelling or signs of injury.     Cervical back: Neck supple.     Right lower leg: No edema.     Left lower leg: No edema.     Comments: Right leg is not swollen, there is a well-healed scar on the dorsum of the right foot, no tenderness to the foot, minimal tenderness to right posterior lateral calf.  There is no  redness or warmth.  Negative Homan sig.  Foot is warm well-perfused, cap refill is brisk.  Skin:    General: Skin is warm and dry.     Capillary Refill: Capillary refill takes less than 2 seconds.  Neurological:     General: No focal deficit present.     Mental Status: She is alert and oriented to person, place, and time.  Psychiatric:        Mood and Affect: Mood normal.     ED Results / Procedures / Treatments   Labs (all labs ordered are listed, but only abnormal results are displayed) Labs Reviewed  BASIC METABOLIC PANEL WITH GFR - Abnormal; Notable for the following components:      Result Value   Creatinine, Ser 1.08 (*)    GFR, Estimated 60 (*)    All other components within normal limits  MAGNESIUM    EKG None  Radiology US  Venous Img Lower Right (DVT Study) Result Date: 07/31/2023 CLINICAL DATA:  Calf pain for a week EXAM: Right LOWER EXTREMITY VENOUS DOPPLER ULTRASOUND TECHNIQUE: Gray-scale sonography with graded compression, as well as color Doppler and duplex ultrasound were performed to evaluate the lower extremity deep venous systems from the level of the common femoral vein and including the common femoral, femoral, profunda femoral, popliteal and calf veins including the posterior tibial, peroneal and gastrocnemius veins when visible. The superficial great saphenous vein was also interrogated. Spectral Doppler was utilized to evaluate flow at rest and with distal augmentation maneuvers in the common femoral, femoral and popliteal veins. COMPARISON:  None Available. FINDINGS: Contralateral Common Femoral Vein: Respiratory phasicity is normal and symmetric with the symptomatic side. No evidence of thrombus. Normal compressibility. Common Femoral Vein: No evidence of thrombus. Normal compressibility, respiratory phasicity and response to augmentation. Saphenofemoral Junction: No evidence of thrombus. Normal compressibility and flow on color Doppler imaging. Profunda Femoral  Vein: No evidence of thrombus. Normal compressibility and flow on color Doppler imaging. Femoral Vein: No evidence of thrombus. Normal compressibility, respiratory phasicity and response to augmentation. Popliteal Vein: No evidence of thrombus. Normal compressibility, respiratory phasicity and response to augmentation. Calf Veins: No evidence of thrombus. Normal compressibility and flow on color Doppler imaging. Superficial Great Saphenous Vein: No evidence of thrombus. Normal compressibility. Venous Reflux:  None. Other Findings:  None. IMPRESSION: No evidence of right lower extremity DVT. Electronically Signed   By: Adrianna Horde M.D.   On: 07/31/2023 13:31    Procedures Procedures    Medications Ordered in ED Medications  ibuprofen  (ADVIL ) tablet 600 mg (600 mg Oral Given 07/31/23 1132)  ED Course/ Medical Decision Making/ A&P                                 Medical Decision Making Diagnosis includes but not limited to muscle strain, DVT, PAD, sprain, contusion, other  ED course: Patient has a right calf pain with no injury, no swelling, she was worried about blood clot, ultrasound ruled this out and she is overall low risk for this.  She has good pulses.  Good sensation outside of baseline numbness in her toes after previous surgery.  Likely musculoskeletal pain, will give topical diclofenac  as she does have some baseline renal dysfunction on her labs, stable but will avoid large amounts of NSAIDs.  She also is complaining of some cramping in her calves at nighttime.  Normal potassium, normal magnesium, does not happen as she walks.  Advised on PCP follow-up and strict return precautions.  Amount and/or Complexity of Data Reviewed External Data Reviewed: labs. Labs: ordered. Decision-making details documented in ED Course. Radiology: ordered.    Details: No DVT on ultrasound per radiology report  Risk Prescription drug management.           Final Clinical Impression(s) / ED  Diagnoses Final diagnoses:  Right leg pain    Rx / DC Orders ED Discharge Orders          Ordered    diclofenac  Sodium (VOLTAREN ) 1 % GEL  4 times daily        07/31/23 [address] PA-C 07/31/23 1345    Mordecai Applebaum, MD 08/03/23 1507

## 2023-07-31 NOTE — ED Triage Notes (Signed)
 Pt c/o of right leg calf pain x1 week. Pt pcp prescribed her flexeril  but pt states that does not help. She is concerned for a blood clot

## 2023-08-29 ENCOUNTER — Other Ambulatory Visit: Payer: Self-pay | Admitting: Obstetrics & Gynecology

## 2023-10-09 ENCOUNTER — Encounter (HOSPITAL_COMMUNITY): Payer: Self-pay | Admitting: *Deleted

## 2023-10-09 ENCOUNTER — Emergency Department (HOSPITAL_COMMUNITY)
Admission: EM | Admit: 2023-10-09 | Discharge: 2023-10-09 | Disposition: A | Discharge status: Home / Self Care | Attending: Emergency Medicine | Admitting: Emergency Medicine

## 2023-10-09 ENCOUNTER — Other Ambulatory Visit: Payer: Self-pay

## 2023-10-09 DIAGNOSIS — Z79899 Other long term (current) drug therapy: Secondary | ICD-10-CM | POA: Insufficient documentation

## 2023-10-09 DIAGNOSIS — R55 Syncope and collapse: Secondary | ICD-10-CM | POA: Insufficient documentation

## 2023-10-09 DIAGNOSIS — R197 Diarrhea, unspecified: Secondary | ICD-10-CM | POA: Diagnosis not present

## 2023-10-09 DIAGNOSIS — I10 Essential (primary) hypertension: Secondary | ICD-10-CM | POA: Diagnosis not present

## 2023-10-09 DIAGNOSIS — R112 Nausea with vomiting, unspecified: Secondary | ICD-10-CM | POA: Insufficient documentation

## 2023-10-09 DIAGNOSIS — Z7982 Long term (current) use of aspirin: Secondary | ICD-10-CM | POA: Insufficient documentation

## 2023-10-09 LAB — CBC WITH DIFFERENTIAL/PLATELET
Abs Immature Granulocytes: 0.03 10*3/uL (ref 0.00–0.07)
Basophils Absolute: 0 10*3/uL (ref 0.0–0.1)
Basophils Relative: 0 %
Eosinophils Absolute: 0.2 10*3/uL (ref 0.0–0.5)
Eosinophils Relative: 2 %
HCT: 39.7 % (ref 36.0–46.0)
Hemoglobin: 13.5 g/dL (ref 12.0–15.0)
Immature Granulocytes: 0 %
Lymphocytes Relative: 27 %
Lymphs Abs: 2.1 10*3/uL (ref 0.7–4.0)
MCH: 31.4 pg (ref 26.0–34.0)
MCHC: 34 g/dL (ref 30.0–36.0)
MCV: 92.3 fL (ref 80.0–100.0)
Monocytes Absolute: 0.6 10*3/uL (ref 0.1–1.0)
Monocytes Relative: 8 %
Neutro Abs: 4.9 10*3/uL (ref 1.7–7.7)
Neutrophils Relative %: 63 %
Platelets: 297 10*3/uL (ref 150–400)
RBC: 4.3 MIL/uL (ref 3.87–5.11)
RDW: 13.9 % (ref 11.5–15.5)
WBC: 7.8 10*3/uL (ref 4.0–10.5)
nRBC: 0 % (ref 0.0–0.2)

## 2023-10-09 LAB — URINALYSIS, ROUTINE W REFLEX MICROSCOPIC
Bilirubin Urine: NEGATIVE
Glucose, UA: NEGATIVE mg/dL
Hgb urine dipstick: NEGATIVE
Ketones, ur: 5 mg/dL — AB
Nitrite: NEGATIVE
Protein, ur: 30 mg/dL — AB
Specific Gravity, Urine: 1.027 (ref 1.005–1.030)
pH: 5 (ref 5.0–8.0)

## 2023-10-09 LAB — COMPREHENSIVE METABOLIC PANEL WITH GFR
ALT: 15 U/L (ref 0–44)
AST: 22 U/L (ref 15–41)
Albumin: 3.8 g/dL (ref 3.5–5.0)
Alkaline Phosphatase: 90 U/L (ref 38–126)
Anion gap: 10 (ref 5–15)
BUN: 14 mg/dL (ref 6–20)
CO2: 29 mmol/L (ref 22–32)
Calcium: 9.3 mg/dL (ref 8.9–10.3)
Chloride: 101 mmol/L (ref 98–111)
Creatinine, Ser: 1.03 mg/dL — ABNORMAL HIGH (ref 0.44–1.00)
GFR, Estimated: >60 mL/min (ref >60)
Glucose, Bld: 98 mg/dL (ref 70–99)
Potassium: 3.4 mmol/L — ABNORMAL LOW (ref 3.5–5.1)
Sodium: 140 mmol/L (ref 135–145)
Total Bilirubin: 1.3 mg/dL — ABNORMAL HIGH (ref 0.0–1.2)
Total Protein: 7.7 g/dL (ref 6.5–8.1)

## 2023-10-09 LAB — CBG MONITORING, ED: Glucose-Capillary: 90 mg/dL (ref 70–99)

## 2023-10-09 MED ORDER — POTASSIUM CHLORIDE CRYS ER 20 MEQ PO TBCR: 40.0000 meq | EXTENDED_RELEASE_TABLET | Freq: Once | ORAL | Status: DC

## 2023-10-09 MED ORDER — SODIUM CHLORIDE 0.9 % IV BOLUS
1000.0000 mL | Freq: Once | INTRAVENOUS | Status: AC
  Administered 2023-10-09: 1000 mL via INTRAVENOUS

## 2023-10-09 MED ORDER — ONDANSETRON 4 MG PO TBDP: 4.0000 mg | ORAL_TABLET | Freq: Three times a day (TID) | ORAL | 0 refills | Status: AC | PRN

## 2023-10-09 MED ORDER — ONDANSETRON HCL 4 MG/2ML IJ SOLN
4.0000 mg | Freq: Once | INTRAMUSCULAR | Status: AC
Start: 2023-10-09 — End: 2023-10-09
  Administered 2023-10-09: 4 mg via INTRAVENOUS
  Filled 2023-10-09: qty 2

## 2023-10-09 NOTE — Discharge Instructions (Addendum)
 Evaluated you for your near fainting episode.  Your laboratory testing was reassuring.  We feel that it is safe to go home at this time.  Please be sure you are drinking lots of fluid.  We have sent you additional nausea medication.  Since you have had increased vomiting with your increased dose of Wegovy, you may need a dose reduction.  Please discuss this with your primary doctor.  Please return if you have any new or worsening symptoms such as recurrent episodes, loss of consciousness, chest pain, difficulty breathing, racing heartbeat, or any other concerning symptoms.

## 2023-10-09 NOTE — ED Triage Notes (Signed)
 Here by POV from home for near syncope x2 today. First episode this am at work, checked out by EMS, VSS. Pt went home and had a second episode. Endorses transient light headed, near syncope, vision change and ataxia. Reports 2d h/o NVD. Denies fever, sob, CP, or other pain. Mentions her wegovy medication was recently increased. Alert, NAD, calm, interactive, steady gait.

## 2023-10-09 NOTE — ED Provider Notes (Signed)
 [place] EMERGENCY DEPARTMENT AT [facility] Provider Note  CSN: [id number] Arrival date & time: 10/09/23 1621  Chief Complaint(s) Near Syncope  HPI Summer Carpenter is a 58 y.o. female history of hypertension, hyperlipidemia presenting to the emergency department with episode of near syncope.  Patient reports earlier at work had episode where she got tunnel vision, felt dizzy, nausea, lightheaded, lasted briefly.  Paramedics were called, checked patient, reportedly was back to baseline and declined ER transport.  Happened again while she was at home with similar episode.  Recent episode occurred after standing up.  Episode at home she was sitting.  No loss of consciousness, second episode also lasted briefly.  She reports that over the past week, had a week OB dose increased and has been having around 2 days of diarrhea, nausea and vomiting.  No melena, hematochezia, hematemesis.  Has been taking some oral nausea medication but not helping much and continued vomiting.  No fevers or chills.  No chest pain, shortness of breath, palpitations during episodes.  No fall or head injury or other injury.,  Currently feels at baseline besides nausea.  No abdominal pain   Past Medical History Past Medical History:  Diagnosis Date   Anxiety    Arthritis    GERD (gastroesophageal reflux disease)    History of echocardiogram    a. 12/2012: EF 55-60%, no WMA, trivial TR   Hyperlipidemia    Hypertension    Obesity    Palpitations    Prediabetes    Sleep apnea    Patient Active Problem List   Diagnosis Date Noted   Stress 11/15/2020   Encounter for gynecological examination with Papanicolaou smear of cervix 11/11/2019   Encounter for screening fecal occult blood testing 11/11/2019   Spotting 11/11/2019   Fecal occult blood test positive 02/26/2018   Screening for colorectal cancer 02/26/2018   Encounter for well woman exam with routine gynecological exam 02/26/2018   DUB  (dysfunctional uterine bleeding) 02/26/2018   Normal coronary arteries 10/09/2016   Chest pain at rest 10/09/2016   Hypokalemia 08/28/2013   Anemia 08/28/2013   mild OSA (obstructive sleep apnea) 12/22/2012   Fatigue 10/08/2012   Snoring 10/08/2012   Palpitations 09/14/2012   Essential hypertension 09/14/2012   Hyperlipidemia 09/14/2012   Obesity (BMI 35.0-39.9 without comorbidity) 09/14/2012   Home Medication(s) Prior to Admission medications   Medication Sig Start Date End Date Taking? Authorizing Provider  ondansetron  (ZOFRAN -ODT) 4 MG disintegrating tablet Take 1 tablet (4 mg total) by mouth every 8 (eight) hours as needed for nausea or vomiting. 10/09/23  Yes Francesca Elsie CROME, MD  acetaminophen  (TYLENOL ) 500 MG tablet Take 1,000 mg by mouth every 6 (six) hours as needed for mild pain or moderate pain.    [provider]  aspirin  EC 81 MG tablet Take 81 mg by mouth daily.      [provider]  diclofenac  Sodium (VOLTAREN ) 1 % GEL Apply 2 g topically 4 (four) times daily. 07/31/23   Suellen Cantor A, PA-C  LINZESS 145 MCG CAPS capsule Take 145 mcg by mouth daily as needed (for constipation). 02/21/18   [provider]  losartan -hydrochlorothiazide (HYZAAR) 50-12.5 MG tablet TAKE 1 TABLET BY MOUTH EVERY DAY 05/25/22   Croitoru, Mihai, MD  omeprazole (PRILOSEC) 40 MG capsule Take 40 mg by mouth daily. 09/30/19   [provider]  oxyCODONE -acetaminophen  (PERCOCET) 5-325 MG tablet Take 1-2 tablets by mouth every 6 (six) hours as needed for severe pain.  Max 6 tabs per day 11/08/21   Ashley Soulier, DPM  potassium chloride  (K-DUR) 10 MEQ tablet TAKE 1 TABLET BY MOUTH EVERY DAY 08/04/18   Croitoru, Mihai, MD  propranolol  (INDERAL ) 20 MG tablet TAKE 1 TABLET BY MOUTH EVERY DAY AS NEEDED PALPITATIONS 06/08/20   Croitoru, Jerel, MD  propranolol  ER (INDERAL  LA) 120 MG 24 hr capsule Take 1 capsule (120 mg total) by mouth daily. Patient taking differently: Take 120 mg  by mouth daily as needed. 05/04/20   Croitoru, Mihai, MD  Semaglutide-Weight Management (WEGOVY) 0.25 MG/0.5ML SOAJ Inject 0.25 mg into the skin once a week. Sunday    [provider]  sertraline (ZOLOFT) 100 MG tablet Take 100 mg by mouth every evening. 03/27/18   [provider]  simvastatin (ZOCOR) 10 MG tablet Take 10 mg by mouth daily.    [provider]  valACYclovir  (VALTREX ) 1000 MG tablet TAKE 1 TABLET BY MOUTH EVERY DAY AS NEEDED 08/29/23   Jayne Vonn DEL, MD                                                                                                                                    Past Surgical History Past Surgical History:  Procedure Laterality Date   ARTHRODESIS METATARSAL Right 11/08/2021   Procedure: ARTHRODESIS; LISFRANC; MULTIPLE;  Surgeon: Ashley Soulier, DPM;  Location: [facility];  Service: Podiatry;  Laterality: Right;  Second and Third Metatarsal Cuneiform   BUNIONECTOMY Right 11/08/2021   Procedure: AUSTIN;  Surgeon: Ashley Soulier, DPM;  Location: [facility];  Service: Podiatry;  Laterality: Right;  GENERAL WITH LOCAL   CARDIAC SURGERY     catheterization in 2002   CERVICAL POLYPECTOMY N/A 12/15/2014   Procedure: ENDOMETRIAL POLYPECTOMY;  Surgeon: Vonn DEL Jayne, MD;  Location: [facility];  Service: Gynecology;  Laterality: N/A;   CHOLECYSTECTOMY     DILITATION & CURRETTAGE/HYSTROSCOPY WITH NOVASURE ABLATION N/A 12/15/2014   Procedure: DILATATION & CURETTAGE/HYSTEROSCOPY WITH ENDOMETRIAL NOVASURE ABLATION;  Surgeon: Vonn DEL Jayne, MD;  Location: [facility];  Service: Gynecology;  Laterality: N/A;   DOPPLER ECHOCARDIOGRAPHY  07/30/2000 [place]   EF 55-66%,lv normal   NM MYOCAR PERF WALL MOTION  10/22/2011   OOPHORECTOMY  2008   lt   Family History Family History  Problem Relation Age of Onset   Hypertension Mother    Heart attack Mother    Seizures Father    Hypertension Brother    Hypertension Brother    Hypertension  Sister    Early death Brother    Hypertension Son     Social History Social History   Tobacco Use   Smoking status: Never   Smokeless tobacco: Never  Vaping Use   Vaping status: Never Used  Substance Use Topics   Alcohol use: Yes    Comment: Occasionally, once a month   Drug use: No   Allergies Septra [bactrim] and Sulfamethoxazole-trimethoprim  Review  of Systems Review of Systems  All other systems reviewed and are negative.   Physical Exam Vital Signs  I have reviewed the triage vital signs BP (!) 140/89   Pulse 90   Temp 98.2 F (36.8 C) (Oral)   Resp (!) 24   Ht 5' 7 (1.702 m)   Wt 102.1 kg   LMP 09/28/2019   SpO2 93%   BMI 35.24 kg/m  Physical Exam Vitals and nursing note reviewed.  Constitutional:      General: She is not in acute distress.    Appearance: She is well-developed.  HENT:     Head: Normocephalic and atraumatic.     Mouth/Throat:     Mouth: Mucous membranes are moist.  Eyes:     Pupils: Pupils are equal, round, and reactive to light.  Cardiovascular:     Rate and Rhythm: Normal rate and regular rhythm.     Heart sounds: No murmur heard. Pulmonary:     Effort: Pulmonary effort is normal. No respiratory distress.     Breath sounds: Normal breath sounds.  Abdominal:     General: Abdomen is flat.     Palpations: Abdomen is soft.     Tenderness: There is no abdominal tenderness.  Musculoskeletal:        General: No tenderness.     Right lower leg: No edema.     Left lower leg: No edema.  Skin:    General: Skin is warm and dry.  Neurological:     General: No focal deficit present.     Mental Status: She is alert. Mental status is at baseline.  Psychiatric:        Mood and Affect: Mood normal.        Behavior: Behavior normal.     ED Results and Treatments Labs (all labs ordered are listed, but only abnormal results are displayed) Labs Reviewed  COMPREHENSIVE METABOLIC PANEL WITH GFR - Abnormal; Notable for the following  components:      Result Value   Potassium 3.4 (*)    Creatinine, Ser 1.03 (*)    Total Bilirubin 1.3 (*)    All other components within normal limits  URINALYSIS, ROUTINE W REFLEX MICROSCOPIC - Abnormal; Notable for the following components:   APPearance HAZY (*)    Ketones, ur 5 (*)    Protein, ur 30 (*)    Leukocytes,Ua TRACE (*)    Bacteria, UA RARE (*)    All other components within normal limits  CBC WITH DIFFERENTIAL/PLATELET  CBG MONITORING, ED                                                                                                                          Radiology No results found.  Pertinent labs & imaging results that were available during my care of the patient were reviewed by me and considered in my medical decision making (see MDM for details).  Medications Ordered in ED Medications  potassium chloride   SA (KLOR-CON  M) CR tablet 40 mEq (has no administration in time range)  sodium chloride  0.9 % bolus 1,000 mL (0 mLs Intravenous Stopped 10/09/23 1851)  ondansetron  (ZOFRAN ) injection 4 mg (4 mg Intravenous Given 10/09/23 1727)                                                                                                                                     Procedures Procedures  (including critical care time)  Medical Decision Making / ED Course   MDM:  58 year old presenting to the emergency department with near syncope x 2.  Patient overall well-appearing, physical examination without focal finding.  Lungs clear, no abdominal tenderness, regular rhythm.  EKG shows sinus rhythm.  Suspect likely near syncope in the setting of dehydration given persistent nausea, vomiting.  Likely related to increased Wegovy dose.  No abdominal pain or tenderness on exam.  Lower concern for cardiac cause, no palpitations, chest pain, EKG shows sinus rhythm.  Symptoms not consistent with seizure or neurologic process.  No headaches.  Will check orthostatic vital signs though  these are not reliable in general.  Will give IV fluids and Zofran  and reassess.  Will check labs to evaluate for other signs of dehydration or metabolic abnormality from vomiting.  Clinical Course as of 10/09/23 1851  Wed Oct 09, 2023  1850 Patient feels better after fluids, Zofran .  Was able to get up without having recurrent near syncopal episode.  Urinalysis contaminated with multiple squamous cells.  Labs with borderline hypokalemia, will replete. Will discharge patient to home. All questions answered. Patient comfortable with plan of discharge. Return precautions discussed with patient and specified on the after visit summary.  [WS]    Clinical Course User Index [WS] Francesca Elsie CROME, MD     Additional history obtained: -Additional history obtained from ems -External records from outside source obtained and reviewed including: Chart review including previous notes, labs, imaging, consultation notes including prior notes    Lab Tests: -I ordered, reviewed, and interpreted labs.   The pertinent results include:   Labs Reviewed  COMPREHENSIVE METABOLIC PANEL WITH GFR - Abnormal; Notable for the following components:      Result Value   Potassium 3.4 (*)    Creatinine, Ser 1.03 (*)    Total Bilirubin 1.3 (*)    All other components within normal limits  URINALYSIS, ROUTINE W REFLEX MICROSCOPIC - Abnormal; Notable for the following components:   APPearance HAZY (*)    Ketones, ur 5 (*)    Protein, ur 30 (*)    Leukocytes,Ua TRACE (*)    Bacteria, UA RARE (*)    All other components within normal limits  CBC WITH DIFFERENTIAL/PLATELET  CBG MONITORING, ED    Notable for mild hypokalemia   EKG   EKG Interpretation Date/Time:  Wednesday October 09 2023 16:42:18 EDT Ventricular Rate:  77 PR Interval:  131 QRS Duration:  109  QT Interval:  431 QTC Calculation: 488 R Axis:   26  Text Interpretation: Sinus rhythm RSR' in V1 or V2, right VCD or RVH Borderline prolonged QT  interval Confirmed by Francesca Fallow ([id number]) on 10/09/2023 5:00:03 PM         Medicines ordered and prescription drug management: Meds ordered this encounter  Medications   sodium chloride  0.9 % bolus 1,000 mL   ondansetron  (ZOFRAN ) injection 4 mg   ondansetron  (ZOFRAN -ODT) 4 MG disintegrating tablet    Sig: Take 1 tablet (4 mg total) by mouth every 8 (eight) hours as needed for nausea or vomiting.    Dispense:  20 tablet    Refill:  0   potassium chloride  SA (KLOR-CON  M) CR tablet 40 mEq    -I have reviewed the patients home medicines and have made adjustments as needed  Social Determinants of Health:  Diagnosis or treatment significantly limited by social determinants of health: obesity   Reevaluation: After the interventions noted above, I reevaluated the patient and found that their symptoms have improved  Co morbidities that complicate the patient evaluation  Past Medical History:  Diagnosis Date   Anxiety    Arthritis    GERD (gastroesophageal reflux disease)    History of echocardiogram    a. 12/2012: EF 55-60%, no WMA, trivial TR   Hyperlipidemia    Hypertension    Obesity    Palpitations    Prediabetes    Sleep apnea       Dispostion: Disposition decision including need for hospitalization was considered, and patient discharged from emergency department.    Final Clinical Impression(s) / ED Diagnoses Final diagnoses:  Near syncope     This chart was dictated using voice recognition software.  Despite best efforts to proofread,  errors can occur which can change the documentation meaning.    Francesca Fallow CROME, MD 10/09/23 [phone]

## 2023-11-12 ENCOUNTER — Other Ambulatory Visit (HOSPITAL_COMMUNITY): Payer: Self-pay | Admitting: Adult Health

## 2023-11-12 DIAGNOSIS — Z1231 Encounter for screening mammogram for malignant neoplasm of breast: Secondary | ICD-10-CM

## 2023-12-23 ENCOUNTER — Encounter (HOSPITAL_COMMUNITY): Payer: Self-pay

## 2023-12-23 ENCOUNTER — Ambulatory Visit (HOSPITAL_COMMUNITY)
Admission: RE | Admit: 2023-12-23 | Discharge: 2023-12-23 | Disposition: A | Discharge status: Home / Self Care | Source: Ambulatory Visit | Attending: Adult Health | Admitting: Adult Health

## 2023-12-23 DIAGNOSIS — Z1231 Encounter for screening mammogram for malignant neoplasm of breast: Secondary | ICD-10-CM | POA: Insufficient documentation

## 2023-12-25 ENCOUNTER — Ambulatory Visit: Payer: Self-pay | Admitting: Adult Health

## 2024-02-20 ENCOUNTER — Ambulatory Visit (INDEPENDENT_AMBULATORY_CARE_PROVIDER_SITE_OTHER): Admitting: Adult Health

## 2024-02-20 ENCOUNTER — Encounter: Payer: Self-pay | Admitting: Adult Health

## 2024-02-20 ENCOUNTER — Other Ambulatory Visit (HOSPITAL_COMMUNITY)
Admission: RE | Admit: 2024-02-20 | Discharge: 2024-02-20 | Disposition: A | Discharge status: Home / Self Care | Source: Ambulatory Visit | Attending: Adult Health | Admitting: Adult Health

## 2024-02-20 VITALS — BP 129/83 | HR 73 | Ht 67.0 in | Wt 226.5 lb

## 2024-02-20 DIAGNOSIS — Z01419 Encounter for gynecological examination (general) (routine) without abnormal findings: Secondary | ICD-10-CM

## 2024-02-20 DIAGNOSIS — L0292 Furuncle, unspecified: Secondary | ICD-10-CM | POA: Diagnosis not present

## 2024-02-20 DIAGNOSIS — Z1331 Encounter for screening for depression: Secondary | ICD-10-CM | POA: Diagnosis not present

## 2024-02-20 MED ORDER — FLUCONAZOLE 150 MG PO TABS: ORAL_TABLET | ORAL | 1 refills | Status: AC

## 2024-02-20 MED ORDER — DOXYCYCLINE HYCLATE 100 MG PO TABS: 100.0000 mg | ORAL_TABLET | Freq: Two times a day (BID) | ORAL | 0 refills | Status: AC

## 2024-02-20 NOTE — Progress Notes (Signed)
 [name], female   DOB: 10-26-1965, 58 y.o.   MRN: [id number] History of Present Illness: Summer Carpenter is a 58 year old black female,single, PM in for a well woman gyn exam and pap.  PCP is [facility]   Current Medications, Allergies, Past Medical History, Past Surgical History, Family History and Social History were reviewed in [employer] record.     Review of Systems: Patient denies any headaches, hearing loss, fatigue, blurred vision, shortness of breath, chest pain, abdominal pain, problems with bowel movements, urination, or intercourse(not active). No joint pain or mood swings.  Denies any vaginal bleeding Has ?boil on mons pubis   Physical Exam:BP 129/83 (BP Location: Left Arm, Patient Position: Sitting, Cuff Size: Large)   Pulse 73   Ht 5' 7 (1.702 m)   Wt 226 lb 8 oz (102.7 kg)   LMP 09/28/2019   BMI 35.47 kg/m   General:  Well developed, well nourished, no acute distress Skin:  Warm and dry Neck:  Midline trachea, normal thyroid , good ROM, no lymphadenopathy Lungs; Clear to auscultation bilaterally Breast:  No dominant palpable mass, retraction, or nipple discharge Cardiovascular: Regular rate and rhythm Abdomen:  Soft, non tender, no hepatosplenomegaly Pelvic:  External genitalia is normal in appearance, has purple 2 cm firm area on mons pubis has squeezed  The vagina is normal in appearance. Urethra has no lesions or masses. 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  Uterus is felt to be normal size, shape, and contour.  No adnexal masses or tenderness noted.Bladder is non tender, no masses felt. Rectal: Deferred Extremities/musculoskeletal:  No swelling or varicosities noted, no clubbing or cyanosis Psych:  No mood changes, alert and cooperative,seems happy AA is 2 Fall risk is low    02/20/2024    1:37 PM 11/15/2020    8:38 AM 11/11/2019    9:38 AM  Depression screen PHQ 2/9  Decreased Interest 0 3 1  Down,  Depressed, Hopeless 0 2 1  PHQ - 2 Score 0 5 2  Altered sleeping 1 2 1   Tired, decreased energy 1 3 2   Change in appetite 0 1 1  Feeling bad or failure about yourself  1 1 1   Trouble concentrating 1 1 0  Moving slowly or fidgety/restless 0 0 0  Suicidal thoughts 0 0 0  PHQ-9 Score 4 13  7    Difficult doing work/chores   Not difficult at all     Data saved with a previous flowsheet row definition       02/20/2024    1:37 PM 11/15/2020    8:38 AM 11/11/2019    9:38 AM  GAD 7 : Generalized Anxiety Score  Nervous, Anxious, on Edge 1 2 1   Control/stop worrying 0 3 1  Worry too much - different things 1 3 1   Trouble relaxing 0 2 1  Restless 0 1 0  Easily annoyed or irritable 1 2 1   Afraid - awful might happen 1 3 1   Total GAD 7 Score 4 16 6   Anxiety Difficulty   Not difficult at all      Upstream - 02/20/24 1335       Pregnancy Intention Screening   Does the patient want to become pregnant in the next year? N/A    Does the patient's partner want to become pregnant in the next year? N/A    Would the patient like to discuss contraceptive options today? N/A  Contraception Wrap Up   Current Method Abstinence;Post-Menopause    End Method Abstinence;Post-Menopause    Contraception Counseling Provided No         Examination chaperoned by Clarita Salt LPN   Impression and Plan: 1. Encounter for gynecological examination with Papanicolaou smear of cervix (Primary) Pap sent Pap in 3 years if negative Get physical with PCP Labs with PCP Mammogram was negative 12/23/23 Colonoscopy per GI  - Cytology - PAP( [place])  2. Boil Do not squeeze Will rx doxycycline  100 mg 1 bid Meds ordered this encounter  Medications   doxycycline  (VIBRA -TABS) 100 MG tablet    Sig: Take 1 tablet (100 mg total) by mouth 2 (two) times daily.    Dispense:  20 tablet    Refill:  0    Supervising Provider:   JAYNE MINDER H [[id number]]   fluconazole  (DIFLUCAN ) 150 MG tablet    Sig: Take 1 now  and repeat 1 in 3 days. DO NOT take with ZOCOR    Dispense:  2 tablet    Refill:  1    Supervising Provider:   JAYNE MINDER DEL [[id number]]   And she requests diflucan  Can use warm compress

## 2024-02-24 ENCOUNTER — Ambulatory Visit: Payer: Self-pay | Admitting: Adult Health

## 2024-02-24 LAB — CYTOLOGY - PAP
Adequacy: ABSENT
Comment: NEGATIVE
Diagnosis: NEGATIVE
High risk HPV: NEGATIVE

## 2024-02-24 MED ORDER — METRONIDAZOLE 500 MG PO TABS: 500.0000 mg | ORAL_TABLET | Freq: Two times a day (BID) | ORAL | 0 refills | Status: AC
